# Patient Record
Sex: Male | Born: 1945
Health system: Southern US, Community
[De-identification: ages and names within clinical notes are randomized; demographics above are authoritative.]

## PROBLEM LIST (undated history)

## (undated) DIAGNOSIS — Z7901 Long term (current) use of anticoagulants: Secondary | ICD-10-CM

## (undated) DIAGNOSIS — I10 Essential (primary) hypertension: Secondary | ICD-10-CM

## (undated) DIAGNOSIS — D61818 Other pancytopenia: Secondary | ICD-10-CM

## (undated) DIAGNOSIS — I712 Thoracic aortic aneurysm, without rupture: Secondary | ICD-10-CM

## (undated) DIAGNOSIS — N200 Calculus of kidney: Secondary | ICD-10-CM

## (undated) DIAGNOSIS — D649 Anemia, unspecified: Secondary | ICD-10-CM

## (undated) DIAGNOSIS — T7840XA Allergy, unspecified, initial encounter: Secondary | ICD-10-CM

## (undated) DIAGNOSIS — M62830 Muscle spasm of back: Secondary | ICD-10-CM

## (undated) DIAGNOSIS — Q231 Congenital insufficiency of aortic valve: Secondary | ICD-10-CM

## (undated) DIAGNOSIS — Q2381 Bicuspid aortic valve: Secondary | ICD-10-CM

## (undated) HISTORY — DX: Allergy, unspecified, initial encounter: T78.40XA

## (undated) HISTORY — PX: SHOULDER SURGERY: SHX246

## (undated) HISTORY — PX: OTHER SURGICAL HISTORY: SHX169

## (undated) HISTORY — DX: Muscle spasm of back: M62.830

## (undated) HISTORY — PX: COLONOSCOPY: SHX174

## (undated) HISTORY — DX: Congenital insufficiency of aortic valve: Q23.1

## (undated) HISTORY — DX: Anemia, unspecified: D64.9

## (undated) HISTORY — DX: Calculus of kidney: N20.0

## (undated) HISTORY — DX: Other pancytopenia: D61.818

## (undated) HISTORY — DX: Bicuspid aortic valve: Q23.81

## (undated) HISTORY — PX: EYE SURGERY: SHX253

## (undated) HISTORY — DX: Essential (primary) hypertension: I10

---

## 2004-03-23 ENCOUNTER — Ambulatory Visit: Payer: Self-pay | Admitting: Internal Medicine

## 2004-04-02 ENCOUNTER — Ambulatory Visit: Payer: Self-pay | Admitting: Internal Medicine

## 2004-04-02 ENCOUNTER — Ambulatory Visit (HOSPITAL_COMMUNITY): Admission: RE | Admit: 2004-04-02 | Discharge: 2004-04-02 | Payer: Self-pay | Admitting: Internal Medicine

## 2005-07-08 ENCOUNTER — Ambulatory Visit (HOSPITAL_COMMUNITY): Admission: RE | Admit: 2005-07-08 | Discharge: 2005-07-08 | Payer: Self-pay | Admitting: Specialist

## 2005-11-02 ENCOUNTER — Emergency Department (HOSPITAL_COMMUNITY): Admission: EM | Admit: 2005-11-02 | Discharge: 2005-11-02 | Payer: Self-pay | Admitting: Emergency Medicine

## 2006-05-27 ENCOUNTER — Emergency Department (HOSPITAL_COMMUNITY): Admission: EM | Admit: 2006-05-27 | Discharge: 2006-05-27 | Payer: Self-pay | Admitting: Emergency Medicine

## 2006-09-25 ENCOUNTER — Ambulatory Visit (HOSPITAL_COMMUNITY): Admission: RE | Admit: 2006-09-25 | Discharge: 2006-09-25 | Payer: Self-pay | Admitting: Orthopaedic Surgery

## 2007-02-22 HISTORY — PX: BACK SURGERY: SHX140

## 2008-02-11 ENCOUNTER — Encounter: Payer: Self-pay | Admitting: Pulmonary Disease

## 2008-02-11 ENCOUNTER — Inpatient Hospital Stay (HOSPITAL_COMMUNITY): Admission: AD | Admit: 2008-02-11 | Discharge: 2008-02-14 | Payer: Self-pay | Admitting: Specialist

## 2008-02-22 DIAGNOSIS — I712 Thoracic aortic aneurysm, without rupture: Secondary | ICD-10-CM

## 2008-02-22 DIAGNOSIS — I7121 Aneurysm of the ascending aorta, without rupture: Secondary | ICD-10-CM

## 2008-02-22 HISTORY — DX: Aneurysm of the ascending aorta, without rupture: I71.21

## 2008-02-22 HISTORY — DX: Thoracic aortic aneurysm, without rupture: I71.2

## 2008-02-22 HISTORY — PX: CARDIAC VALVE REPLACEMENT: SHX585

## 2008-03-10 ENCOUNTER — Emergency Department (HOSPITAL_COMMUNITY): Admission: EM | Admit: 2008-03-10 | Discharge: 2008-03-10 | Payer: Self-pay | Admitting: Emergency Medicine

## 2008-03-13 ENCOUNTER — Ambulatory Visit (HOSPITAL_COMMUNITY): Admission: RE | Admit: 2008-03-13 | Discharge: 2008-03-13 | Payer: Self-pay | Admitting: Internal Medicine

## 2008-04-12 ENCOUNTER — Emergency Department (HOSPITAL_COMMUNITY): Admission: EM | Admit: 2008-04-12 | Discharge: 2008-04-12 | Payer: Self-pay | Admitting: Emergency Medicine

## 2008-04-16 ENCOUNTER — Ambulatory Visit (HOSPITAL_COMMUNITY): Admission: RE | Admit: 2008-04-16 | Discharge: 2008-04-17 | Payer: Self-pay | Admitting: Urology

## 2008-04-16 ENCOUNTER — Encounter: Payer: Self-pay | Admitting: Emergency Medicine

## 2008-04-19 ENCOUNTER — Emergency Department (HOSPITAL_COMMUNITY): Admission: EM | Admit: 2008-04-19 | Discharge: 2008-04-20 | Payer: Self-pay | Admitting: Emergency Medicine

## 2008-07-07 ENCOUNTER — Ambulatory Visit: Payer: Self-pay | Admitting: Cardiology

## 2008-07-07 ENCOUNTER — Ambulatory Visit (HOSPITAL_COMMUNITY): Admission: RE | Admit: 2008-07-07 | Discharge: 2008-07-07 | Payer: Self-pay | Admitting: Cardiology

## 2008-07-08 ENCOUNTER — Encounter: Payer: Self-pay | Admitting: Cardiology

## 2008-07-08 ENCOUNTER — Ambulatory Visit: Payer: Self-pay | Admitting: Cardiovascular Disease

## 2008-07-08 ENCOUNTER — Ambulatory Visit (HOSPITAL_COMMUNITY): Admission: RE | Admit: 2008-07-08 | Discharge: 2008-07-08 | Payer: Self-pay | Admitting: Cardiology

## 2008-07-11 ENCOUNTER — Ambulatory Visit: Payer: Self-pay | Admitting: Cardiology

## 2008-07-11 ENCOUNTER — Inpatient Hospital Stay (HOSPITAL_BASED_OUTPATIENT_CLINIC_OR_DEPARTMENT_OTHER): Admission: RE | Admit: 2008-07-11 | Discharge: 2008-07-11 | Payer: Self-pay | Admitting: Cardiology

## 2008-07-15 ENCOUNTER — Ambulatory Visit: Payer: Self-pay | Admitting: Surgery

## 2008-07-15 ENCOUNTER — Encounter: Payer: Self-pay | Admitting: Cardiology

## 2008-07-18 ENCOUNTER — Encounter: Payer: Self-pay | Admitting: Surgery

## 2008-07-18 ENCOUNTER — Ambulatory Visit: Payer: Self-pay | Admitting: *Deleted

## 2008-07-18 ENCOUNTER — Ambulatory Visit (HOSPITAL_COMMUNITY): Admission: RE | Admit: 2008-07-18 | Discharge: 2008-07-18 | Payer: Self-pay | Admitting: Surgery

## 2008-07-25 ENCOUNTER — Ambulatory Visit: Payer: Self-pay | Admitting: Surgery

## 2008-07-25 ENCOUNTER — Encounter: Payer: Self-pay | Admitting: Surgery

## 2008-07-25 ENCOUNTER — Inpatient Hospital Stay (HOSPITAL_COMMUNITY): Admission: RE | Admit: 2008-07-25 | Discharge: 2008-07-30 | Payer: Self-pay | Admitting: Surgery

## 2008-08-04 ENCOUNTER — Ambulatory Visit: Payer: Self-pay | Admitting: Cardiology

## 2008-08-08 DIAGNOSIS — I10 Essential (primary) hypertension: Secondary | ICD-10-CM | POA: Insufficient documentation

## 2008-08-08 DIAGNOSIS — N200 Calculus of kidney: Secondary | ICD-10-CM | POA: Insufficient documentation

## 2008-08-11 ENCOUNTER — Encounter: Payer: Self-pay | Admitting: Physician Assistant

## 2008-08-11 ENCOUNTER — Encounter (INDEPENDENT_AMBULATORY_CARE_PROVIDER_SITE_OTHER): Payer: Self-pay | Admitting: *Deleted

## 2008-08-11 ENCOUNTER — Ambulatory Visit: Payer: Self-pay | Admitting: Physician Assistant

## 2008-08-18 ENCOUNTER — Encounter: Payer: Self-pay | Admitting: Cardiology

## 2008-08-18 ENCOUNTER — Encounter: Admission: RE | Admit: 2008-08-18 | Discharge: 2008-08-18 | Payer: Self-pay | Admitting: Surgery

## 2008-08-18 ENCOUNTER — Ambulatory Visit: Payer: Self-pay | Admitting: Surgery

## 2008-08-26 ENCOUNTER — Ambulatory Visit: Payer: Self-pay | Admitting: Cardiovascular Disease

## 2008-09-15 ENCOUNTER — Ambulatory Visit: Payer: Self-pay | Admitting: Cardiology

## 2008-10-06 ENCOUNTER — Encounter: Payer: Self-pay | Admitting: *Deleted

## 2008-10-20 ENCOUNTER — Ambulatory Visit: Payer: Self-pay | Admitting: Cardiology

## 2008-11-06 ENCOUNTER — Ambulatory Visit (HOSPITAL_COMMUNITY): Admission: RE | Admit: 2008-11-06 | Discharge: 2008-11-06 | Payer: Self-pay | Admitting: Ophthalmology

## 2008-11-10 ENCOUNTER — Ambulatory Visit: Payer: Self-pay | Admitting: Cardiology

## 2008-11-20 ENCOUNTER — Ambulatory Visit (HOSPITAL_COMMUNITY): Admission: RE | Admit: 2008-11-20 | Discharge: 2008-11-20 | Payer: Self-pay | Admitting: Ophthalmology

## 2008-12-11 ENCOUNTER — Ambulatory Visit: Payer: Self-pay | Admitting: Cardiology

## 2008-12-11 LAB — CONVERTED CEMR LAB: POC INR: 1.8

## 2008-12-18 ENCOUNTER — Ambulatory Visit: Payer: Self-pay | Admitting: Cardiology

## 2009-01-07 ENCOUNTER — Ambulatory Visit: Payer: Self-pay | Admitting: Cardiology

## 2009-02-12 ENCOUNTER — Ambulatory Visit: Payer: Self-pay | Admitting: Cardiology

## 2009-02-12 LAB — CONVERTED CEMR LAB: POC INR: 3.3

## 2009-02-18 ENCOUNTER — Ambulatory Visit (HOSPITAL_COMMUNITY): Admission: RE | Admit: 2009-02-18 | Discharge: 2009-02-18 | Payer: Self-pay | Admitting: Internal Medicine

## 2009-03-13 ENCOUNTER — Ambulatory Visit: Payer: Self-pay | Admitting: Cardiology

## 2009-03-13 LAB — CONVERTED CEMR LAB: POC INR: 3.5

## 2009-04-07 ENCOUNTER — Ambulatory Visit: Payer: Self-pay | Admitting: Cardiology

## 2009-04-07 LAB — CONVERTED CEMR LAB: POC INR: 2.9

## 2009-05-08 ENCOUNTER — Ambulatory Visit: Payer: Self-pay | Admitting: Cardiology

## 2009-05-14 ENCOUNTER — Telehealth: Payer: Self-pay | Admitting: Cardiology

## 2009-05-14 ENCOUNTER — Encounter (INDEPENDENT_AMBULATORY_CARE_PROVIDER_SITE_OTHER): Payer: Self-pay | Admitting: *Deleted

## 2009-05-20 ENCOUNTER — Telehealth (INDEPENDENT_AMBULATORY_CARE_PROVIDER_SITE_OTHER): Payer: Self-pay | Admitting: *Deleted

## 2009-06-04 ENCOUNTER — Telehealth (INDEPENDENT_AMBULATORY_CARE_PROVIDER_SITE_OTHER): Payer: Self-pay | Admitting: *Deleted

## 2009-06-11 ENCOUNTER — Ambulatory Visit: Payer: Self-pay | Admitting: Cardiology

## 2009-06-18 ENCOUNTER — Ambulatory Visit: Payer: Self-pay | Admitting: Cardiology

## 2009-06-18 LAB — CONVERTED CEMR LAB: POC INR: 4.4

## 2009-07-06 ENCOUNTER — Ambulatory Visit: Payer: Self-pay | Admitting: Cardiology

## 2009-07-06 DIAGNOSIS — Z952 Presence of prosthetic heart valve: Secondary | ICD-10-CM | POA: Insufficient documentation

## 2009-07-06 DIAGNOSIS — R002 Palpitations: Secondary | ICD-10-CM | POA: Insufficient documentation

## 2009-07-06 DIAGNOSIS — R42 Dizziness and giddiness: Secondary | ICD-10-CM | POA: Insufficient documentation

## 2009-07-06 DIAGNOSIS — R55 Syncope and collapse: Secondary | ICD-10-CM | POA: Insufficient documentation

## 2009-07-07 ENCOUNTER — Encounter: Payer: Self-pay | Admitting: Adult Health

## 2009-07-08 ENCOUNTER — Ambulatory Visit: Payer: Self-pay | Admitting: Cardiology

## 2009-07-08 ENCOUNTER — Ambulatory Visit (HOSPITAL_COMMUNITY): Admission: RE | Admit: 2009-07-08 | Discharge: 2009-07-08 | Payer: Self-pay | Admitting: Cardiology

## 2009-07-09 ENCOUNTER — Encounter: Payer: Self-pay | Admitting: Cardiology

## 2009-07-10 ENCOUNTER — Ambulatory Visit: Payer: Self-pay | Admitting: Cardiology

## 2009-07-15 ENCOUNTER — Telehealth: Payer: Self-pay | Admitting: Cardiology

## 2009-07-16 ENCOUNTER — Ambulatory Visit: Payer: Self-pay | Admitting: Cardiology

## 2009-07-23 ENCOUNTER — Ambulatory Visit: Payer: Self-pay | Admitting: Cardiology

## 2009-08-10 ENCOUNTER — Ambulatory Visit: Payer: Self-pay | Admitting: Cardiovascular Disease

## 2009-08-28 ENCOUNTER — Ambulatory Visit: Payer: Self-pay | Admitting: Cardiology

## 2009-09-25 ENCOUNTER — Ambulatory Visit: Payer: Self-pay | Admitting: Cardiology

## 2009-10-22 ENCOUNTER — Ambulatory Visit: Payer: Self-pay | Admitting: Cardiology

## 2009-10-22 LAB — CONVERTED CEMR LAB: POC INR: 2.3

## 2009-11-19 ENCOUNTER — Ambulatory Visit: Payer: Self-pay | Admitting: Cardiology

## 2009-12-18 ENCOUNTER — Ambulatory Visit: Payer: Self-pay | Admitting: Cardiology

## 2009-12-18 LAB — CONVERTED CEMR LAB: POC INR: 2.2

## 2010-01-01 ENCOUNTER — Ambulatory Visit: Payer: Self-pay | Admitting: Cardiology

## 2010-01-01 LAB — CONVERTED CEMR LAB: POC INR: 3.6

## 2010-01-22 ENCOUNTER — Ambulatory Visit: Payer: Self-pay | Admitting: Cardiology

## 2010-01-22 LAB — CONVERTED CEMR LAB: POC INR: 2.5

## 2010-02-19 ENCOUNTER — Ambulatory Visit: Payer: Self-pay | Admitting: Cardiology

## 2010-03-19 ENCOUNTER — Ambulatory Visit
Admission: RE | Admit: 2010-03-19 | Discharge: 2010-03-19 | Payer: Self-pay | Source: Home / Self Care | Attending: Cardiology | Admitting: Cardiology

## 2010-03-23 NOTE — Medication Information (Signed)
Summary: ccr-lr  Anticoagulant Therapy  Managed by: Vashti Hey, RN PCP: Forest Becker Supervising MD: Diona Browner MD, Remi Deter Indication 1: Aortic Valve Replacement (ICD-V43.3) Lab Used: [facility] [place] Site: [place] INR POC 3.5  Dietary changes: no    Health status changes: no    Bleeding/hemorrhagic complications: no    Recent/future hospitalizations: no    Any changes in medication regimen? no    Recent/future dental: no  Any missed doses?: no       Is patient compliant with meds? yes       Allergies: 1)  ! Codeine  Anticoagulation Management History:      The patient is taking warfarin and comes in today for a routine follow up visit.  Negative risk factors for bleeding include an age less than 40 years old.  The bleeding index is 'low risk'.  Positive CHADS2 values include History of HTN.  Negative CHADS2 values include Age > 74 years old.  The start date was 08/04/2008.  Anticoagulation responsible provider: Diona Browner MD, Remi Deter.  INR POC: 3.5.  Cuvette Lot#: [id number].  Exp: 10/11.    Anticoagulation Management Assessment/Plan:      The patient's current anticoagulation dose is Warfarin sodium 10 mg tabs: Use as directed by Anticoagulation Clinic.  The target INR is 2.5 - 3.5.  The next INR is due 04/10/2009.  Anticoagulation instructions were given to patient.  Results were reviewed/authorized by Vashti Hey, RN.  He was notified by Vashti Hey RN.         Prior Anticoagulation Instructions: INR 3.3 Continue coumadin 5mg  once daily except 10mg  on Wednesdays  Current Anticoagulation Instructions: INR 3.5 Continue coumadin 5mg  once daily except 10mg  on Wednesdays

## 2010-03-23 NOTE — Medication Information (Signed)
Summary: ccr-lr  Anticoagulant Therapy  Managed by: Vashti Hey, RN PCP: Forest Becker Supervising MD: Dietrich Pates MD, Molly Maduro Indication 1: Aortic Valve Replacement (ICD-V43.3) Lab Used: [facility] [place] Site: [place] INR POC 4.4  Dietary changes: no    Health status changes: no    Bleeding/hemorrhagic complications: no    Recent/future hospitalizations: no    Any changes in medication regimen? no    Recent/future dental: no  Any missed doses?: no       Is patient compliant with meds? yes       Allergies: 1)  ! Codeine  Anticoagulation Management History:      The patient is taking warfarin and comes in today for a routine follow up visit.  Negative risk factors for bleeding include an age less than 63 years old.  The bleeding index is 'low risk'.  Positive CHADS2 values include History of HTN.  Negative CHADS2 values include Age > 69 years old.  The start date was 08/04/2008.  Anticoagulation responsible provider: Dietrich Pates MD, Molly Maduro.  INR POC: 4.4.  Cuvette Lot#: [id number].  Exp: 10/11.    Anticoagulation Management Assessment/Plan:      The patient's current anticoagulation dose is Warfarin sodium 10 mg tabs: Use as directed by Anticoagulation Clinic.  The target INR is 2.5 - 3.5.  The next INR is due 07/06/2009.  Anticoagulation instructions were given to patient.  Results were reviewed/authorized by Vashti Hey, RN.  He was notified by Vashti Hey RN.         Prior Anticoagulation Instructions: INR 1.7 Take coumadin 1 tablet tonight and tomorrow night then resume 1/2 tablet once daily except 1 tablet on Wednesdays  Current Anticoagulation Instructions: INR 4.4 Hold coumadin tonight  then resume 5mg  once daily except 10mg  on Wednesdays

## 2010-03-23 NOTE — Medication Information (Signed)
Summary: ccr-lr  Anticoagulant Therapy  Managed by: Vashti Hey, RN PCP: Forest Becker Supervising MD: Dietrich Pates MD, Molly Maduro Indication 1: Aortic Valve Replacement (ICD-V43.3) Lab Used: [facility] [place] Site: [place] INR POC 2.6  Dietary changes: no    Health status changes: no    Bleeding/hemorrhagic complications: no    Recent/future hospitalizations: no    Any changes in medication regimen? no    Recent/future dental: no  Any missed doses?: no       Is patient compliant with meds? yes       Allergies: 1)  ! Codeine  Anticoagulation Management History:      The patient is taking warfarin and comes in today for a routine follow up visit.  Negative risk factors for bleeding include an age less than 57 years old.  The bleeding index is 'low risk'.  Positive CHADS2 values include History of HTN.  Negative CHADS2 values include Age > 65 years old.  The start date was 08/04/2008.  Anticoagulation responsible provider: Dietrich Pates MD, Molly Maduro.  INR POC: 2.6.  Cuvette Lot#: [id number].  Exp: 10/2010.    Anticoagulation Management Assessment/Plan:      The patient's current anticoagulation dose is Warfarin sodium 10 mg tabs: Use as directed by Anticoagulation Clinic.  The target INR is 2.5 - 3.5.  The next INR is due 12/17/2009.  Anticoagulation instructions were given to patient.  Results were reviewed/authorized by Vashti Hey, RN.  He was notified by Vashti Hey RN.         Prior Anticoagulation Instructions: INR 2.3 Take coumadin 1 tablet tonight then resume 1/2 tablet once daily except 1 tablet on Wednesdays  Current Anticoagulation Instructions: INR 2.6 Continue coumadin 1/2 tablet once daily except 1 tablet on Wednesdays

## 2010-03-23 NOTE — Medication Information (Signed)
Summary: ccr-lr  Anticoagulant Therapy  Managed by: Weston Brass, PharmD PCP: Forest Becker Supervising MD: Eden Emms MD, Theron Arista Indication 1: Aortic Valve Replacement (ICD-V43.3) Lab Used: [facility] [place] Site: [place] INR POC 3.0  Dietary changes: no    Health status changes: yes       Details: pt having recurrent chest pains for several months.  Has been seeing MD for this.  Had an episode while in clinic today.  BP was 124/67.  HR 82.  Often associated with diaphoresis and sometimes nausea.  Dr. Eden Emms reviewed pt information.  Negative CAD.  Has appt with Dr. Daleen Squibb in 2 weeks.    Bleeding/hemorrhagic complications: no    Recent/future hospitalizations: no    Any changes in medication regimen? no    Recent/future dental: no  Any missed doses?: yes     Details: was off for colonoscopy on 6/8.    Is patient compliant with meds? yes       Allergies: 1)  ! Codeine  Anticoagulation Management History:      The patient is taking warfarin and comes in today for a routine follow up visit.  Negative risk factors for bleeding include an age less than 76 years old.  The bleeding index is 'low risk'.  Positive CHADS2 values include History of HTN.  Negative CHADS2 values include Age > 59 years old.  The start date was 08/04/2008.  Anticoagulation responsible provider: Eden Emms MD, Theron Arista.  INR POC: 3.0.  Cuvette Lot#: [id number].  Exp: 09/2010.    Anticoagulation Management Assessment/Plan:      The patient's current anticoagulation dose is Warfarin sodium 10 mg tabs: Use as directed by Anticoagulation Clinic.  The target INR is 2.5 - 3.5.  The next INR is due 08/28/2009.  Anticoagulation instructions were given to patient.  Results were reviewed/authorized by Weston Brass, PharmD.  He was notified by Weston Brass PharmD.         Prior Anticoagulation Instructions: INR 3.6 Continue coumadin 5mg  once daily except 10mg  on Wednesdays Increase greens  Current  Anticoagulation Instructions: INR 3.0  Continue same dose of 1/2 tablet every day except 1 tablet on Wednesday.

## 2010-03-23 NOTE — Progress Notes (Signed)
Summary: Okay to hold Coumadin  Phone Note From Other Clinic Call back at [phone]   Caller: Dewayne Hatch @ Dr.Rehman's office Summary of Call: need to know if it is okay with Dr.Zyere Jiminez for pt to come off of Coumadin for 5 days prior to Colonoscopy to be done on 07/29/09/tg Initial call taken by: Raechel Ache [facility],  Jul 15, 2009 11:10 AM  Follow-up for Phone Call        Low risk of stroke off coumadin. Lets make it 3 days instead of 5. Restart ASAP with followup with you Misty Stanley. Follow-up by: Gaylord Shih, MD, [facility],  Jul 16, 2009 9:47 AM     Appended Document: Molli Knock to hold Coumadin Ann @ Dr Patty Sermons office informed.

## 2010-03-23 NOTE — Assessment & Plan Note (Signed)
Summary: 1 mth f/u per checkout on 5/26./11/tg   Visit Type:  Follow-up Referring Provider:  Rothbart Primary Provider:  Forest Becker  CC:  some dizzy spells .  History of Present Illness: Stephen Moses returns today for further evaluation and management of intermittent dizziness, presyncope, and palpitations. Please see the extensive note and Holter monitor results from his last visit with Ms. Lawrence.  He does get dizzy on occasion when he stands up. He is having some atypical chest pain but has clean coronaries. His Coumadin is therapeutic today at 3.0. He is a little over a year out from aortic valve replacement and aortic root replacement. He is followed by Dr. [name].  He states his urine is mostly yellow or dark. He is drinking more recreational drank such as Powerade as opposed to free water.  Current Medications (verified): 1)  Warfarin Sodium 10 Mg Tabs (Warfarin Sodium) .... Use As Directed By Anticoagulation Clinic 2)  Surgical Clearance .... Cardiac Clearance For Surgery [name] Dr. Shelle Iron, But Pt Will Need Anticoagulation 3)  Nitrostat 0.4 Mg Subl (Nitroglycerin) .[name].. 1 Tablet Under Tongue At Onset of Chest Pain; You May Repeat Every 5 Minutes For Up To 3 Doses. 4)  Metoprolol Succinate 25 Mg Xr24h-Tab (Metoprolol Succinate) .... Take One Tablet By Mouth Daily 5)  Endocet 5-325 Mg Tabs (Oxycodone-Acetaminophen) .... Take Every 6 Hrs As Needed For Pain  Allergies (verified): 1)  ! Codeine  Past History:  Past Medical History: Last updated: 07/06/2009 Current Problems:  HYPERTENSION (ICD-401.9) RENAL CALCULUS (ICD-592.0) Paraspinal spams Dizziness  Past Surgical History: Last updated: August 09, 2008 right shoulder surgery back surgery (2009) colonoscopy  Family History: Last updated: Aug 09, 2008 Father:deceased (murdered) Mother:deceased due to natural causes  Social History: Last updated: Aug 09, 2008 Full Time Divorced  Tobacco Use - No.  Alcohol Use - no Regular  Exercise - no Drug Use - no 4 deceased brothers 1 leukemia infarction 1lung cancer1colon cance 5 sisters alive and well 1 has leukemia  Risk Factors: Exercise: no (09-Aug-2008)  Risk Factors: Smoking Status: never (08-09-08)  Review of Systems       negative other than history of present illness  Vital Signs:  Patient profile:   65 year old male Weight:      174 pounds Pulse rate:   72 / minute BP sitting:   122 / 81  (right arm)  Vitals Entered By: Dreama Saa, CNA (August 28, 2009 1:11 PM)  Physical Exam  General:  Well developed, well nourished, in no acute distress. Head:  normocephalic and atraumatic Eyes:  PERRLA/EOM intact; conjunctiva and lids normal. Mouth:  Teeth, gums and palate normal. Oral mucosa normal. Neck:  Neck supple, no JVD. No masses, thyromegaly or abnormal cervical nodes. Heart:  PMI nondisplaced, regular rate and rhythm, prosthetic S2, no diastolic murmur Msk:  Back normal, normal gait. Muscle strength and tone normal. Pulses:  pulses normal in all 4 extremities Extremities:  No clubbing or cyanosis. Neurologic:  Alert and oriented x 3. Skin:  Intact without lesions or rashes. Psych:  Normal affect.   Impression & Recommendations:  Problem # 1:  PALPITATIONS (ICD-785.1) Assessment Improved These have improved on low dose beta blocker. No change. His updated medication list for this problem includes:    Warfarin Sodium 10 Mg Tabs (Warfarin sodium) ..... Use as directed by anticoagulation clinic    Nitrostat 0.4 Mg Subl (Nitroglycerin) .[name]... 1 tablet under tongue at onset of chest pain; you may repeat every 5 minutes for up to  3 doses.    Metoprolol Succinate 25 Mg Xr24h-tab (Metoprolol succinate) .[name]... Take one tablet by mouth daily  Problem # 2:  DIZZINESS (ICD-780.4) Assessment: Improved I think some of this is orthostatic by history. I've asked to stay well hydrated drinking more with free water. I've asked her to keep his  urine clear as a gross estimate of hydration.  Problem # 3:  AORTIC VALVE REPLACEMENT, HX OF (ICD-V43.3) Assessment: Unchanged  Problem # 4:  HYPERTENSION (ICD-401.9) Assessment: Improved  His updated medication list for this problem includes:    Metoprolol Succinate 25 Mg Xr24h-tab (Metoprolol succinate) .[name]... Take one tablet by mouth daily  Patient Instructions: 1)  Your physician recommends that you schedule a follow-up appointment in: 1 years 2)  ***please increase your fluid intake*** 3)  Your physician recommends that you continue on your current medications as directed. Please refer to the Current Medication list given to you today.

## 2010-03-23 NOTE — Medication Information (Signed)
Summary: ccr-lr  Anticoagulant Therapy  Managed by: Vashti Hey, RN PCP: Forest Becker Supervising MD: Dietrich Pates MD, Molly Maduro Indication 1: Aortic Valve Replacement (ICD-V43.3) Lab Used: [facility] [place] Site:   Dietary changes: no    Health status changes: no    Bleeding/hemorrhagic complications: no    Recent/future hospitalizations: no    Any changes in medication regimen? no    Recent/future dental: no  Any missed doses?: no       Is patient compliant with meds? yes       Allergies: 1)  ! Codeine  Anticoagulation Management History:      The patient is taking warfarin and comes in today for a routine follow up visit.  Negative risk factors for bleeding include an age less than 24 years old.  The bleeding index is 'low risk'.  Positive CHADS2 values include History of HTN.  Negative CHADS2 values include Age > 36 years old.  The start date was 08/04/2008.  Anticoagulation responsible provider: Dietrich Pates MD, Molly Maduro.  Cuvette Lot#: [id number].  Exp: 10/11.    Anticoagulation Management Assessment/Plan:      The patient's current anticoagulation dose is Warfarin sodium 10 mg tabs: Use as directed by Anticoagulation Clinic.  The target INR is 2.5 - 3.5.  The next INR is due 07/27/2009.  Anticoagulation instructions were given to patient.  Results were reviewed/authorized by Vashti Hey, RN.  He was notified by Vashti Hey RN.         Prior Anticoagulation Instructions: INR 4.4 Hold coumadin tonight  then resume 5mg  once daily except 10mg  on Wednesdays  Current Anticoagulation Instructions: INR 3.6 Continue coumadin 5mg  once daily except 10mg  on Wednesdays Increase greens

## 2010-03-23 NOTE — Medication Information (Signed)
Summary: CCR 4:30 TAMMY WILL GET IF LISA LEAVES  Anticoagulant Therapy  Managed by: Vashti Hey, RN PCP: Gala Lewandowsky Rayburn Ma Supervising MD: Dietrich Pates MD, Molly Maduro Indication 1: Aortic Valve Replacement (ICD-V43.3) Lab Used: [facility] [place] Site: [place] INR POC 2.3  Dietary changes: no    Health status changes: no    Bleeding/hemorrhagic complications: no    Recent/future hospitalizations: no    Any changes in medication regimen? no    Recent/future dental: no  Any missed doses?: no       Is patient compliant with meds? yes       Allergies: 1)  ! Codeine  Anticoagulation Management History:      The patient is taking warfarin and comes in today for a routine follow up visit.  Negative risk factors for bleeding include an age less than 67 years old.  The bleeding index is 'low risk'.  Positive CHADS2 values include History of HTN.  Negative CHADS2 values include Age > 51 years old.  The start date was 08/04/2008.  Anticoagulation responsible provider: Dietrich Pates MD, Molly Maduro.  INR POC: 2.3.  Cuvette Lot#: [id number].  Exp: 10/2010.    Anticoagulation Management Assessment/Plan:      The patient's current anticoagulation dose is Warfarin sodium 10 mg tabs: Use as directed by Anticoagulation Clinic.  The target INR is 2.5 - 3.5.  The next INR is due 11/19/2009.  Anticoagulation instructions were given to patient.  Results were reviewed/authorized by Vashti Hey, RN.  He was notified by Vashti Hey RN.         Prior Anticoagulation Instructions: INR 2.8 TODAY CONTINUE CURRENT COUMADIN REGIMEN 1 TABLET ON WEDNESDAYS AND 1/2 TABLET ALL OTHER DAYS  Current Anticoagulation Instructions: INR 2.3 Take coumadin 1 tablet tonight then resume 1/2 tablet once daily except 1 tablet on Wednesdays

## 2010-03-23 NOTE — Letter (Signed)
Summary:  Results Engineer, agricultural at [facility]  [address], [place] [address]   Phone: [phone]  Fax: [phone]      Jul 09, 2009 MRN: [id number]   TOLBERT MATHESON [address] [place], [place]  [address]   Dear Mr. CARDIN,  Your test ordered by Selena Batten has been reviewed by your physician (or physician assistant) and was found to be normal or stable. Your physician (or physician assistant) felt no changes were needed at this time.  __X__ Echocardiogram  ____ Cardiac Stress Test  ____ Lab Work  ____ Peripheral vascular study of arms, legs or neck  ____ CT scan or X-ray  ____ Lung or Breathing test  ____ Other: Please continue on current medical treatment.  Thank you.   Colfax Bing, MD, F.A.C.C

## 2010-03-23 NOTE — Medication Information (Signed)
Summary: ccr-lr  Anticoagulant Therapy  Managed by: Teressa Lower, RN PCP: Gala Lewandowsky Rayburn Ma Supervising MD: Dietrich Pates MD, Molly Maduro Indication 1: Aortic Valve Replacement (ICD-V43.3) Lab Used: [facility] [place] Site: [place] INR POC 2.2  Dietary changes: no    Health status changes: no    Bleeding/hemorrhagic complications: no    Recent/future hospitalizations: no    Any changes in medication regimen? no    Recent/future dental: no  Any missed doses?: no       Is patient compliant with meds? yes       Current Medications (verified): 1)  Warfarin Sodium 10 Mg Tabs (Warfarin Sodium) .... Use As Directed By Anticoagulation Clinic 2)  Surgical Clearance .... Cardiac Clearance For Surgery [name] Dr. Shelle Iron, But Pt Will Need Anticoagulation 3)  Nitrostat 0.4 Mg Subl (Nitroglycerin) .[name].. 1 Tablet Under Tongue At Onset of Chest Pain; You May Repeat Every 5 Minutes For Up To 3 Doses. 4)  Metoprolol Succinate 25 Mg Xr24h-Tab (Metoprolol Succinate) .... Take One Tablet By Mouth Daily 5)  Endocet 5-325 Mg Tabs (Oxycodone-Acetaminophen) .... Take Every 6 Hrs As Needed For Pain  Allergies (verified): 1)  ! Codeine  Anticoagulation Management History:      The patient is taking warfarin and comes in today for a routine follow up visit.  Negative risk factors for bleeding include an age less than 29 years old.  The bleeding index is 'low risk'.  Positive CHADS2 values include History of HTN.  Negative CHADS2 values include Age > 25 years old.  The start date was 08/04/2008.  Anticoagulation responsible provider: Dietrich Pates MD, Molly Maduro.  INR POC: 2.2.  Cuvette Lot#: [id number].  Exp: 10/2010.    Anticoagulation Management Assessment/Plan:      The patient's current anticoagulation dose is Warfarin sodium 10 mg tabs: Use as directed by Anticoagulation Clinic.  The target INR is 2.5 - 3.5.  The next INR is due 01/01/2010.  Anticoagulation instructions were given to patient.   Results were reviewed/authorized by Teressa Lower, RN.  He was notified by Teressa Lower RN.         Prior Anticoagulation Instructions: INR 2.6 Continue coumadin 1/2 tablet once daily except 1 tablet on Wednesdays  Current Anticoagulation Instructions: INR 2.2 TODAY INCREASE WARFARIN DOSE TO 1 TABLET ON WEDNESDAYS AND FRIDAYS AND 1/2 TABLET ALL OTHER DAYS RECHECK IN 2 WEEKS

## 2010-03-23 NOTE — Medication Information (Signed)
Summary: PROTIME/TG  Anticoagulant Therapy  Managed by: Teressa Lower, RN PCP: Gala Lewandowsky Rayburn Ma Supervising MD: Dietrich Pates MD, Molly Maduro Indication 1: Aortic Valve Replacement (ICD-V43.3) Lab Used: [facility] [place] Site: [place] INR POC 2.5  Dietary changes: no    Health status changes: no    Bleeding/hemorrhagic complications: no    Recent/future hospitalizations: no    Any changes in medication regimen? no    Recent/future dental: no  Any missed doses?: no       Is patient compliant with meds? yes       Current Medications (verified): 1)  Warfarin Sodium 10 Mg Tabs (Warfarin Sodium) .... Use As Directed By Anticoagulation Clinic 2)  Surgical Clearance .... Cardiac Clearance For Surgery [name] Dr. Shelle Iron, But Pt Will Need Anticoagulation 3)  Nitrostat 0.4 Mg Subl (Nitroglycerin) .[name].. 1 Tablet Under Tongue At Onset of Chest Pain; You May Repeat Every 5 Minutes For Up To 3 Doses. 4)  Metoprolol Succinate 25 Mg Xr24h-Tab (Metoprolol Succinate) .... Take One Tablet By Mouth Daily 5)  Endocet 5-325 Mg Tabs (Oxycodone-Acetaminophen) .... Take Every 6 Hrs As Needed For Pain  Allergies (verified): 1)  ! Codeine  Anticoagulation Management History:      The patient is taking warfarin and comes in today for a routine follow up visit.  Negative risk factors for bleeding include an age less than 80 years old.  The bleeding index is 'low risk'.  Positive CHADS2 values include History of HTN.  Negative CHADS2 values include Age > 65 years old.  The start date was 08/04/2008.  Anticoagulation responsible provider: Dietrich Pates MD, Molly Maduro.  INR POC: 2.5.  Cuvette Lot#: [id number].  Exp: 01/2011.    Anticoagulation Management Assessment/Plan:      The patient's current anticoagulation dose is Warfarin sodium 10 mg tabs: Use as directed by Anticoagulation Clinic.  The target INR is 2.5 - 3.5.  The next INR is due 02/19/2010.  Anticoagulation instructions were given to  patient.  Results were reviewed/authorized by Teressa Lower, RN.  He was notified by Teressa Lower RN.         Prior Anticoagulation Instructions: INR 3.6 TODAY CONTINUE CURRENT DOSE WARFARIN 1 TABLET ON WEDNESDAY AND FRIDAYS AND 0.5 TABLET ALL OTHER DAYS   Current Anticoagulation Instructions: INR 2.5 TODAY CONTINUE CURRENT DOSE OF WARFARIN 1 TABLET ON WEDNESDAYS  AND FRIDAYS, 0.5 TABLET ALL OTHER DAYS

## 2010-03-23 NOTE — Progress Notes (Signed)
Summary: PT NEVER HAD SURGERY   Phone Note Call from Patient Call back at Home Phone [phone]   Caller: PT  Summary of Call: PT PUT OFF HAVING SHOULDER SURGERY BUT IS NOW READY TO HAVE IT. DOES HE NEED TO BE SEEN AGAIN OR IS DR. Elyana Grabski STILL OKAY WITH THE CLEARANCE GIVEN AT LAST OFFICE VISIT? Initial call taken by: Faythe Ghee,  May 14, 2009 10:13 AM  Follow-up for Phone Call        Cleared for surgery. Coumadin Clinic to handle anticoagulation. Do not feel he needs Lovenox. Follow-up by: Gaylord Shih, MD, [facility],  May 14, 2009 10:19 AM     Appended Document: PT NEVER HAD SURGERY  PT'S WIFE AWARE  OKAY FOR SX. WILL FAX CLEARANCE TO DR Charna Elizabeth AT PT REQUEST

## 2010-03-23 NOTE — Progress Notes (Signed)
Summary: change INR appt  Phone Note Call from Patient Call back at Home Phone [phone]   Caller: patient's wife Reason for Call: Talk to Nurse Summary of Call: wife called he is having surgery and they are wanting to reschedule his ccr appt Initial call taken by: Claudette Laws,  May 20, 2009 8:43 AM  Follow-up for Phone Call        Called back.  Spoke with Carney Bern.  Pt scheduled for shoulder surgery in [place] on 06/04/09.  Workman's Comp. case.  Pt will be stopping coumadin on 05/27/09 per surgeon.  Pt does not need Lovenox per Dr Daleen Squibb.  Pt's INR appt rescheduled for 06/09/09 post discharge from hospital.   Follow-up by: Vashti Hey RN,  May 20, 2009 9:13 AM

## 2010-03-23 NOTE — Medication Information (Signed)
Summary: 2 wk protime per checkout on 12/17/09/tg  Anticoagulant Therapy  Managed by: Teressa Lower, RN PCP: Gala Lewandowsky Rayburn Ma Supervising MD: Dietrich Pates MD, Molly Maduro Indication 1: Aortic Valve Replacement (ICD-V43.3) Lab Used: [facility] [place] Site: [place] INR POC 3.6  Dietary changes: no    Health status changes: no    Bleeding/hemorrhagic complications: no    Recent/future hospitalizations: no    Any changes in medication regimen? no    Recent/future dental: no  Any missed doses?: no       Is patient compliant with meds? yes       Current Medications (verified): 1)  Warfarin Sodium 10 Mg Tabs (Warfarin Sodium) .... Use As Directed By Anticoagulation Clinic 2)  Surgical Clearance .... Cardiac Clearance For Surgery [name] Dr. Shelle Iron, But Pt Will Need Anticoagulation 3)  Nitrostat 0.4 Mg Subl (Nitroglycerin) .[name].. 1 Tablet Under Tongue At Onset of Chest Pain; You May Repeat Every 5 Minutes For Up To 3 Doses. 4)  Metoprolol Succinate 25 Mg Xr24h-Tab (Metoprolol Succinate) .... Take One Tablet By Mouth Daily 5)  Endocet 5-325 Mg Tabs (Oxycodone-Acetaminophen) .... Take Every 6 Hrs As Needed For Pain  Allergies (verified): 1)  ! Codeine  Anticoagulation Management History:      The patient is taking warfarin and comes in today for a routine follow up visit.  Negative risk factors for bleeding include an age less than 35 years old.  The bleeding index is 'low risk'.  Positive CHADS2 values include History of HTN.  Negative CHADS2 values include Age > 98 years old.  The start date was 08/04/2008.  Anticoagulation responsible provider: Dietrich Pates MD, Molly Maduro.  INR POC: 3.6.  Cuvette Lot#: [id number].  Exp: 01/2011.    Anticoagulation Management Assessment/Plan:      The patient's current anticoagulation dose is Warfarin sodium 10 mg tabs: Use as directed by Anticoagulation Clinic.  The target INR is 2.5 - 3.5.  The next INR is due 01/22/2010.  Anticoagulation  instructions were given to patient.  Results were reviewed/authorized by Teressa Lower, RN.  He was notified by Teressa Lower RN.         Prior Anticoagulation Instructions: INR 2.2 TODAY INCREASE WARFARIN DOSE TO 1 TABLET ON WEDNESDAYS AND FRIDAYS AND 1/2 TABLET ALL OTHER DAYS RECHECK IN 2 WEEKS  Current Anticoagulation Instructions: INR 3.6 TODAY CONTINUE CURRENT DOSE WARFARIN 1 TABLET ON WEDNESDAY AND FRIDAYS AND 0.5 TABLET ALL OTHER DAYS

## 2010-03-23 NOTE — Medication Information (Signed)
Summary: protime per checkout on 08/28/09/tg  Anticoagulant Therapy  Managed by: Teressa Lower, RN PCP: Gala Lewandowsky Rayburn Ma Supervising MD: Eden Emms MD, Theron Arista Indication 1: Aortic Valve Replacement (ICD-V43.3) Lab Used: [facility] [place] Site: [place] INR POC 2.8  Dietary changes: no    Health status changes: no    Bleeding/hemorrhagic complications: no    Recent/future hospitalizations: no    Any changes in medication regimen? yes       Details: oxycodone prn  Recent/future dental: no  Any missed doses?: no       Is patient compliant with meds? yes       Current Medications (verified): 1)  Warfarin Sodium 10 Mg Tabs (Warfarin Sodium) .... Use As Directed By Anticoagulation Clinic 2)  Surgical Clearance .... Cardiac Clearance For Surgery [name] Dr. Shelle Iron, But Pt Will Need Anticoagulation 3)  Nitrostat 0.4 Mg Subl (Nitroglycerin) .[name].. 1 Tablet Under Tongue At Onset of Chest Pain; You May Repeat Every 5 Minutes For Up To 3 Doses. 4)  Metoprolol Succinate 25 Mg Xr24h-Tab (Metoprolol Succinate) .... Take One Tablet By Mouth Daily 5)  Endocet 5-325 Mg Tabs (Oxycodone-Acetaminophen) .... Take Every 6 Hrs As Needed For Pain  Allergies (verified): 1)  ! Codeine  Anticoagulation Management History:      The patient is taking warfarin and comes in today for a routine follow up visit.  Negative risk factors for bleeding include an age less than 34 years old.  The bleeding index is 'low risk'.  Positive CHADS2 values include History of HTN.  Negative CHADS2 values include Age > 73 years old.  The start date was 08/04/2008.  Anticoagulation responsible provider: Eden Emms MD, Theron Arista.  INR POC: 2.8.  Cuvette Lot#: [id number].  Exp: 10/2010.    Anticoagulation Management Assessment/Plan:      The patient's current anticoagulation dose is Warfarin sodium 10 mg tabs: Use as directed by Anticoagulation Clinic.  The target INR is 2.5 - 3.5.  The next INR is due 10/22/2009.   Anticoagulation instructions were given to patient.  Results were reviewed/authorized by Teressa Lower, RN.  He was notified by Teressa Lower RN.         Prior Anticoagulation Instructions: INR 3.0 NO CHANGE IN CURRENT WARFARIN DOSING: 1 TABLET ON WEDNESDAYS AND 1/2 TABLET ALL OTHER DAYS  Current Anticoagulation Instructions: INR 2.8 TODAY CONTINUE CURRENT COUMADIN REGIMEN 1 TABLET ON WEDNESDAYS AND 1/2 TABLET ALL OTHER DAYS

## 2010-03-23 NOTE — Medication Information (Signed)
Summary: ccr-lr  Anticoagulant Therapy  Managed by: Vashti Hey, RN PCP: Forest Becker Supervising MD: Shirlee Latch MD, Dalton Indication 1: Aortic Valve Replacement (ICD-V43.3) Lab Used: [facility] [place] Site: [place] INR POC 1.7  Dietary changes: no    Health status changes: no    Bleeding/hemorrhagic complications: no    Recent/future hospitalizations: yes       Details: Had surgery Rt shoulder for rotator cuff repair  Any changes in medication regimen? yes       Details: on oxycodone for pain  Was taking 4-6 qd   Recent/future dental: no  Any missed doses?: yes     Details: Was off coumadin 7 days before surgery  Resumed 4/14 after surgery  Is patient compliant with meds? yes       Allergies: 1)  ! Codeine  Anticoagulation Management History:      The patient is taking warfarin and comes in today for a routine follow up visit.  Negative risk factors for bleeding include an age less than 54 years old.  The bleeding index is 'low risk'.  Positive CHADS2 values include History of HTN.  Negative CHADS2 values include Age > 21 years old.  The start date was 08/04/2008.  Anticoagulation responsible provider: Shirlee Latch MD, Dalton.  INR POC: 1.7.  Cuvette Lot#: [id number].  Exp: 10/11.    Anticoagulation Management Assessment/Plan:      The patient's current anticoagulation dose is Warfarin sodium 10 mg tabs: Use as directed by Anticoagulation Clinic.  The target INR is 2.5 - 3.5.  The next INR is due 06/18/2009.  Anticoagulation instructions were given to patient.  Results were reviewed/authorized by Vashti Hey, RN.  He was notified by Vashti Hey RN.         Prior Anticoagulation Instructions: INR 3.0 Continue coumadin 5mg  once daily except 10mg  on Wednesdays Pt pending rt shoulder surgery at [facility].  Has not been scheduled yet.  Surgeon to call Cardiologist for directions on stopping coumadin.  Current Anticoagulation Instructions: INR 1.7 Take coumadin 1  tablet tonight and tomorrow night then resume 1/2 tablet once daily except 1 tablet on Wednesdays

## 2010-03-23 NOTE — Assessment & Plan Note (Signed)
Summary: 1 wk bp/hr check per checkout on 07/16/09/tg  Nurse Visit   Vital Signs:  Patient profile:   65 year old male Weight:      176 pounds O2 Sat:      98 % on Room air Pulse rate:   58 / minute Pulse (ortho):   58 / minute BP sitting:   129 / 73  (left arm) BP standing:   133 / 77  Vitals Entered ByLarita Fife Via LPN (July 23, 1608 10:01 AM)  O2 Flow:  Room air  Serial Vital Signs/Assessments:  Time      Position  BP       Pulse  Resp  Temp     By 10:02 AM  Lying LA  127/80   52                    Lynn Via LPN 96:04 AM  Sitting   120/71   50                    Lynn Via LPN 54:09 AM  Standing  133/77   58                    Lynn Via LPN  Comments: no s/s By: Larita Fife Via LPN    Visit Type:  BP/HR check/Nurse visit Referring Provider:  Rothbart Primary Provider:  Forest Becker   History of Present Illness: S: Pt. arrives in office for a BP/HR check and to evaluate symptomatic improvements.  B: On last OV of 07-16-09,  pt. was started on Toprol XL 25mg  once daily for tachycardia (Holter monitor showed peak HR of 165 beats per min.  A: Pt. c/o pain that he states "runs straight through my heart" and left arm pain. Also, he c/o dizziness (orthostatic BP's in chart) and SOB (O2=98% on RA). He states his symptoms have improved slightly since starting Toprol XL 25mg  once daily.  R: We will call pt. back with K.Lawrence, NP recommendations.   Allergies: 1)  ! Codeine

## 2010-03-23 NOTE — Medication Information (Signed)
Summary: ccr-lr  Anticoagulant Therapy  Managed by: Vashti Hey, RN PCP: Forest Becker Supervising MD: Myrtis Ser MD, Tinnie Gens Indication 1: Aortic Valve Replacement (ICD-V43.3) Lab Used: [facility] [place] Site: [place] INR POC 3.0  Dietary changes: no    Health status changes: no    Bleeding/hemorrhagic complications: no    Recent/future hospitalizations: no    Any changes in medication regimen? no    Recent/future dental: no  Any missed doses?: no       Is patient compliant with meds? yes       Allergies: 1)  ! Codeine  Anticoagulation Management History:      The patient is taking warfarin and comes in today for a routine follow up visit.  Negative risk factors for bleeding include an age less than 53 years old.  The bleeding index is 'low risk'.  Positive CHADS2 values include History of HTN.  Negative CHADS2 values include Age > 79 years old.  The start date was 08/04/2008.  Anticoagulation responsible provider: Myrtis Ser MD, Tinnie Gens.  INR POC: 3.0.  Cuvette Lot#: [id number].  Exp: 10/11.    Anticoagulation Management Assessment/Plan:      The patient's current anticoagulation dose is Warfarin sodium 10 mg tabs: Use as directed by Anticoagulation Clinic.  The target INR is 2.5 - 3.5.  The next INR is due 06/05/2009.  Anticoagulation instructions were given to patient.  Results were reviewed/authorized by Vashti Hey, RN.  He was notified by Vashti Hey RN.         Prior Anticoagulation Instructions: INR 2.9 Continue coumadin 5mg  once daily except 10mg  on Wednesdays  Current Anticoagulation Instructions: INR 3.0 Continue coumadin 5mg  once daily except 10mg  on Wednesdays Pt pending rt shoulder surgery at [facility].  Has not been scheduled yet.  Surgeon to call Cardiologist for directions on stopping coumadin.

## 2010-03-23 NOTE — Assessment & Plan Note (Signed)
Summary: 2 wk f/u per checkout on 07/06/09/tg   Visit Type:  Follow-up Referring Provider:  Rothbart Primary Provider:  Forest Becker  CC:  no cardiology complaints .  History of Present Illness: Stephen Moses is a 43 CM with known H/O bicuspid AoV and ascending Aortic Aneurysm, with placement of [employer] Jude mechanical valve conduit and repair of aneurysm by Dr. Laneta Simmers 07/30/2008, PAF post-operatively, and chronic back pain.  He underwent a cardiac catherization prior to this surgery revealing normal coronaries and normal LV fx.  He is on coumadin and is being followed by coumadin clinic in [place].    During coumadin clinic visit, he compained of dizziness, L scapular pain radiating into the left chest described as a severe cramping feelings.  This has been occuring on and off for 6 months. Lately almost everyday.  He also describes dizziness and nausea with diaphoresis associated with this. He also has episodes of dizziness without chest pain. Recently the pain extended down to L arm.  Again a severe cramp.  He denies syncope or SOB associated with ths.  He states that it lasts approximately 2-4 mintues and is relieved on its own.  He notices this most after or during exertion. Once he noticed this after getting out of the shower after working for several hours in his garden. It was associated with diaphoresis.  Sometimes he feels his heart fluttering or racing with it, but not always,  He has never awakened him from sleep. Energy level is unchanged.    As a result of these symptoms he was scheduled for echocardiogram and holter monitor for evaluation of prosthetic aortic valve and frequency of arrythimias.  He is here to discuss the results. He has had symptoms of tachycardia and diaphoresis while wearing the Holter.  Current Medications (verified): 1)  Tylenol Extra Strength 500 Mg Tabs (Acetaminophen) .... As Needed 2)  Warfarin Sodium 10 Mg Tabs (Warfarin Sodium) .... Use As Directed By  Anticoagulation Clinic 3)  Surgical Clearance .... Cardiac Clearance For Surgery [name] Dr. Shelle Iron, But Pt Will Need Anticoagulation 4)  Nitrostat 0.4 Mg Subl (Nitroglycerin) .[name].. 1 Tablet Under Tongue At Onset of Chest Pain; You May Repeat Every 5 Minutes For Up To 3 Doses. 5)  Metoprolol Succinate 25 Mg Xr24h-Tab (Metoprolol Succinate) .... Take One Tablet By Mouth Daily  Allergies (verified): 1)  ! Codeine  Review of Systems       Tachycardia, palpatations with diaphoresis.  All other systems have been reviewed and are negative unless stated above.   Vital Signs:  Patient profile:   65 year old male Weight:      175 pounds Pulse rate:   77 / minute BP sitting:   121 / 72  (right arm)  Vitals Entered By: Dreama Saa, CNA (Jul 16, 2009 1:13 PM)  Physical Exam  General:  Well developed, well nourished, in no acute distress. Lungs:  Clear bilaterally to auscultation and percussion. Heart:  Non-displaced PMI, chest non-tender; regular rate and rhythm, S1, S2 without murmurs, rubs or gallops. Carotid upstroke normal, no bruit. Normal abdominal aortic size, no bruits. Femorals normal pulses, no bruits. Pedals normal pulses. No edema, no varicosities. Psych:  Normal affect.   Impression & Recommendations:  Problem # 1:  AORTIC VALVE REPLACEMENT, HX OF (ICD-V43.3) Review of echo to assess aortic valve revealed :Left ventricle: The cavity size was normal. Wall thickness was increased in a pattern of mild LVH. Systolic function was normal. The estimated ejection fraction was in  the range of 55% to 60%. Wall motion was normal; there were no regional wall motion abnormalities. Features are consistent with a pseudonormal left ventricular filling pattern, with concomitant abnormal relaxation and increased filling pressure (grade 2 diastolic dysfunction)  Ventricular septum: Septal motion showed paradox. Aortic valve: A mechanical prosthesis was present - structure not well seen. No definite  perivalvular leak. Trivial regurgitation.  He has planned colonoscopy on June 6th.  His coumadin is being held for 3 days prior to the procedure.  Coumadin clinic has notified GI.  They will follow-up with him w/i two days post procedure.   Problem # 2:  PALPITATIONS (ICD-785.1) Review of holter showed: Unremarkable continuous electrocardiographic recording demonstrating no significant arrhythmias and no EKG abnormalities duringa single symptomatic spell.  Prominent sinus tachycardia occurred intermittently, apparently, mostly during physical exercise, with a peak heart rate of 165 beats per minute. Will start him on Toprol XL 25mg  daily.  He will return to office in one week for BP and HR check and to evaluate symptomatic improvement.  We may need to titrate up if he tolerates this if necessary. His updated medication list for this problem includes:    Warfarin Sodium 10 Mg Tabs (Warfarin sodium) ..... Use as directed by anticoagulation clinic    Nitrostat 0.4 Mg Subl (Nitroglycerin) .[name]... 1 tablet under tongue at onset of chest pain; you may repeat every 5 minutes for up to 3 doses.    Metoprolol Succinate 25 Mg Xr24h-tab (Metoprolol succinate) .[name]... Take one tablet by mouth daily  Patient Instructions: 1)  Your physician recommends that you schedule a follow-up appointment in: 1 month 2)  Your physician has recommended you make the following change in your medication: start metoprolol succinate 25mg  daily 3)  You have been referred to nurse visit in 1 week  4)  Colonoscopy scheduled for June 6th 5)  hold warfarin x3 days prior to colonoscopy and restart coumadin that night after procedure Prescriptions: METOPROLOL SUCCINATE 25 MG XR24H-TAB (METOPROLOL SUCCINATE) Take one tablet by mouth daily  #30 x 3   Entered by:   Stephen Lower RN   Authorized by:   Joni Reining, NP   Signed by:   Stephen Lower RN on 07/16/2009   Method used:   Electronically to        [facility] (retail)        [address]       [place], [place]  [address]       Ph: [phone]       Fax: [phone]   RxID:   [id number]

## 2010-03-23 NOTE — Letter (Signed)
Summary: Clearance Letter  [facility], Main Office  [address]   [place], [place] [address]   Phone: [phone]  Fax: [phone]    May 14, 2009  Re:     Stephen Moses Address:   [address]     [place], [place]  [address] DOB:     Apr 20, 1945 MRN:     [id number]     DR Bonita Quin NAMED PT IS CLEARED FOR SHOULDER SURGERY PER DR [facility] TO HANDLE ANTICOAGULATION AT DR [facility] REQUEST.           Sincerely,  DR TOM WALL/Marquelle Balow, LPN  Appended Document: Clearance Letter FAXED TO DR GILMER ATTN WANDA  AT PT'S REQUEST TO [phone].

## 2010-03-23 NOTE — Medication Information (Signed)
Summary: ccr-lr  Anticoagulant Therapy  Managed by: Vashti Hey, RN PCP: Forest Becker Supervising MD: Antoine Poche MD, Fayrene Fearing Indication 1: Aortic Valve Replacement (ICD-V43.3) Lab Used: [facility] [place] Site: [place] INR POC 2.9  Dietary changes: no    Health status changes: no    Bleeding/hemorrhagic complications: no    Recent/future hospitalizations: no    Any changes in medication regimen? no    Recent/future dental: no  Any missed doses?: no       Is patient compliant with meds? yes       Allergies: 1)  ! Codeine  Anticoagulation Management History:      The patient is taking warfarin and comes in today for a routine follow up visit.  Negative risk factors for bleeding include an age less than 107 years old.  The bleeding index is 'low risk'.  Positive CHADS2 values include History of HTN.  Negative CHADS2 values include Age > 37 years old.  The start date was 08/04/2008.  Anticoagulation responsible provider: Antoine Poche MD, Fayrene Fearing.  INR POC: 2.9.  Cuvette Lot#: [id number].  Exp: 10/11.    Anticoagulation Management Assessment/Plan:      The patient's current anticoagulation dose is Warfarin sodium 10 mg tabs: Use as directed by Anticoagulation Clinic.  The target INR is 2.5 - 3.5.  The next INR is due 04/17/2009.  Anticoagulation instructions were given to patient.  Results were reviewed/authorized by Vashti Hey, RN.  He was notified by Vashti Hey RN.         Prior Anticoagulation Instructions: INR 3.5 Continue coumadin 5mg  once daily except 10mg  on Wednesdays  Current Anticoagulation Instructions: INR 2.9 Continue coumadin 5mg  once daily except 10mg  on Wednesdays

## 2010-03-23 NOTE — Assessment & Plan Note (Signed)
Summary: PER LISA FOR PT HAVING PROBLMES/TG   Visit Type:  Follow-up Primary Provider:  Forest Becker  CC:  patient having chest pain and left arm with dizziness.  History of Present Illness: Stephen Moses is a 20 CM with known H/O bicuspid AoV and ascending Aortic Aneurysm, with placement of St. Jude mechanical valve conduit and repair of aneurysm by Dr. Laneta Simmers 07/30/2008, PAF post-operatively, and chronic back pain.  He underwent a cardiac catherization prior to this surgery revealing normal coronaries and normal LV fx.  He is on coumadin and is being followed by coumadin clinic in [place].    During this morning's coumadin clinic visit, he compained of dizziness, L scapular pain radiating into the left chest described as a severe cramping feelings.  This has been occuring on and off for 6 months. Lately almost everyday.  He also describes dizziness and nausea with diaphoresis associated with this. He also has episodes of dizziness without chest pain. Recently the pain extended down to L arm.  Again a severe cramp.  He denies syncope or SOB associated with ths.  He states that it lasts approximately 2-4 mintues and is relieved on its own.  He notices this most after or during exertion. Once he noticed this after getting out of the shower after working for several hours in his garden. It was associated with diaphoresis.  Sometimes he feels his heart fluttering or racing with it, but not always,  He has never awakened him from sleep. Energy level is unchanged.    He has had recent lab work prior to [employer] repair last month.  I will obtain copies of these labs as they are not available in E-Chart.  He denies melana, tarry stools, or hemoptysis. He has had a history of back pain and paraspinal spams in the past.    Current Medications (verified): 1)  Tylenol Extra Strength 500 Mg Tabs (Acetaminophen) .... As Needed 2)  Warfarin Sodium 10 Mg Tabs (Warfarin Sodium) .... Use As Directed By  Anticoagulation Clinic 3)  Surgical Clearance .... Cardiac Clearance For Surgery Stephen Moses Dr. Shelle Iron, But Pt Will Need Anticoagulation 4)  Nitrostat 0.4 Mg Subl (Nitroglycerin) .[name].. 1 Tablet Under Tongue At Onset of Chest Pain; You May Repeat Every 5 Minutes For Up To 3 Doses.  Allergies (verified): 1)  ! Codeine  Past History:  Past Medical History: Current Problems:  HYPERTENSION (ICD-401.9) RENAL CALCULUS (ICD-592.0) Paraspinal spams Dizziness  Social History: Reviewed history from 08/08/2008 and no changes required. Full Time Divorced  Tobacco Use - No.  Alcohol Use - no Regular Exercise - no Drug Use - no 4 deceased brothers 1 leukemia infarction 1lung cancer1colon cance 5 sisters alive and well 1 has leukemia  Review of Systems       Dizziness with associated diaphoresis, some heart fluttering. Occurs with bending over and exertion. All other systems have been reviewed and are negative unless stated above.   Vital Signs:  Patient profile:   65 year old male Weight:      179 pounds BMI:     29.89 Pulse rate:   73 / minute BP sitting:   131 / 74  (right arm)  Vitals Entered By: Dreama Saa, CNA (Jul 06, 2009 11:20 AM)  Physical Exam  General:  Well developed, well nourished, in no acute distress. Head:  normocephalic and atraumatic Eyes:  PERRLA/EOM intact; conjunctiva and lids normal. Ears:  TM's intact and clear with normal canals and hearing Mouth:  Teeth, gums  and palate normal. Oral mucosa normal. Neck:  Neck supple, no JVD. No masses, thyromegaly or abnormal cervical nodes. Lungs:  Clear bilaterally to auscultation and percussion. Heart:  Mechanical  valve crisp sound systolic with radiation to the carotids. Abdomen:  Bowel sounds positive; abdomen soft and non-tender without masses, organomegaly, or hernias noted. No hepatosplenomegaly. No bruits are ausculatated. Msk:  Soreness with movement of R shoulder, but not left. Pulses:  pulses  normal in all 4 extremities Extremities:  No clubbing or cyanosis. Psych:  Normal affect.   EKG  Procedure date:  07/06/2009  Findings:      Normal sinus rhythm with rate of:  74bpm  Impression & Recommendations:  Problem # 1:  CHEST TIGHTNESS-PRESSURE-OTHER ([id number]) This appears musculoskeletal in etiology beginning in the left shoulder blade and radating into the L chest and L arm.  Cardiac cath showed normal coronaries in 2010.  He may be having coronary spasms as well.  I have ordered an echo to evaluate prosthetic aortic valve and also LA.    Problem # 2:  PALPITATIONS (ICD-785.1) During 2010 hospitalization he had post-operative AFib.   I cannot r/o arrythmias causing these symptoms of dizziness, and palpatations.  He maybe having tachybrady, or PAF.  I have placed a holter monitor (48 hr) to evaluate for this. He should have a cardionet monitor for longer evaluation but his insurance does not cover this.  His updated medication list for this problem includes:    Warfarin Sodium 10 Mg Tabs (Warfarin sodium) ..... Use as directed by anticoagulation clinic    Nitrostat 0.4 Mg Subl (Nitroglycerin) .[name]... 1 tablet under tongue at onset of chest pain; you may repeat every 5 minutes for up to 3 doses.  Problem # 3:  DIZZINESS (ICD-780.4) I have assessed his carotids and   hear a mechanical bruit.  May need to consider a carotid U/S or CT of the brain if symptoms persist.  He is not on any antihypertensives or BB.  Orthostatics were negative.  Other Orders: 2-D Echocardiogram (2D Echo) Holter Monitor (Holter Monitor)  Patient Instructions: 1)  Your physician recommends that you schedule a follow-up appointment in: 2 and 1/2 to 3 weeks 2)  Your physician recommended you take 1 tablet (or 1 spray) under tongue at onset of chest pain; you may repeat every 5 minutes for up to 3 doses. If 3 or more doses are required, call 911 and proceed to the ER immediately. 3)  Your physician has  requested that you have an echocardiogram.  Echocardiography is a painless test that uses sound waves to create images of your heart. It provides your doctor with information about the size and shape of your heart and how well your heart's chambers and valves are working.  This procedure takes approximately one hour. There are no restrictions for this procedure. 4)  If you have chest pain that lasts longer than 2 to 3 minutes go straight to ED. 5)  Your physician has recommended that you wear a holter monitor.  Holter monitors are medical devices that record the heart's electrical activity. Doctors most often use these monitors to diagnose arrhythmias. Arrhythmias are problems with the speed or rhythm of the heartbeat. The monitor is a small, portable device. You can wear one while you do your normal daily activities. This is usually used to diagnose what is causing palpitations/syncope (passing out). Prescriptions: NITROSTAT 0.4 MG SUBL (NITROGLYCERIN) 1 tablet under tongue at onset of chest pain; you may repeat every  5 minutes for up to 3 doses.  #25 x 0   Entered by:   Larita Fife Via LPN   Authorized by:   Joni Reining, NP   Signed by:   Larita Fife Via LPN on 16/11/9602   Method used:   Electronically to        [facility]* (retail)       [address]       [place], [place]  [address]       Ph: [phone]       Fax: [phone]   RxID:   [id number]

## 2010-03-23 NOTE — Progress Notes (Signed)
Summary: Coumadin Question  Phone Note Call from Patient   Caller: Spouse Summary of Call: pt's wife would like to know when pt should restart coumadin/pt had surgery yesterday/tg Initial call taken by: Raechel [facility],  June 04, 2009 2:27 PM  Follow-up for Phone Call        Pt to restart coumadin tonight at regular schedule unless advised differently by surgeon.  Recheck INR 06/11/09.  Appt made. Follow-up by: Vashti Hey RN,  June 04, 2009 2:49 PM

## 2010-03-23 NOTE — Medication Information (Signed)
Summary: rov/sp  Anticoagulant Therapy  Managed by: Teressa Lower, RN PCP: Gala Lewandowsky Rayburn Ma Supervising MD: Eden Emms MD, Theron Arista Indication 1: Aortic Valve Replacement (ICD-V43.3) Lab Used: [facility] [place] Site: [place] INR POC 3.0  Dietary changes: no    Health status changes: no    Bleeding/hemorrhagic complications: no    Recent/future hospitalizations: no    Any changes in medication regimen? no    Recent/future dental: no  Any missed doses?: no       Is patient compliant with meds? yes       Current Medications (verified): 1)  Tylenol Extra Strength 500 Mg Tabs (Acetaminophen) .... As Needed 2)  Warfarin Sodium 10 Mg Tabs (Warfarin Sodium) .... Use As Directed By Anticoagulation Clinic 3)  Surgical Clearance .... Cardiac Clearance For Surgery [name] Dr. Shelle Iron, But Pt Will Need Anticoagulation 4)  Nitrostat 0.4 Mg Subl (Nitroglycerin) .[name].. 1 Tablet Under Tongue At Onset of Chest Pain; You May Repeat Every 5 Minutes For Up To 3 Doses. 5)  Metoprolol Succinate 25 Mg Xr24h-Tab (Metoprolol Succinate) .... Take One Tablet By Mouth Daily  Allergies (verified): 1)  ! Codeine  Anticoagulation Management History:      The patient is taking warfarin and comes in today for a routine follow up visit.  Negative risk factors for bleeding include an age less than 88 years old.  The bleeding index is 'low risk'.  Positive CHADS2 values include History of HTN.  Negative CHADS2 values include Age > 33 years old.  The start date was 08/04/2008.  Anticoagulation responsible provider: Eden Emms MD, Theron Arista.  INR POC: 3.0.  Cuvette Lot#: [id number].  Exp: 06/2010.    Anticoagulation Management Assessment/Plan:      The patient's current anticoagulation dose is Warfarin sodium 10 mg tabs: Use as directed by Anticoagulation Clinic.  The target INR is 2.5 - 3.5.  The next INR is due 09/24/2009.  Anticoagulation instructions were given to patient.  Results were  reviewed/authorized by Teressa Lower, RN.  He was notified by Teressa Lower RN.         Prior Anticoagulation Instructions: INR 3.0  Continue same dose of 1/2 tablet every day except 1 tablet on Wednesday.   Current Anticoagulation Instructions: INR 3.0 NO CHANGE IN CURRENT WARFARIN DOSING: 1 TABLET ON WEDNESDAYS AND 1/2 TABLET ALL OTHER DAYS

## 2010-03-25 NOTE — Medication Information (Signed)
Summary: 4 WK PROTIME PER CHECKOUT ON 01/22/10/TG  Anticoagulant Therapy  Managed by: Teressa Lower, RN PCP: Forest Becker Supervising MD: Dietrich Pates MD, Molly Maduro Indication 1: Aortic Valve Replacement (ICD-V43.3) Lab Used: [facility]  Site: [place] INR POC 3.7  Dietary changes: no    Health status changes: no    Bleeding/hemorrhagic complications: no    Recent/future hospitalizations: no    Any changes in medication regimen? no    Recent/future dental: no  Any missed doses?: no       Is patient compliant with meds? yes      Comments: pt is drinking 1 glass of red wine a night before bed  Current Medications (verified): 1)  Warfarin Sodium 10 Mg Tabs (Warfarin Sodium) .... Use As Directed By Anticoagulation Clinic 2)  Surgical Clearance .... Cardiac Clearance For Surgery [name] Dr. Shelle Iron, But Pt Will Need Anticoagulation 3)  Nitrostat 0.4 Mg Subl (Nitroglycerin) .[name].. 1 Tablet Under Tongue At Onset of Chest Pain; You May Repeat Every 5 Minutes For Up To 3 Doses. 4)  Metoprolol Succinate 25 Mg Xr24h-Tab (Metoprolol Succinate) .... Take One Tablet By Mouth Daily 5)  Endocet 5-325 Mg Tabs (Oxycodone-Acetaminophen) .... Take Every 6 Hrs As Needed For Pain  Allergies (verified): 1)  ! Codeine  Anticoagulation Management History:      The patient is taking warfarin and comes in today for a routine follow up visit.  Negative risk factors for bleeding include an age less than 19 years old.  The bleeding index is 'low risk'.  Positive CHADS2 values include History of HTN.  Negative CHADS2 values include Age > 11 years old.  The start date was 08/04/2008.  Anticoagulation responsible Nailani Full: Dietrich Pates MD, Molly Maduro.  INR POC: 3.7.  Exp: 01/2011.    Anticoagulation Management Assessment/Plan:      The patient's current anticoagulation dose is Warfarin sodium 10 mg tabs: Use as directed by Anticoagulation Clinic.  The target INR is 2.5 - 3.5.  The next INR  is due 03/12/2010.  Anticoagulation instructions were given to patient.  Results were reviewed/authorized by Teressa Lower, RN.  He was notified by Teressa Lower RN.         Prior Anticoagulation Instructions: INR 2.5 TODAY CONTINUE CURRENT DOSE OF WARFARIN 1 TABLET ON WEDNESDAYS  AND FRIDAYS, 0.5 TABLET ALL OTHER DAYS  Current Anticoagulation Instructions: INR 3.7 TODAY CHANGE DOSE TO 1 TABLET ON WEDNESDAYS AND 0.5 TABLET ALL OTHER DAYS

## 2010-03-25 NOTE — Medication Information (Signed)
Summary: 4 wk protime per checkout on 02/18/10/tg  Anticoagulant Therapy  Managed by: Teressa Lower, RN PCP: Gala Lewandowsky Rayburn Ma Supervising MD: Dietrich Pates MD, Molly Maduro Indication 1: Aortic Valve Replacement (ICD-V43.3) Lab Used: [facility] [place] Site: [place] INR POC 2.4  Dietary changes: no    Health status changes: no    Bleeding/hemorrhagic complications: no    Recent/future hospitalizations: no    Any changes in medication regimen? no    Recent/future dental: no  Any missed doses?: no       Is patient compliant with meds? yes       Current Medications (verified): 1)  Warfarin Sodium 10 Mg Tabs (Warfarin Sodium) .... Use As Directed By Anticoagulation Clinic 2)  Surgical Clearance .... Cardiac Clearance For Surgery [name] Dr. Shelle Iron, But Pt Will Need Anticoagulation 3)  Nitrostat 0.4 Mg Subl (Nitroglycerin) .[name].. 1 Tablet Under Tongue At Onset of Chest Pain; You May Repeat Every 5 Minutes For Up To 3 Doses. 4)  Metoprolol Succinate 25 Mg Xr24h-Tab (Metoprolol Succinate) .... Take One Tablet By Mouth Daily 5)  Endocet 5-325 Mg Tabs (Oxycodone-Acetaminophen) .... Take Every 6 Hrs As Needed For Pain  Allergies (verified): 1)  ! Codeine  Anticoagulation Management History:      The patient is taking warfarin and comes in today for a routine follow up visit.  Negative risk factors for bleeding include an age less than 12 years old.  The bleeding index is 'low risk'.  Positive CHADS2 values include History of HTN.  Negative CHADS2 values include Age > 18 years old.  The start date was 08/04/2008.  Anticoagulation responsible provider: Dietrich Pates MD, Molly Maduro.  INR POC: 2.4.  Cuvette Lot#: [id number].  Exp: 02/2010.    Anticoagulation Management Assessment/Plan:      The patient's current anticoagulation dose is Warfarin sodium 10 mg tabs: Use as directed by Anticoagulation Clinic.  The target INR is 2.5 - 3.5.  The next INR is due 04/16/2010.  Anticoagulation  instructions were given to patient.  Results were reviewed/authorized by Teressa Lower, RN.  He was notified by Teressa Lower RN.         Prior Anticoagulation Instructions: INR 3.7 TODAY CHANGE DOSE TO 1 TABLET ON WEDNESDAYS AND 0.5 TABLET ALL OTHER DAYS  Current Anticoagulation Instructions: INR 2.4 TODAY INCREASE DOSE TO 1 TABLET ON WEDNESDAYS AND FRIDAYS AND 0.5 TABLET ALL OTHER DAYS

## 2010-04-13 ENCOUNTER — Encounter: Payer: Self-pay | Admitting: Cardiology

## 2010-04-16 ENCOUNTER — Ambulatory Visit: Payer: Self-pay

## 2010-04-23 ENCOUNTER — Encounter (INDEPENDENT_AMBULATORY_CARE_PROVIDER_SITE_OTHER): Payer: Self-pay | Admitting: *Deleted

## 2010-04-23 ENCOUNTER — Ambulatory Visit (INDEPENDENT_AMBULATORY_CARE_PROVIDER_SITE_OTHER): Payer: BC Managed Care – PPO

## 2010-04-23 DIAGNOSIS — I4891 Unspecified atrial fibrillation: Secondary | ICD-10-CM

## 2010-04-23 DIAGNOSIS — Z7901 Long term (current) use of anticoagulants: Secondary | ICD-10-CM

## 2010-04-23 LAB — CONVERTED CEMR LAB: POC INR: 3.6

## 2010-05-04 NOTE — Medication Information (Addendum)
Summary: protime/tg   Anticoagulation Management History:      The patient is taking warfarin and comes in today for a routine follow up visit.  Negative risk factors for bleeding include an age less than 64 years old.  The bleeding index is 'low risk'.  Positive CHADS2 values include History of HTN.  Negative CHADS2 values include Age > 67 years old.  The start date was 08/04/2008.  Anticoagulation responsible provider: Dietrich Pates MD, Molly Maduro.  INR POC: 3.6.  Cuvette Lot#: [id number].  Exp: 02/2011.    Allergies: 1)  ! Codeine   Anticoagulation Management Assessment/Plan:      The patient's current anticoagulation dose is Warfarin sodium 10 mg tabs: Use as directed by Anticoagulation Clinic.  The target INR is 2.5 - 3.5.  The next INR is due 05/21/2010.  Anticoagulation instructions were given to patient.  Results were reviewed/authorized by Teressa Lower, RN.  He was notified by Teressa Lower RN.         Prior Anticoagulation Instructions: INR 2.4 TODAY INCREASE DOSE TO 1 TABLET ON WEDNESDAYS AND FRIDAYS AND 0.5 TABLET ALL OTHER DAYS  Current Anticoagulation Instructions: INR 3.6 TODAY NO CHANGE IN WARFARIN DOSING CONTINUE CURRENT DOSE OF 1 TABLET ON WEDNESDAYS AND FRIDAYS AND 0.5 TABLET ALL OTHER DAYS RECHECK IN 1 MONTH   Anticoagulant Therapy  Managed by: Teressa Lower, RN PCP: Gala Lewandowsky Rayburn Ma Supervising MD: Dietrich Pates MD, Molly Maduro Indication 1: Aortic Valve Replacement (ICD-V43.3) Lab Used: [facility] [place] Site: [place] INR POC 3.6  Dietary changes: no    Health status changes: no    Bleeding/hemorrhagic complications: no    Recent/future hospitalizations: no    Any changes in medication regimen? no    Recent/future dental: no  Any missed doses?: no       Is patient compliant with meds? yes

## 2010-05-28 ENCOUNTER — Ambulatory Visit: Payer: BC Managed Care – PPO

## 2010-05-28 LAB — BASIC METABOLIC PANEL
BUN: 14 mg/dL (ref 6–23)
CO2: 27 mEq/L (ref 19–32)
Chloride: 107 mEq/L (ref 96–112)
Creatinine, Ser: 1.12 mg/dL (ref 0.4–1.5)
Glucose, Bld: 83 mg/dL (ref 70–99)
Potassium: 4.3 mEq/L (ref 3.5–5.1)

## 2010-05-28 LAB — HEMOGLOBIN AND HEMATOCRIT, BLOOD: Hemoglobin: 14.1 g/dL (ref 13.0–17.0)

## 2010-05-30 ENCOUNTER — Encounter: Payer: Self-pay | Admitting: Cardiology

## 2010-05-30 DIAGNOSIS — Z7901 Long term (current) use of anticoagulants: Secondary | ICD-10-CM

## 2010-05-30 DIAGNOSIS — Z954 Presence of other heart-valve replacement: Secondary | ICD-10-CM

## 2010-05-30 DIAGNOSIS — I359 Nonrheumatic aortic valve disorder, unspecified: Secondary | ICD-10-CM

## 2010-05-30 HISTORY — DX: Long term (current) use of anticoagulants: Z79.01

## 2010-05-31 ENCOUNTER — Ambulatory Visit (INDEPENDENT_AMBULATORY_CARE_PROVIDER_SITE_OTHER): Payer: BC Managed Care – PPO | Admitting: *Deleted

## 2010-05-31 DIAGNOSIS — Z7901 Long term (current) use of anticoagulants: Secondary | ICD-10-CM

## 2010-05-31 DIAGNOSIS — I359 Nonrheumatic aortic valve disorder, unspecified: Secondary | ICD-10-CM

## 2010-05-31 DIAGNOSIS — Z954 Presence of other heart-valve replacement: Secondary | ICD-10-CM

## 2010-05-31 LAB — POCT I-STAT, CHEM 8
BUN: 13 mg/dL (ref 6–23)
BUN: 22 mg/dL (ref 6–23)
Calcium, Ion: 1.11 mmol/L — ABNORMAL LOW (ref 1.12–1.32)
Calcium, Ion: 1.2 mmol/L (ref 1.12–1.32)
Chloride: 103 mEq/L (ref 96–112)
Chloride: 107 mEq/L (ref 96–112)
Creatinine, Ser: 1 mg/dL (ref 0.4–1.5)
Creatinine, Ser: 1.1 mg/dL (ref 0.4–1.5)
Glucose, Bld: 140 mg/dL — ABNORMAL HIGH (ref 70–99)
Glucose, Bld: 144 mg/dL — ABNORMAL HIGH (ref 70–99)
HCT: 31 % — ABNORMAL LOW (ref 39.0–52.0)
HCT: 37 % — ABNORMAL LOW (ref 39.0–52.0)
Hemoglobin: 10.5 g/dL — ABNORMAL LOW (ref 13.0–17.0)
Hemoglobin: 12.6 g/dL — ABNORMAL LOW (ref 13.0–17.0)
Potassium: 3.7 mEq/L (ref 3.5–5.1)
Potassium: 3.8 mEq/L (ref 3.5–5.1)
Sodium: 137 mEq/L (ref 135–145)
Sodium: 138 mEq/L (ref 135–145)
TCO2: 20 mmol/L (ref 0–100)
TCO2: 22 mmol/L (ref 0–100)

## 2010-05-31 LAB — BASIC METABOLIC PANEL
BUN: 13 mg/dL (ref 6–23)
BUN: 20 mg/dL (ref 6–23)
BUN: 22 mg/dL (ref 6–23)
BUN: 24 mg/dL — ABNORMAL HIGH (ref 6–23)
CO2: 22 mEq/L (ref 19–32)
CO2: 25 mEq/L (ref 19–32)
CO2: 30 mEq/L (ref 19–32)
CO2: 30 mEq/L (ref 19–32)
Calcium: 8.3 mg/dL — ABNORMAL LOW (ref 8.4–10.5)
Calcium: 8.3 mg/dL — ABNORMAL LOW (ref 8.4–10.5)
Calcium: 8.5 mg/dL (ref 8.4–10.5)
Calcium: 8.9 mg/dL (ref 8.4–10.5)
Chloride: 102 mEq/L (ref 96–112)
Chloride: 106 mEq/L (ref 96–112)
Chloride: 107 mEq/L (ref 96–112)
Chloride: 107 mEq/L (ref 96–112)
Creatinine, Ser: 0.97 mg/dL (ref 0.4–1.5)
Creatinine, Ser: 1.1 mg/dL (ref 0.4–1.5)
Creatinine, Ser: 1.15 mg/dL (ref 0.4–1.5)
Creatinine, Ser: 1.22 mg/dL (ref 0.4–1.5)
GFR calc Af Amer: >60 mL/min (ref >60)
GFR calc Af Amer: >60 mL/min (ref >60)
GFR calc Af Amer: >60 mL/min (ref >60)
GFR calc Af Amer: >60 mL/min (ref >60)
GFR calc non Af Amer: >60 mL/min (ref >60)
GFR calc non Af Amer: >60 mL/min (ref >60)
GFR calc non Af Amer: >60 mL/min (ref >60)
GFR calc non Af Amer: >60 mL/min (ref >60)
Glucose, Bld: 102 mg/dL — ABNORMAL HIGH (ref 70–99)
Glucose, Bld: 113 mg/dL — ABNORMAL HIGH (ref 70–99)
Glucose, Bld: 138 mg/dL — ABNORMAL HIGH (ref 70–99)
Glucose, Bld: 164 mg/dL — ABNORMAL HIGH (ref 70–99)
Potassium: 3.7 mEq/L (ref 3.5–5.1)
Potassium: 4.1 mEq/L (ref 3.5–5.1)
Potassium: 4.2 mEq/L (ref 3.5–5.1)
Potassium: 4.6 mEq/L (ref 3.5–5.1)
Sodium: 132 mEq/L — ABNORMAL LOW (ref 135–145)
Sodium: 137 mEq/L (ref 135–145)
Sodium: 141 mEq/L (ref 135–145)
Sodium: 142 mEq/L (ref 135–145)

## 2010-05-31 LAB — POCT I-STAT 4, (NA,K, GLUC, HGB,HCT)
Glucose, Bld: 105 mg/dL — ABNORMAL HIGH (ref 70–99)
Glucose, Bld: 126 mg/dL — ABNORMAL HIGH (ref 70–99)
Glucose, Bld: 137 mg/dL — ABNORMAL HIGH (ref 70–99)
HCT: 25 % — ABNORMAL LOW (ref 39.0–52.0)
HCT: 38 % — ABNORMAL LOW (ref 39.0–52.0)
HCT: 39 % (ref 39.0–52.0)
Hemoglobin: 13.3 g/dL (ref 13.0–17.0)
Hemoglobin: 8.5 g/dL — ABNORMAL LOW (ref 13.0–17.0)
Hemoglobin: 8.8 g/dL — ABNORMAL LOW (ref 13.0–17.0)
Potassium: 3.6 mEq/L (ref 3.5–5.1)
Potassium: 3.8 mEq/L (ref 3.5–5.1)
Potassium: 3.9 mEq/L (ref 3.5–5.1)
Potassium: 4.1 mEq/L (ref 3.5–5.1)
Potassium: 4.5 mEq/L (ref 3.5–5.1)
Sodium: 132 mEq/L — ABNORMAL LOW (ref 135–145)
Sodium: 137 mEq/L (ref 135–145)
Sodium: 139 mEq/L (ref 135–145)
Sodium: 139 mEq/L (ref 135–145)

## 2010-05-31 LAB — CBC
HCT: 29 % — ABNORMAL LOW (ref 39.0–52.0)
HCT: 29.5 % — ABNORMAL LOW (ref 39.0–52.0)
HCT: 31.1 % — ABNORMAL LOW (ref 39.0–52.0)
HCT: 31.4 % — ABNORMAL LOW (ref 39.0–52.0)
HCT: 33.3 % — ABNORMAL LOW (ref 39.0–52.0)
HCT: 36.7 % — ABNORMAL LOW (ref 39.0–52.0)
Hemoglobin: 10 g/dL — ABNORMAL LOW (ref 13.0–17.0)
Hemoglobin: 10.4 g/dL — ABNORMAL LOW (ref 13.0–17.0)
Hemoglobin: 10.6 g/dL — ABNORMAL LOW (ref 13.0–17.0)
Hemoglobin: 11 g/dL — ABNORMAL LOW (ref 13.0–17.0)
Hemoglobin: 11.7 g/dL — ABNORMAL LOW (ref 13.0–17.0)
Hemoglobin: 13 g/dL (ref 13.0–17.0)
Hemoglobin: 13 g/dL (ref 13.0–17.0)
MCHC: 34.6 g/dL (ref 30.0–36.0)
MCHC: 34.6 g/dL (ref 30.0–36.0)
MCHC: 35 g/dL (ref 30.0–36.0)
MCHC: 35.1 g/dL (ref 30.0–36.0)
MCHC: 35.2 g/dL (ref 30.0–36.0)
MCV: 86.4 fL (ref 78.0–100.0)
MCV: 86.5 fL (ref 78.0–100.0)
MCV: 87.2 fL (ref 78.0–100.0)
MCV: 87.8 fL (ref 78.0–100.0)
MCV: 87.9 fL (ref 78.0–100.0)
Platelets: 119 10*3/uL — ABNORMAL LOW (ref 150–400)
Platelets: 128 10*3/uL — ABNORMAL LOW (ref 150–400)
Platelets: 137 10*3/uL — ABNORMAL LOW (ref 150–400)
Platelets: 138 10*3/uL — ABNORMAL LOW (ref 150–400)
RBC: 3.31 MIL/uL — ABNORMAL LOW (ref 4.22–5.81)
RBC: 3.38 MIL/uL — ABNORMAL LOW (ref 4.22–5.81)
RBC: 3.57 MIL/uL — ABNORMAL LOW (ref 4.22–5.81)
RBC: 3.86 MIL/uL — ABNORMAL LOW (ref 4.22–5.81)
RBC: 4.21 MIL/uL — ABNORMAL LOW (ref 4.22–5.81)
RBC: 4.33 MIL/uL (ref 4.22–5.81)
RDW: 14.6 % (ref 11.5–15.5)
RDW: 15 % (ref 11.5–15.5)
RDW: 15.2 % (ref 11.5–15.5)
RDW: 15.2 % (ref 11.5–15.5)
RDW: 15.3 % (ref 11.5–15.5)
WBC: 10.1 10*3/uL (ref 4.0–10.5)
WBC: 12.1 10*3/uL — ABNORMAL HIGH (ref 4.0–10.5)
WBC: 14.3 10*3/uL — ABNORMAL HIGH (ref 4.0–10.5)
WBC: 14.5 10*3/uL — ABNORMAL HIGH (ref 4.0–10.5)
WBC: 17.4 10*3/uL — ABNORMAL HIGH (ref 4.0–10.5)
WBC: 17.7 10*3/uL — ABNORMAL HIGH (ref 4.0–10.5)
WBC: 9.7 10*3/uL (ref 4.0–10.5)

## 2010-05-31 LAB — POCT I-STAT 3, ART BLOOD GAS (G3+)
Acid-base deficit: 2 mmol/L (ref 0.0–2.0)
Acid-base deficit: 6 mmol/L — ABNORMAL HIGH (ref 0.0–2.0)
Bicarbonate: 21.7 mEq/L (ref 20.0–24.0)
Bicarbonate: 23.3 mEq/L (ref 20.0–24.0)
O2 Saturation: 100 %
O2 Saturation: 94 %
Patient temperature: 37
TCO2: 20 mmol/L (ref 0–100)
TCO2: 22 mmol/L (ref 0–100)
pCO2 arterial: 31.7 mmHg — ABNORMAL LOW (ref 35.0–45.0)
pCO2 arterial: 41.6 mmHg (ref 35.0–45.0)
pH, Arterial: 7.348 — ABNORMAL LOW (ref 7.350–7.450)
pH, Arterial: 7.395 (ref 7.350–7.450)
pO2, Arterial: 329 mmHg — ABNORMAL HIGH (ref 80.0–100.0)

## 2010-05-31 LAB — GLUCOSE, CAPILLARY
Glucose-Capillary: 113 mg/dL — ABNORMAL HIGH (ref 70–99)
Glucose-Capillary: 123 mg/dL — ABNORMAL HIGH (ref 70–99)
Glucose-Capillary: 138 mg/dL — ABNORMAL HIGH (ref 70–99)
Glucose-Capillary: 138 mg/dL — ABNORMAL HIGH (ref 70–99)
Glucose-Capillary: 148 mg/dL — ABNORMAL HIGH (ref 70–99)
Glucose-Capillary: 152 mg/dL — ABNORMAL HIGH (ref 70–99)
Glucose-Capillary: 156 mg/dL — ABNORMAL HIGH (ref 70–99)
Glucose-Capillary: 160 mg/dL — ABNORMAL HIGH (ref 70–99)
Glucose-Capillary: 174 mg/dL — ABNORMAL HIGH (ref 70–99)
Glucose-Capillary: 189 mg/dL — ABNORMAL HIGH (ref 70–99)

## 2010-05-31 LAB — PROTIME-INR
INR: 1.3 (ref 0.00–1.49)
INR: 1.4 (ref 0.00–1.49)
INR: 1.4 (ref 0.00–1.49)
INR: 1.5 (ref 0.00–1.49)
INR: 1.8 — ABNORMAL HIGH (ref 0.00–1.49)
Prothrombin Time: 16.1 seconds — ABNORMAL HIGH (ref 11.6–15.2)
Prothrombin Time: 17.6 seconds — ABNORMAL HIGH (ref 11.6–15.2)
Prothrombin Time: 18.2 seconds — ABNORMAL HIGH (ref 11.6–15.2)
Prothrombin Time: 19.1 seconds — ABNORMAL HIGH (ref 11.6–15.2)
Prothrombin Time: 22.1 seconds — ABNORMAL HIGH (ref 11.6–15.2)

## 2010-05-31 LAB — MAGNESIUM
Magnesium: 2.4 mg/dL (ref 1.5–2.5)
Magnesium: 2.6 mg/dL — ABNORMAL HIGH (ref 1.5–2.5)
Magnesium: 3 mg/dL — ABNORMAL HIGH (ref 1.5–2.5)

## 2010-05-31 LAB — POCT I-STAT GLUCOSE: Operator id: 3406

## 2010-05-31 LAB — HEMOGLOBIN AND HEMATOCRIT, BLOOD
HCT: 24.8 % — ABNORMAL LOW (ref 39.0–52.0)
Hemoglobin: 8.8 g/dL — ABNORMAL LOW (ref 13.0–17.0)

## 2010-05-31 LAB — CREATININE, SERUM
Creatinine, Ser: 0.95 mg/dL (ref 0.4–1.5)
Creatinine, Ser: 1.1 mg/dL (ref 0.4–1.5)
GFR calc Af Amer: >60 mL/min (ref >60)
GFR calc non Af Amer: >60 mL/min (ref >60)

## 2010-05-31 LAB — APTT: aPTT: 36 seconds (ref 24–37)

## 2010-05-31 LAB — PLATELET COUNT: Platelets: 133 10*3/uL — ABNORMAL LOW (ref 150–400)

## 2010-06-01 LAB — BLOOD GAS, ARTERIAL
Bicarbonate: 21.4 mEq/L (ref 20.0–24.0)
FIO2: 0.21 %
O2 Saturation: 96.1 %
Patient temperature: 98.6
TCO2: 22.4 mmol/L (ref 0–100)

## 2010-06-01 LAB — TYPE AND SCREEN
ABO/RH(D): O NEG
Antibody Screen: NEGATIVE

## 2010-06-01 LAB — URINALYSIS, ROUTINE W REFLEX MICROSCOPIC
Glucose, UA: NEGATIVE mg/dL
Hgb urine dipstick: NEGATIVE
Ketones, ur: 15 mg/dL — AB
Protein, ur: NEGATIVE mg/dL
Urobilinogen, UA: 0.2 mg/dL (ref 0.0–1.0)

## 2010-06-01 LAB — COMPREHENSIVE METABOLIC PANEL
BUN: 22 mg/dL (ref 6–23)
Calcium: 9.3 mg/dL (ref 8.4–10.5)
Creatinine, Ser: 0.91 mg/dL (ref 0.4–1.5)
Glucose, Bld: 83 mg/dL (ref 70–99)
Total Protein: 6.8 g/dL (ref 6.0–8.3)

## 2010-06-01 LAB — PROTIME-INR
INR: 1.1 (ref 0.00–1.49)
Prothrombin Time: 14.4 seconds (ref 11.6–15.2)

## 2010-06-01 LAB — CBC
HCT: 42.6 % (ref 39.0–52.0)
Hemoglobin: 14.6 g/dL (ref 13.0–17.0)
MCHC: 34.3 g/dL (ref 30.0–36.0)
MCV: 86.9 fL (ref 78.0–100.0)
RDW: 14.6 % (ref 11.5–15.5)

## 2010-06-01 LAB — HEMOGLOBIN A1C
Hgb A1c MFr Bld: 5.4 % (ref 4.6–6.1)
Mean Plasma Glucose: 108 mg/dL

## 2010-06-08 LAB — URINALYSIS, ROUTINE W REFLEX MICROSCOPIC
Bilirubin Urine: NEGATIVE
Glucose, UA: NEGATIVE mg/dL
Leukocytes, UA: NEGATIVE
Nitrite: NEGATIVE
Protein, ur: NEGATIVE mg/dL
Protein, ur: 100 mg/dL — AB
Specific Gravity, Urine: 1.02 (ref 1.005–1.030)
pH: 6 (ref 5.0–8.0)
pH: 7 (ref 5.0–8.0)

## 2010-06-08 LAB — BASIC METABOLIC PANEL
BUN: 20 mg/dL (ref 6–23)
BUN: 27 mg/dL — ABNORMAL HIGH (ref 6–23)
BUN: 7 mg/dL (ref 6–23)
CO2: 26 mEq/L (ref 19–32)
CO2: 28 mEq/L (ref 19–32)
Calcium: 9.1 mg/dL (ref 8.4–10.5)
Calcium: 9.3 mg/dL (ref 8.4–10.5)
Chloride: 103 mEq/L (ref 96–112)
Chloride: 103 mEq/L (ref 96–112)
Chloride: 103 mEq/L (ref 96–112)
Creatinine, Ser: 1.2 mg/dL (ref 0.4–1.5)
GFR calc Af Amer: >60 mL/min (ref >60)
GFR calc Af Amer: >60 mL/min (ref >60)
GFR calc Af Amer: >60 mL/min (ref >60)
GFR calc non Af Amer: >60 mL/min (ref >60)
Glucose, Bld: 134 mg/dL — ABNORMAL HIGH (ref 70–99)
Potassium: 4.2 mEq/L (ref 3.5–5.1)
Potassium: 4.2 mEq/L (ref 3.5–5.1)
Potassium: 3.7 mEq/L (ref 3.5–5.1)
Sodium: 136 mEq/L (ref 135–145)
Sodium: 139 mEq/L (ref 135–145)

## 2010-06-08 LAB — URINE CULTURE
Colony Count: NO GROWTH
Colony Count: NO GROWTH
Culture: NO GROWTH

## 2010-06-08 LAB — CBC
HCT: 43.2 % (ref 39.0–52.0)
HCT: 43.4 % (ref 39.0–52.0)
Hemoglobin: 14.6 g/dL (ref 13.0–17.0)
Hemoglobin: 14.9 g/dL (ref 13.0–17.0)
MCHC: 33.7 g/dL (ref 30.0–36.0)
MCV: 87 fL (ref 78.0–100.0)
MCV: 88.3 fL (ref 78.0–100.0)
Platelets: 222 10*3/uL (ref 150–400)
RBC: 4.59 MIL/uL (ref 4.22–5.81)
RBC: 5.04 MIL/uL (ref 4.22–5.81)
RDW: 14.4 % (ref 11.5–15.5)
WBC: 9.7 10*3/uL (ref 4.0–10.5)

## 2010-06-08 LAB — BASIC METABOLIC PANEL WITH GFR
CO2: 22 meq/L (ref 19–32)
Calcium: 9.2 mg/dL (ref 8.4–10.5)
Creatinine, Ser: 0.95 mg/dL (ref 0.4–1.5)
GFR calc non Af Amer: >60 mL/min (ref >60)
Glucose, Bld: 129 mg/dL — ABNORMAL HIGH (ref 70–99)
Sodium: 135 meq/L (ref 135–145)

## 2010-06-08 LAB — URINE MICROSCOPIC-ADD ON

## 2010-06-08 LAB — DIFFERENTIAL
Basophils Absolute: 0 10*3/uL (ref 0.0–0.1)
Basophils Relative: 1 % (ref 0–1)
Basophils Relative: 0 % (ref 0–1)
Eosinophils Absolute: 0.1 10*3/uL (ref 0.0–0.7)
Eosinophils Absolute: 0 10*3/uL (ref 0.0–0.7)
Eosinophils Relative: 0 % (ref 0–5)
Lymphocytes Relative: 7 % — ABNORMAL LOW (ref 12–46)
Lymphs Abs: 2.5 10*3/uL (ref 0.7–4.0)
Monocytes Absolute: 1.2 10*3/uL — ABNORMAL HIGH (ref 0.1–1.0)
Neutro Abs: 6.2 10*3/uL (ref 1.7–7.7)
Neutrophils Relative %: 64 % (ref 43–77)

## 2010-07-02 ENCOUNTER — Ambulatory Visit (INDEPENDENT_AMBULATORY_CARE_PROVIDER_SITE_OTHER): Payer: BC Managed Care – PPO

## 2010-07-02 ENCOUNTER — Ambulatory Visit (INDEPENDENT_AMBULATORY_CARE_PROVIDER_SITE_OTHER): Payer: BC Managed Care – PPO | Admitting: *Deleted

## 2010-07-02 DIAGNOSIS — I359 Nonrheumatic aortic valve disorder, unspecified: Secondary | ICD-10-CM

## 2010-07-02 LAB — POCT INR: INR: 4.5

## 2010-07-02 NOTE — Patient Instructions (Signed)
INR 4.5 TODAY HOLD DOSE TONIGHT AND Sunday  NEW DOSE SCHEDULE  1 TABLET ON WEDNESDAYS AND 1/2 TABLET ALL OTHER DAYS RECHECK IN 1 WEEK

## 2010-07-06 NOTE — Assessment & Plan Note (Signed)
[facility]                       [place] CARDIOLOGY OFFICE NOTE   KELDRICK, POMPLUN                     MRN:          [id number]  DATE:11/10/2008                            DOB:          [birth year]    Mr. Pinney comes in today for followup.   PROBLEM LIST:  1. History of bicuspid aortic valve status post [employer] mechanical      valve replacement, July 25, 2008.  2. Ascending aortic root aneurysm status post repair, July 25, 2008.  3. Postoperative atrial fibrillation, resolved.  4. Transient bradycardia requiring temporary pacing, resolved  5. Anticoagulation.  6. Normal coronary arteries.   He has no cardiac complaints today, except he still gets short of breath  when he tries to lift.  This is when he is straining.  When he is  walking along with his cane, he really does not have any symptoms.   He has chronic back pain and chronic back problems and may have to have  repeat surgery with Dr. Shelle Iron.  We will clear him for this today, but  anticoagulation will have to be perioperatively managed.   His INR today is 1.9.   MEDICATIONS:  1. Aspirin 81 mg a day.  2. Tylenol Extra Strength.  3. Warfarin 5 mg per day.   PHYSICAL EXAMINATION:  VITAL SIGNS:  Blood pressure today is 136/83, his  pulse is 75 beats per minute and regular, his weight is 187.  HEENT:  Unremarkable.  NECK:  Carotid upstrokes were equal bilaterally with referred sounds in  the neck.  Thyroid is not enlarged.  Trachea is midline. LUNGS:  Clear  to auscultation and percussion.  HEART:  A nondisplaced PMI.  No S4, soft systolic murmur, S2 splits.  No  diastolic component heard.  Median sternotomy is well healed.  ABDOMEN:  Soft, good bowel sounds.  EXTREMITIES:  No cyanosis, clubbing or edema.  Pulses are intact.  NEURO:  Intact.   ASSESSMENT AND PLAN:  Mr. Rolfe is doing well.  I have written a note  to clear him for surgery.  We will discontinue his aspirin.  I  will see  him back again in June 2011.     Thomas C. Daleen Squibb, MD, [facility]  Electronically Signed    TCW/MedQ  DD: 11/10/2008  DT: 11/11/2008  Job #: [id number]   cc:   Kingsley Callander. [name], MD

## 2010-07-06 NOTE — Consult Note (Signed)
NAME:  EVERTON, BERTHA NO.:  [id number]   MEDICAL RECORD NO.:  [id number]          PATIENT TYPE:  EMS   LOCATION:  NA                           FACILITY:  [facility]   PHYSICIAN:  Heloise Purpura, MD      DATE OF BIRTH:  1945-11-12   DATE OF CONSULTATION:  04/20/2008  DATE OF DISCHARGE:                                 CONSULTATION   REQUESTING PHYSICIAN:  Dr. Cheri Guppy.   REASON FOR CONSULTATION:  Abdominal pain.   HISTORY:  Mr. Chui is a 65 year old who is seen in consultation at the  request of Dr. Cheri Guppy for a complaint of uncontrolled  abdominal pain.  He has a history of a 5-mm ureteral stone and is status  post ureteroscopic laser lithotripsy on Wednesday.  He was discharged  home the following morning and was given a prescription for hydrocodone.  He presented to the emergency department tonight with subjective chills,  as well as uncontrolled pain.  On his evaluation in the emergency  department, he did undergo a KUB x-ray which demonstrated his ureteral  stent to be in the appropriate position.  No remaining radio-opaque  calculi were seen on his KUB.  He remained objectively afebrile and his  urinalysis demonstrated pyuria and hematuria consistent with his  ureteral stent.  No bacteria was present and the patient did not appear  to have other signs of obvious infection.  He denied any nausea or  vomiting.  His pain was able to be controlled with IV Dilaudid.   PAST MEDICAL HISTORY:  1. Hypertension.  2. Nephrolithiasis.   PAST SURGICAL HISTORY:  1. Back surgery.  2. Shoulder surgery.  3. Ureteroscopic laser lithotripsy.   MEDICATIONS:  Hydrocodone.   ALLERGIES:  CODEINE.   FAMILY HISTORY:  There is a family history of nephrolithiasis.   SOCIAL HISTORY:  He did smoke 2 packs of cigarettes for 20 years but  quit approximately 12 years ago.   REVIEW OF SYSTEMS:  CARDIOVASCULAR:  He denies any chest pain.  PULMONARY:  He denies  any shortness of breath.  GI:  He denies any  nausea or vomiting.   PHYSICAL EXAM:  He is currently afebrile and with stable vital signs.  CONSTITUTIONAL:  Alert and oriented, in no acute distress.  ABDOMEN:  He does have significant right lower quadrant tenderness  without rebound tenderness or guarding.  No significant CVA tenderness.  GU:  Normal male phallus with a normal urethral meatus.   IMAGING:  I have reviewed his KUB x-ray which demonstrates a ureteral  stent in appropriate position.  No obvious ureteral or renal calculi are  appreciated.   PROCEDURE:  After discussion regarding the patient's pain and the likely  etiology of his ureteral stent, we discussed the pros and cons of  removing his stent at this time.  After a thorough discussion and his  understanding that he may continue to have pain and could even require  replacement of his ureteral stent, he did agree to proceed with stent  removal to see if this would greatly improve his symptoms.  His  genitalia was prepped and draped in the usual sterile fashion and  flexible cystourethroscopy was performed.  The patient's right  indwelling ureteral stent was identified and was able to be pulled out  to the urethral meatus with the aid of the flexible graspers.  The stent  was then removed in its entirety without difficulty.  The patient  tolerated this procedure well without complications.   IMPRESSION:  Right lower quadrant pain likely related to his ureteral  stent.   PLAN:  He will be discharged home with oral Dilaudid and ciprofloxacin.  He has bee instructed to call should he develop fever to 101 degrees  Fahrenheit or higher or continue to have uncontrolled pain.  He  otherwise will follow up with me as planned as an outpatient in  approximately 7 to 10 days to discuss his stone analysis and further  postoperative evaluation.      Heloise Purpura, MD  Electronically Signed     LB/MEDQ  D:  04/20/2008  T:   04/20/2008  Job:  [id number]   cc:   Hassan Buckler. Weldon Inches, MD  Fax: [phone]

## 2010-07-06 NOTE — Assessment & Plan Note (Signed)
[facility]                       [place] CARDIOLOGY OFFICE NOTE   TORRE, SCHAUMBURG                     MRN:          [id number]  DATE:08/11/2008                            DOB:          1946/01/31    CARDIOLOGIST:  Stephen Sans. Daleen Squibb, MD, [facility]   PRIMARY CARE PHYSICIAN:  Stephen Callander. [name], MD   REASON FOR VISIT:  Post-hospitalization followup.   HISTORY OF PRESENT ILLNESS:  Stephen Moses is a 65 year old male patient,  who recently was evaluated by Dr. Daleen Moses for bicuspid aortic valve and  ascending aortic aneurysm.  He underwent cardiac catheterization that  demonstrated normal coronary arteries.  He was eventually evaluated by  Dr. Laneta Moses for valve replacement.  He underwent implantation of a St.  Jude aortic mechanical valve on July 25, 2008.  His postoperative course  was fairly uneventful.  He apparently had some brief episodes of atrial  fibrillation.  He developed significant bradycardia with amiodarone and  Lopressor, and these medications were both discontinued.  He is now on  chronic Coumadin therapy.  In the office, he denies any difficulty with  shortness of breath or syncope.  His chest is somewhat sore.  He has had  a couple of occasions of pain in his back and seem to be more consistent  with a muscle spasm.  It did create some significant nausea for him, but  no vomiting.  He denies any syncope.  Denies any palpitations.  He  denies orthopnea, PND, or pedal edema.  He has been walking everyday  without significant difficulty.  He is up to about a mile a day now.   CURRENT MEDICATIONS:  1. Aspirin 81 mg daily.  2. Coumadin as directed.   PHYSICAL EXAMINATION:  GENERAL:  A well-nourished male.  VITAL SIGNS:  Blood pressure is 106/78, pulse 66, and weight 195 pounds.  HEENT:  Normal.  NECK:  Without JVD.  CARDIAC:  Normal S1, mechanical S2.  Regular rate and rhythm.  No  murmur.  LUNGS:  Clear to auscultation bilaterally.  ABDOMEN:   Soft and nontender.  EXTREMITIES:  No edema.  NEUROLOGIC:  He is alert and oriented x3.  Cranial nerves II through XII  grossly intact.  SKIN:  Warm and dry.   Electrocardiogram reveals sinus rhythm with heart rate of 83,  nonspecific ST-T wave changes.   ASSESSMENT/PLAN:  1. [name] is a 65 year old male with history of bicuspid aortic      valve with ascending aortic aneurysm measuring 5.6 cm.  He is now      status post St. Jude mechanical aortic valve replacement and is      doing quite well.  He is on chronic Coumadin.  The patient will      have his INR checked by the [facility] Clinic today.  He is      maintaining sinus rhythm, post aortic valve replacement surgery.      No further medication changes are warranted.  Of note, he sees Dr.      Laneta Moses in followup in the next week.  I will bring him back in  followup with Dr. Daleen Moses in the next several months.  Hopefully we      can eventually get him off of aspirin as he does not have      documented coronary artery disease and will need lifelong Coumadin      therapy.  2. Back pain.  He had some recent back pain that sounds more      consistent with paraspinal muscle spasm.  I have asked him to      contact us or Dr. Ouida Moses if he has recurrence of these symptoms.      Otherwise, I do not think it is related to his recent aortic valve      replacement surgery.   DISPOSITION:  He will be brought back in followup with Dr. Daleen Moses in the  next 3 months or sooner p.r.n.      Tereso Newcomer, PA-C  Electronically Signed      Gerrit Friends. Dietrich Pates, MD, [facility]  Electronically Signed   SW/MedQ  DD: 08/11/2008  DT: 08/12/2008  Job #: [id number]   cc:   Stephen Callander. [name], MD  Evelene Croon, M.D.

## 2010-07-06 NOTE — Discharge Summary (Signed)
NAME:  Stephen Moses, Stephen Moses NO.:  [id number]   MEDICAL RECORD NO.:  [id number]          PATIENT TYPE:  INP   LOCATION:  1534                         FACILITY:  [facility]   PHYSICIAN:  Jene Every, M.D.    DATE OF BIRTH:  1945-11-30   DATE OF ADMISSION:  02/11/2008  DATE OF DISCHARGE:  02/14/2008                               DISCHARGE SUMMARY   ADMISSION DIAGNOSES:  1. Include herniated nucleus pulposus at L5-S1 on the left.  2. History of osteoarthritis.   DISCHARGE DIAGNOSES:  1. Include herniated nucleus pulposus at L5-S1 on the left.  2. History of osteoarthritis.  3. Status post lumbar decompression microdiskectomy at L5-S1.   HISTORY:  Mr. Hellmann is a pleasant 65 year old male who Dr. Shelle Iron had  been seeing for a work-related back injury.  Unfortunately, pain got  significantly worse.  He presented to the emergency room in [place].  He was admitted by primary care physician, Dr. Juanetta Gosling with inability to  walk on the left lower extremity.  Dr. Juanetta Gosling then obtained a new MRI  study which did show a fairly significant disk rupture at L5-S1 off to  the left.  This was discussed with Dr. Shelle Iron.  We did agree to have him  transported over to [facility] where we would assume care.  It  was felt at this point, the patient would need a lumbar decompression.  The risks and benefits of this were discussed with the patient.  He does  elect to proceed.   LABORATORY VALUES:  CBC shows a white cell count of 18.9, elevation  secondary to use of Decadron as well as prednisone, hemoglobin was 14.8,  hematocrit 44.1.  Coagulation studies within normal range.  Routine  chemistries showed sodium of 136, potassium 5.1, glucose 150, slightly  elevated BUN 24, normal creatinine at 1.07.  Preoperative urinalysis was  negative.   DIAGNOSTICS:  1. Preoperative EKG showed normal sinus rhythm with septal infarct,      age indeterminate.  2.  Preoperative chest x-ray  showed no active      disease.   PROCEDURE:  The patient was taken to the OR on February 13, 2008 and  underwent lumbar decompression microdiskectomy at L5-S1 on the left.   SURGEON:  Jene Every, M.D.   ASSISTANT:  Roma Schanz, P.A.   ANESTHESIA:  General.   COMPLICATIONS:  None.   CONSULTANT:  PT/OT and Care Management.   HOSPITAL COURSE:  The patient was transferred from [facility]  and admitted here at [facility] secondary to inability to walk.  We  initially treated him conservatively with pain medication as well as  Decadron.  He did note some improvement in his pain, but was still  unable to ambulate without significant difficulty and complaining of  pain in the left lower extremity.  It was felt at this time the patient  would need lumbar decompression microdiskectomy.  This was planned for  February 13, 2008.  The patient did undergo the above stated procedure  without difficulty.  The day following surgery, he notes significant  improvement in  his symptoms.  No pain into the leg, minimal low back  soreness.  Motor and neurovascular function were intact.  Wound was  clean and dry.  The patient was stable to be discharged home.   DISPOSITION:  The patient discharged home with home health needs met.   DISCHARGE INSTRUCTIONS:  1. He is to follow up with Dr. Shelle Iron in approximately 10-14 days for      suture removal.  2. Wound care:  He is to keep this area clean and dry, change his      dressing daily.  Call with any redness or drainage develops.  3. Activity:  He is to ambulate as tolerated.  No bending, stooping,      twisting or lifting.  No driving.  4. Diet as tolerated.   DISCHARGE MEDICATIONS:  1. Vitamin C 500 mg daily.  2. Norco 7.5/325 p.r.n. pain.  3. Advil or Aleve p.r.n.  4. Colace 100 mg b.i.d.   CONDITION ON DISCHARGE:  Stable.   FINAL DIAGNOSIS:  Doing well status post lumbar decompression at L5-S1  on the left.       Roma Schanz, P.A.      Jene Every, M.D.  Electronically Signed    CS/MEDQ  D:  04/02/2008  T:  04/02/2008  Job:  [id number]

## 2010-07-06 NOTE — Op Note (Signed)
NAME:  Stephen Moses, Stephen Moses NO.:  [id number]   MEDICAL RECORD NO.:  [id number]          PATIENT TYPE:  INP   LOCATION:  1534                         FACILITY:  [facility]   PHYSICIAN:  Jene Every, M.D.    DATE OF BIRTH:  [birth year]   DATE OF PROCEDURE:  DATE OF DISCHARGE:                               OPERATIVE REPORT   PREOPERATIVE DIAGNOSIS:  Spinal stenosis, herniated nucleus pulposus L5-  S1, left.   POSTOPERATIVE DIAGNOSIS:  Spinal stenosis, herniated nucleus pulposus L5-  S1, left.   PROCEDURE PERFORMED:  Lateral recess decompression, microdiskectomy L5-  S1, foraminotomy S1.   ANESTHESIA:  General.   ASSISTANT:  __________.   BRIEF HISTORY AND INDICATIONS:  A 65 year old with S1 radiculopathy,  positive __________ sign, EHL weakness, S1 weakness, numbness in the S1  dermatome, MRI indicating disk herniation __________compressing the S1  nerve root.  Indicated for decompression of the S1 nerve root  bilaterally, recess decompression, microdiskectomy.   The risks and benefits were discussed including bleeding, infection,  damage to neurovascular structures, CSF leakage, epidural fibrosis,  adjacent segment disease, need for fusion in the future, anesthetic  complication, etc..   PROCEDURE IN DETAIL:  With the patient in supine position, after the  induction of adequate general anesthesia, 1 gram of __________, placed  prone on the Andrews frame and all bony prominences were well padded.  The lumbar region was prepped and draped in the usual sterile fashion.  Two 18-gauge  spinal needles were utilized to localize the 5-1  interspace and confirmed with x-ray.  An incision was made from the  spinous process of L5 to S1.  Subcutaneous tissue was dissected.  Electrocautery was utilized to achieve hemostasis.  Dorsolumbar fascia  was identified and divided and divided in line with the skin incision.  The paraspinous muscle was elevated from the lamina of L5 and  1.  Operating microscope was draped __________ and brought in the surgical  field after a confirmatory radiograph obtained with a Penfield 4 in the  interlaminar space at 5-1.  Hemilaminotomy __________ was performed with  a 2-mm Kerrison and the caudad edge of the 5 ligamentum flavum removed  from the interspace.  The stenosis was noted laterally and  multifactorial.  The nerve root was compressed.  We decompressed the  lateral recess to the medial border of the pedicle, gently mobilizing  the nerve root medially and protecting it at all times.  Noted was a  focal HNP compressing the root.  The annulotomy was performed.  Copious  portions of disk material was removed from the disk space with straight  and up-biting pituitary, further mobilizing with a hockey stick.  Full  diskectomy of herniated material was performed.  Foraminotomy of the S1  was performed as well.  __________ S1 nerve root medial to the pedicle  at least a centimeter without tension.  A hockey stick probe was placed  at the foramen of 5 and S1 and found to be widely patent.  We checked  beneath the thecal sac __________ without any residual compression or  disk herniation noted.  The disk space was copiously irrigated with  antibiotic irrigation.  Bipolar electrocautery had been utilized to  achieve hemostasis.   The wound was copiously irrigated.  Thrombin-soaked Gelfoam was placed  in the laminotomy defect __________ CSF leak __________.  The McCullough  retractor was removed.  The paraspinous muscles were inspected with no  evidence of active bleeding.  The wound was irrigated.  The dorsolumbar  fascia was reapproximated with  1-0 Vicryl interrupted figure-of-eight  sutures, the subcu with 2-0 Vicryl sutures, and the skin was  reapproximated with 4-0 subcuticular Prolene.  The wound was reinforced  with Steri-Strip.  A sterile dressing was applied.  The patient was  returned to the hospital bed, extubated without  difficulty, and  transferred to the recovery room in satisfactory condition.  The patient  tolerated the procedure well.  There were no complications.  Jene Every, M.D.  Electronically Signed     JB/MEDQ  D:  02/13/2008  T:  02/13/2008  Job:  [id number]

## 2010-07-06 NOTE — Discharge Summary (Signed)
NAME:  Stephen Moses, KRIST NO.:  [id number]   MEDICAL RECORD NO.:  [id number]          PATIENT TYPE:  OIB   LOCATION:  1316                         [facility]ACILITY:  [facility]   PHYSICIAN:  Heloise Purpura, MD      DATE OF BIRTH:  [birth year]   DATE OF ADMISSION:  04/16/2008  DATE OF DISCHARGE:  04/17/2008                               DISCHARGE SUMMARY   ADMISSION DIAGNOSIS:  Right ureteral calculus.   DISCHARGE DIAGNOSIS:  Right ureteral calculus.   PROCEDURE:  Cystoscopy with right ureteroscopy and laser lithotripsy,  and right ureteral stent placement.   HISTORY AND PHYSICAL:  For full details, please see admission history  and physical.  Briefly, [name] is a 65 year old gentleman who  presented to Dr. Vonita Moss earlier today with severe right-sided  abdominal and flank pain and was found to have 5 mm right ureteral  calculus.  This was associated with severe nausea and vomiting.  After  discussion with the patient, he elected to proceed with surgical therapy  and ureteroscopic intervention as stated above.   HOSPITAL COURSE:  On April 16, 2008, the patient was admitted to the  hospital.  He was taken to the operating room later that evening due to  his continued pain and nausea and vomiting.  He underwent ureteroscopy  with laser lithotripsy and stone removal and subsequent stent placement.  He was monitored overnight and did have a slight increase in his  temperature to 100.0.  He subsequently defervesced to 98.4 with  incentive spirometry.  He was able to tolerate a regular diet the  following morning and was ambulating well.  He was, therefore, able to  be discharged home in excellent condition.   DISPOSITION:  Home.   DISCHARGE MEDICATIONS:  He was instructed to resume regular home  medications and was given a prescription to take Vicodin as needed for  pain.   DISPOSITION:  Will follow up in the next 7 to 10 days for removal of his  right ureteral  stent.   ADDENDUM:  Incidentally, on a CT scan from the emergency department, he  was noted to have a 5.6 cm thoracic aortic aneurysm.  This apparently  had not been discussed with the patient and was discussed with the  patient prior to his discharge.  It was recommended that he follow up  with Dr. Carylon Perches, his primary care physician, and then proceed with  appropriate referral for further evaluation and treatment.      Heloise Purpura, MD  Electronically Signed     LB/MEDQ  D:  04/17/2008  T:  04/17/2008  Job:  [id number]   cc:   Kingsley Callander. [name], MD  Fax: [phone]

## 2010-07-06 NOTE — Consult Note (Signed)
NAME:  Stephen Moses, PFARR NO.:  [id number]   MEDICAL RECORD NO.:  [id number]          PATIENT TYPE:  INP   LOCATION:  2315                         FACILITY:  [facility]   PHYSICIAN:  Zenon Mayo, MDDATE OF BIRTH:  January 26, 1946   DATE OF CONSULTATION:  DATE OF DISCHARGE:                                 CONSULTATION   ADDENDUM:   POST-BYPASS EVALUATION:  At the conclusion of bypass, the patient having  been successfully weaned from the cardiopulmonary bypass machine, heart  was again evaluated.  The left ventricle continued to exhibit vigorous  contractions with continued ejection fraction of 60%.  An St. Jude 23 mm  aortic valve was placed into the aortic position and the leaflets  appeared to open and close well with no aortic insufficiency seen.  The  reconstructed aortic arch was in good position.  There did not appear to  be any aortic dissection distally.  The mitral valve that was evaluated  prebypass to reveal trace amounts of mitral regurgitation now had  moderate amounts of mitral regurgitation.  This was discussed with Dr.  Laneta Simmers and was felt that it would improve over time and was going to be  watched further with the patient's recovery.  At the conclusion of the  procedure, the echo probe was removed from the patient's esophagus  without complication.  The patient was taken to the intensive care unit  in stable fashion.           ______________________________  Zenon Mayo, MD     WEF/MEDQ  D:  07/25/2008  T:  07/26/2008  Job:  [id number]

## 2010-07-06 NOTE — Assessment & Plan Note (Signed)
OFFICE VISIT   Stephen Moses, Stephen Moses  DOB:  08-08-1945                                        August 18, 2008  CHART #:  [id number]   The patient comes in today for 3-week postoperative followup.  He is  status post repair of an ascending aortic aneurysm with 23-mm St. Jude  mechanical valve conduit and re-implantation of the coronary arteries on  July 25, 2008.  His postoperative course was notable for atrial  fibrillation which was present both pre and post bypass.  He was started  on amiodarone but developed bradycardia requiring transient pacing and  ultimately discontinuation of both amiodarone and Lopressor.  He was  discharged home in good condition.  Since his discharge home, he has  been progressing nicely.  He has been following up with the [facility] and currently he has told that his INR is therapeutic,  although he does not know the specific value today.  He is taking 5 mg  alternating with 2.5 mg every other day of Coumadin.  Otherwise, he is  only taking aspirin daily.  He has seen Dr. Vern Claude PA and was not  restarted on Lopressor at this time.  He does have an additional  followup scheduled in the next few months and decision will be made  regarding starting the beta-blocker back at that point.  Otherwise, he  is doing very well.  He is ambulating about 1-2 miles daily on his  treadmill.  His appetite has been good.  He has had some mild chest  discomfort as well as some pain in his back which he feels is his  chronic back problems which have been exacerbated by the surgery.  However, he is not taking any pain medication at this point, only plain  Tylenol.  He plans to start cardiac rehab as soon as he is given okay  from our office.  He otherwise has no complaints today.   PHYSICAL EXAMINATION:  VITAL SIGNS:  Blood pressure is 127/82, pulse is  87, respirations 20, and O2 sat 99% on room air.  SKIN:  His sternal and leg incisions  have healed well.  CHEST:  His sternum is stable to palpation.  HEART:  Regular rate and rhythm without murmurs, rubs, or gallops.  His  aortic valve has a good click and no murmurs are appreciated.  LUNGS:  Clear to auscultation.  EXTREMITIES:  No edema.   Chest x-ray is stable with only small bilateral pleural effusions and  sternal wires in good position.   ASSESSMENT/PLAN:  The patient is doing very well, status post aortic  valve replacement and ascending aortic aneurysm repair.  He will follow  up as directed with Dr. Daleen Squibb and the [facility].  He is  discharged from our care at this point and may return on a p.r.n. basis  for followup.  I have released him to start driving short distances  during daylight hours at this point and he will advance as tolerated.  He will also advance his activity as tolerated.  He is encouraged to  proceed with cardiac rehab as he is able.   Evelene Croon, M.D.  Electronically Signed   GC/MEDQ  D:  08/18/2008  T:  08/19/2008  Job:  [id number]   cc:   Thomas C. Wall, MD,  FACC  Kingsley Callander. [name], MD

## 2010-07-06 NOTE — Op Note (Signed)
NAME:  AEDAN, GEIMER NO.:  [id number]   MEDICAL RECORD NO.:  [id number]          PATIENT TYPE:  OIB   LOCATION:  1316                         FACILITY:  [facility]   PHYSICIAN:  Heloise Purpura, MD      DATE OF BIRTH:  April 14, 1945   DATE OF PROCEDURE:  04/16/2008  DATE OF DISCHARGE:  04/17/2008                               OPERATIVE REPORT   PREOPERATIVE DIAGNOSIS:  Right ureteral calculus.   POSTOPERATIVE DIAGNOSIS:  Right ureteral calculus.   PROCEDURE:  1. Cystoscopy.  2. Right retrograde pyelography.  3. Balloon dilation of right ureteral orifice.  4. Right ureteroscopy with laser lithotripsy and stone removal.  5. Right ureteral stent placement (6 x 24).   SURGEON:  Heloise Purpura, MD.   ANESTHESIA:  General.   COMPLICATIONS:  None.   INDICATION:  Mr. Mccormac is a 65 year old gentleman who presented to the  emergency department earlier today with severe right-sided flank pain.  He was evaluated with a CT scan and found to have a 5 mm proximal  ureteral calculus.  He developed worsening pain and was seen by Dr.  Vonita Moss earlier today.  Based on the patient's intractable pain and  continuing symptoms, it was decided after discussion to proceed with  cystoscopy and possible ureteroscopic treatment, but at the very least  to proceed at least with stent placement for pain control.  The  potential risks, complications, and alternative treatment options  associated with the above procedures were discussed in detail and  informed consent was obtained.   DESCRIPTION OF PROCEDURE:  The patient was taken to the operating room  and a general anesthetic was administered.  He was given preoperative  antibiotics, placed in the dorsal lithotomy position, and prepped and  draped in the usual sterile fashion.  Next, a preoperative time-out was  performed.  Cystourethroscopy was then performed which demonstrated a  normal anterior urethra with slight prostatic enlargement  and a slightly  high bladder neck.  The ureteral orifices were in the normal anatomic  position.  There was no evidence for any bladder tumors, stones, or  other mucosal pathology.  The right ureteral orifice was then intubated  with a 0.038 Sensor guidewire and the 6-French ureteral catheter was  passed over the wire.  The ureteral orifice was noted to be somewhat  stenotic.  The 6-French ureteral catheter was able to be placed into the  ureteral orifice without difficulty over the wire.  Omnipaque contrast  was then injected and demonstrated a filling defect in the proximal  ureter consistent with the patient's calculus seen on CT scan.  A 0.038  Sensor guidewire was then advanced passed the stone and into the renal  pelvis under fluoroscopic guidance without difficulty.  Based on the  small caliber of the ureteral orifice, it was decided that to proceed  with ureteroscopy, he would need to undergo balloon dilation.  Therefore, the 4 cm ureteral balloon dilator was passed over the wire  and inflated to 12 atmospheres of pressure.  This was inflated for a  total of 2 minutes and then let down.  The semi-rigid ureteroscope was  then advanced next to the wire and up into the proximal ureter without  difficulty.  The patient's stone was identified and the 365 micron fiber  for the holmium laser was then passed through the ureteroscope and into  the proximal ureter at a setting of 0.8 joules and 6 Hz.  The stone was  fragmented and these fragments were then removed with via the nitinol  basket.  Reinspection of the proximal ureter revealed no further sizable  stones, although the proximal ureter was somewhat difficult to visualize  all the way up to the renal pelvis.  At this point, it was decided to  place a ureteral stent.  The ureteroscope was withdrawn and a 22-French  cystoscope was placed with the wire back loaded over the cystoscope  sheath.  A 6 x 24 double-J ureteral stent was then  advanced over the  wire using Seldinger technique and appropriately positioned under  fluoroscopic and cystoscopic guidance.  The wire was then removed with a  good curl noted in the renal pelvis, as well as the bladder.  There was  noted to be mild bleeding from the prostatic urethra.  It was decided to  place a Foley catheter.  Therefore, a 16-French Foley catheter was  placed into the bladder and placed to straight drainage.  The patient  tolerated the procedure well without complications.  The patient's stone  was sent for analysis at [facility].      Heloise Purpura, MD  Electronically Signed     LB/MEDQ  D:  04/16/2008  T:  04/17/2008  Job:  [id number]

## 2010-07-06 NOTE — Assessment & Plan Note (Signed)
[facility]                        CARDIOLOGY OFFICE NOTE   Stephen Moses, Stephen Moses                     MRN:          [id number]  DATE:07/07/2008                            DOB:          August 27, 1945    CHIEF COMPLAINT:  I have got an aneurysm in my aortic root.   HISTORY OF PRESENT ILLNESS:  [name] is a very pleasant 65-  year-old white male who comes today referred by Dr. Carylon Moses for  evaluation of an incidentally picked up aortic root aneurysm of 5.6 cm  on a CT scan for kidney stone evaluation.   He has a history of recent hypertension being managed by Dr. Ouida Moses.  Other than age, there are really no other risk factors.   CT scan on April 12, 2008, showed an aneurysmal dilatation in the  aorta root measuring 5.6 cm.  There was no acute features.  There was no  pericardial effusion.  Incidental coronary artery atherosclerosis was  also noted.   He denies any chest discomfort, angina, or dyspnea on exertion.  He has  had no symptoms of TIAs or mini strokes.   PAST MEDICAL HISTORY:  He has a history of right shoulder surgery.  He  has had low back surgery as well.  He has had nephrolithiasis and has  received surgical therapy and shock therapy for this.   He is intolerant of CODEINE.   His current meds are amlodipine 2.5 mg a day which Dr. Ouida Moses started not  too long ago.   He does not smoke or drink or use any drugs.   SOCIAL HISTORY:  He works for [employer]  He is divorced.  He has no children.   His family history is positive for myocardial infarction in one brother.   REVIEW OF SYSTEMS:  He wears reading glasses.  He wears hearing aids.  He wears bottom and top dentures.  He has chronic arthritic problems,  particularly in his back and left hip.  Rest of review of systems are  negative.   PHYSICAL EXAMINATION:  VITAL SIGNS:  He is 5 feet 8 inches.  Weighs 206.  Blood pressure is 110/80 in the  right arm, pulse is 81 and regular.  HEENT:  Ruddy complexion.  PERRL.  Extraocular movements intact.  Sclerae are clear.  Facial symmetry is normal.  He has dentures.  Oral  mucosa is normal.  NECK:  Supple.  Carotid upstrokes are equal bilaterally with a soft  systolic sound on the right base of a carotid.  Thyroid is not enlarged.  Trachea is midline.  No deviation.  No pulsation.  CHEST:  Lungs are clear to auscultation and percussion.  Heart reveals a  nondisplaced PMI.  He has a normal S1 and a normal splitting S2.  A soft  diastolic murmur is heard along the left upper sternal border.  There is  no right ventricular lift.  ABDOMEN:  Soft.  There is no pulsatile mass or bruit.  There is no  hepatomegaly.  EXTREMITIES:  There are no cyanosis, clubbing, or edema.  Pulses are  brisk.  NEUROLOGIC:  Intact.  MUSCULOSKELETAL:  A slight limp on the left leg and he uses a cane.   His electrocardiogram shows sinus rhythm with left axis deviation.  Chest x-ray on April 16, 2008, showed normal-sized heart and clear  lungs.   ASSESSMENT:  1. Ascending aortic root aneurysm, asymptomatic, now measuring 5.6 cm.  2. Hypertension and new onset, well controlled.  3. Aortic regurgitation by physical examination.  4. Rule out carotid artery stenosis versus emanated sound from the      aortic valve and root.  5. Coronary atherosclerosis on CT scan.   PLAN:  1. Carotid Dopplers.  2. A 2-D echocardiogram to assess for degree of aortic insufficiency.  3. Cardiac catheterization with left ventriculogram, coronaries, and      aortic root shot.  4. Begin metoprolol 25 mg p.o. b.i.d.  5. Surgical consultation after the above diagnostic procedures.   INDICATIONS:  Risks and potential benefits have been discussed with the  patient.  He agrees to proceed.     Thomas C. Daleen Squibb, MD, [facility]  Electronically Signed    TCW/MedQ  DD: 07/07/2008  DT: 07/08/2008  Job #: [id number]   cc:   Stephen Moses. Stephen Sills,  MD

## 2010-07-06 NOTE — Discharge Summary (Signed)
NAME:  ESSEX, PERRY NO.:  [id number]   MEDICAL RECORD NO.:  [id number]          PATIENT TYPE:  INP   LOCATION:  2012                         FACILITY:  [facility]   PHYSICIAN:  Evelene Croon, M.D.     DATE OF BIRTH:  07/10/1945   DATE OF ADMISSION:  07/25/2008  DATE OF DISCHARGE:  07/30/2008                               DISCHARGE SUMMARY   ADMITTING DIAGNOSES:  1. Bicuspid aortic valve with mild aortic stenosis and ascending      aortic aneurysm.  2. History of back pain (status post back surgery in December 2009).  3. History of nephrolithiasis.   DISCHARGE DIAGNOSES:  1. Bicuspid aortic valve with mild aortic stenosis and ascending      aortic aneurysm.  2. History of back pain (status post back surgery in December 2009).  3. History of nephrolithiasis.   PROCEDURE:  Median sternotomy, placement of aortic valve in ascending  aortic root aneurysm using a 23-mm St. Jude mechanical valve conduit for  reimplantation of the coronary arteries by Dr. Laneta Simmers on July 25, 2008.   HISTORY OF PRESENT ILLNESS:  This is a 65 year old male who was  initially evaluated by Dr. Laneta Simmers in the office on Jul 15, 2008, for a  bicuspid aortic valve with ascending aortic root aneurysm.  According to  medical records, the patient was being evaluated for nephrolithiasis and  a CT scan showed a 5.6-cm ascending aortic root aneurysm.  This was  performed on April 12, 2008.  There were no signs of aortic  dissection.  The patient then underwent an echocardiogram on Jul 08, 2008, which showed a bicuspid aortic valve with a valve area of 2.1 sq  cm and an EF of 55-60%.  The mean gradient across the aortic valve was 8  mmHg and peak gradient was 13.  Also of note, there was mild tricuspid  regurgitation but no mitral stenosis or regurgitation.  The patient then  underwent a cardiac catheterization by Dr. Antoine Poche on Jul 11, 2008,  which showed no significant coronary artery disease, an  EF of 65%, as  well as an ascending aortic aneurysm.  The patient presented to [facility] on July 25, 2008, to undergo the aforementioned repair.   BRIEF HOSPITAL COURSE STAY:  The patient was extubated later in the  evening of surgery without difficulty.  Initially postop, he was sinus  bradycardia and A paced.  He was weaned off his drips as tolerated.  He  was also on an amiodarone drip for atrial fibrillation that was present  pre and post bypass.  Drip was discontinued, and he was placed on p.o.  amiodarone.  The patient was also weaned off his other drips.  He was  found to be volume overloaded and diuresed accordingly.  He was started  on Coumadin.  He was also found to have postoperative thrombocytopenia.  Gradually, his platelets began to increase.  His last platelet count on  July 29, 2008, was 137,000.  The patient was then transferred from ICU to  2000 for further convalescence.  He had initially been  started on  Lopressor.  He continued to be A paced, and he also had episodes of  bradycardia (heart rate into the 50s and 60s).  His Lopressor was  discontinued.  He had no further incidences of atrial fibrillation, and  because of the bradycardia, the amiodarone was also discontinued.  Currently on postop day #5, the patient is afebrile, pulse rate 67, BP  99/66, O2 sat 96% on room air.  Preoperative weight 94 kg, today's  weight 93 kg.   PHYSICAL EXAMINATION:  CARDIAC:  Sinus brady.  PULMONARY:  Clear to auscultation bilaterally.  ABDOMEN:  Benign.  EXTREMITIES:  No lower extremity edema.  Sternal incision is clean, dry,  and continuing to heal.   Epicardial pacing seen wires as well as chest tube sutures will be  removed today per Dr. Laneta Simmers.  The patient will then be discharged later  today.   LATEST LABORATORY STUDIES:  PT/INR done from today revealed a PT to be  22.1, INR to be 1.8 respectively.  Last CBC done on July 29, 2008, H and  H 10 and 29 respectively, blood  count 10 with platelet count of 137,000.  Last BMET also done on this date, potassium 3.7, BUN and creatinine 24  and 1.10 respectively.   Last chest x-ray done on July 29, 2008, showed increased left lower lobe  atelectasis and cardiomegaly but no pneumothorax.   DISCHARGE INSTRUCTIONS:  The patient is to remain on a low-fat, low-salt  diet.  He is not to drive or lift more than 10 pounds.  He is to  continue with his breathing exercise daily.  He is to walk every day and  increase in frequency and duration as tolerates.  He may shower and  cleanse his wounds with mild soap and water.  He is to call the office  if any wound problems arise.   FOLLOWUP APPOINTMENTS:  1. The patient needs to contact [facility] to have his      PT/INR drawn on August 01, 2008.  2. Needs to contact Dr. Vern Claude office for a followup appointment in 2      weeks.  3. He has an appointment to see Dr. Sharee Pimple physician assistant on      August 18, 2008, at 1:45, and prior to this office appointment, a      chest x-ray will be obtained.   DISCHARGE MEDICATIONS:  1. Enteric-coated aspirin 81 mg p.o. daily.  2. Coumadin 5 mg p.o. tonight (July 30, 2008), then 2.5 mg p.o. daily      or as directed.  3. Dilaudid 2 mg 1 tablet every 4-6 hours as needed for pain.      Doree Fudge, [place]      Evelene Croon, M.D.  Electronically Signed    DZ/MEDQ  D:  07/30/2008  T:  07/30/2008  Job:  [id number]   cc:   Thomas C. Daleen Squibb, MD, [facility]  Kingsley Callander. [name], MD

## 2010-07-06 NOTE — Cardiovascular Report (Signed)
NAME:  Stephen Moses, Stephen Moses NO.:  [id number]   MEDICAL RECORD NO.:  [id number]          PATIENT TYPE:  OIB   LOCATION:  1965                         FACILITY:  [facility]   PHYSICIAN:  Rollene Rotunda, MD, FACCDATE OF BIRTH:  [birth year]   DATE OF PROCEDURE:  07/11/2008  DATE OF DISCHARGE:  07/11/2008                            CARDIAC CATHETERIZATION   PRIMARY CARE PHYSICIAN:  Kingsley Callander. [name], MD   CARDIOLOGIST:  Jesse Sans. Wall, MD, [facility]   PROCEDURE:  Left heart catheterization.   INDICATIONS:  Evaluate the patient with aortic root aneurysm.  He will  require surgical repair of this.   PROCEDURE NOTE:  Left heart catheterization performed of the right  femoral artery.  The artery was cannulated using an anterior wall  puncture.  A 4-French arterial sheath was inserted via modified  Seldinger technique.  Preformed Judkins and pigtail catheter were  utilized.  Of note, we needed a Judkins 6 curve to cannulate the left  main and is very dilated root.   RESULTS:  Hemodynamics:  LV 131/22, AO 129/96.   Coronaries:  Left main was long and normal.  The LAD was large and  normal.  There was first diagonal, which was small and normal.  The  circumflex in the AV groove was codominant.  It was normal throughout  its course.  There was moderate size PDA, which was normal.  The right  coronary artery was moderate size codominant vessel.  It was normal  throughout its course.  The PDA of the right was moderate and normal.   Left ventriculogram:  The left ventriculogram was obtained in the RAO  projection.  The EF was 65% with normal wall motion.  An aortogram was  obtained and demonstrated diffuse aortic root, ascending aorta, and  aortic arch aneurysm.   CONCLUSION:  Normal coronary arteries.  Normal left ventricular  function.  Aortic aneurysm as described.   PLAN:  I have discussed the case with Dr. Evelene Croon.  We will obtain  a dedicated CT aortogram.  He will then be seen  by Dr. Laneta Simmers in  consultation.      Rollene Rotunda, MD, [facility]  Electronically Signed    JH/MEDQ  D:  07/11/2008  T:  07/12/2008  Job:  [id number]   cc:   Evelene Croon, M.D.  Kingsley Callander. [name], MD

## 2010-07-06 NOTE — Op Note (Signed)
NAME:  Stephen Moses, Stephen Moses NO.:  [id number]   MEDICAL RECORD NO.:  [id number]          PATIENT TYPE:  INP   LOCATION:  2315                         FACILITY:  [facility]   PHYSICIAN:  Evelene Croon, M.D.     DATE OF BIRTH:  02-24-1945   DATE OF PROCEDURE:  07/25/2008  DATE OF DISCHARGE:                               OPERATIVE REPORT   PREOPERATIVE DIAGNOSIS:  Bicuspid aortic valve with mild aortic stenosis  and ascending aortic aneurysm.   POSTOPERATIVE DIAGNOSIS:  Bicuspid aortic valve with mild aortic  stenosis and ascending aortic aneurysm.   OPERATIVE PROCEDURE:  Median sternotomy, extracorporeal circulation,  with placement of aortic valve and ascending aortic aneurysm using a 23-  mm St. Jude mechanical valve conduit with reimplantation of the coronary  arteries, deep hypothermic circulatory arrest.   ATTENDING SURGEON:  Evelene Croon, MD   ASSISTANT:  Sheliah Plane, MD   SECOND ASSISTANT:  Rowe Clack, PA-C   ANESTHESIA:  General endotracheal.   CLINICAL HISTORY:  This patient is a 65 year old gentleman who had a CT  scan performed for kidney stones, which showed the lower portion of his  chest.  This showed an ascending aortic aneurysm.  He had a CT angiogram  of the chest, which showed a 5.6-5.8 cm aneurysm of the ascending aorta  extending from the level of the aortic root up to the proximal aortic  arch.  Echocardiogram was performed, which showed bicuspid aortic valve  with mild aortic stenosis.  The valve area was calculated 2.1 cm2.  The  mean gradient was around 8 and peak gradient was around 16.  Left  ventricular function was normal.  He underwent cardiac catheterization,  which showed normal coronary arteries.  After review of these studies  and examination of the patient, it was felt that the best treatment was  placement of his aortic valve and ascending aortic aneurysm to prevent  the complications of aortic dissection and rupture.  I  discussed the  operative procedure with he and his family including use of a mechanical  valve conduit versus tissue valve conduit.  We discussed the pros and  cons of both and I recommended a mechanical valve conduit given his age  with normal left ventricular function and no coronary disease.  He had  no contraindication to anticoagulation.  I discussed the benefits and  risks of surgery with him including, but not limited to bleeding, blood  transfusion, infection, stroke, myocardial infarction, heart block  requiring permanent pacemaker, and death.  They understood and agreed to  proceed.   OPERATIVE PROCEDURE:  The patient was taken to the operating room and  placed on table in supine position.  After induction of general  endotracheal anesthesia, a Foley catheter was placed in bladder using  sterile technique.  Preoperative intravenous antibiotics were given.  Then, chest, abdomen, and both lower extremities were prepped and draped  in usual sterile manner.  Transesophageal echocardiogram was performed  by Anesthesiology.  This showed bicuspid aortic valve with mild aortic  stenosis and trivial aortic insufficiency.  There was a large ascending  aneurysm.  Left ventricular function appeared well preserved.  There was  trace-to-mild mitral regurgitation.   Then, the chest was opened through a median sternotomy incision, the  pericardium left in midline.  Examination of the heart showed good  ventricular contractility.  There was a large ascending aneurysm that  extended from the aortic root up to the proximal arch.  Then, the  patient was heparinized when an adequate activated clotting time was  achieved, the distal ascending aorta was cannulated using a 20-French  aortic cannula for arterial inflow.  Venous outflow was achieved using a  two-stage venous cannula through the right atrial appendage.  A left  ventricular vent was placed in the right superior pulmonary vein and a   retrograde cardioplegic cannula was inserted through the right atrium  into the coronary sinus.  A retrograde cerebral plegia cannula was  inserted through a pursestring suture in superior vena cava and the  superior vena cava was encircled with a tape.  The patient was then  placed on cardiopulmonary bypass and cooled to a nasopharyngeal  temperature of 18 degrees centigrade.  This took about 30 minutes.  The  patient was then placed in steep Trendelenburg position.  The head was  packed in ice and the patient was given intravenous Solu-Medrol and  Penetrol by Anesthesiology.  Then circulatory arrest was begun.  After  the venous line was removed, the superior vena cava was tightened and  occluded with a tape and retrograde cerebral plegia begun at about 200  mL per minute.  Then 500 mL of cold blood retrograde cardioplegia was  given.  Additional doses were given about every 20 minutes.  Myocardial  temperature was maintained at about 6 degrees centigrade.  The heart was  packed in ice and insulating pad placed in the pericardium.  A septal  probe was used.   Then, the aorta was transected at the level just before the take off the  innominate artery.  This line of transection was continued into the  undersurface of the arch.  The diameter of the aorta was about 3 cm in  this area.  Then, a Hemashield Platinum Woven Double Velour Vascular  Graft was chosen.  This was a 30-mm graft with a 10-mm sidearm.  It was  cut with the appropriate bevel and then was anastomosed end-to-end to  the undersurface of the aortic arch using continuous 3-0 Prolene suture  with felt strip to reinforce anastomosis.  This anastomosis was then  lightly coated with CoSeal for hemostasis.  Then the aortic line was  connected to the 10-mm sidearm.  Retrograde cerebral plegia was stopped.  The tape was loosened around superior vena cava and circulation was  reestablished.  The graft was clamped just proximal to the  sidearm.  Full circulation was restored.  Circulatory arrest time was 21 minutes.  The patient was then rewarmed to 32 degrees centigrade.   Then attention was turned to the more proximal aorta.  The aortic  aneurysm was opened longitudinally.  The location of the right and left  coronary ostia were identified.  The right coronary ostia was fairly  small and was located close to the annulus.  The left coronary ostium  was larger and was located about 2 cm above the level of the annulus.  The right and left coronary ostia were excised as buttons.  These were  retracted out of the way and care was taken not to twist them.  Then the  native valve was excised.  This was a bicuspid valve with mild  calcification of the leaflets.  There was marked enlargement of the  aortic sinuses.  The annulus was then measured and a 23-mm St. Jude  mechanical valve conduit was chosen.  I considered trying to upsize this  to a 25-mm conduit, but the aortic annulus was fairly thin and weak  particularly anteriorly where the right coronary ostium was very close  to the annulus.  I was concerned that a 25-mm valve may result in  stretching and tearing of the annulus.  The model number for this  conduit was [id number], serial number [id number].  Then, a series of  pledgeted 2-0 Ethibond horizontal mattress sutures were placed around  the annulus of the pledgets in the subannular position.  These sutures  were placed through a pericardial strip to reinforce the annulus and  then through the sewing ring on the valve.  The valve conduit was  lowered in place.  Sutures were tied sequentially.  This seated nicely.  The leaflets of the valve moved freely and there was no obstruction to  the subvalvular region.   Then, the left coronary anastomosis was performed.  The proper position  was measured on the aortic graft and a small opening was made using an  eye cautery.  The left coronary button was anastomosed with  continuous 5-  0 Prolene suture.  This anastomosis was lightly coated with CoSeal for  hemostasis.  Then the proper position of the right coronary anastomosis  was measured and a small opening made in the graft using an eye cautery.  The right coronary button was then anastomosed to the graft in an end-to-  side manner and continuous 5-0 Prolene suture.  This anastomosis was  also lightly coated with CoSeal.  Then, the Dacron graft was cut  proximal to the crossclamp leaving about a 1 cm length.  The proximal  conduit was then cut to the appropriate length and then an end-to-end  anastomosis was performed between the 2 grafts using continuous 3-0  Prolene suture.  This suture line was then coated with CoSeal for  hemostasis.  Then a McGoon needle was placed into the aortic graft  through a pursestring suture of 4-0 Prolene.  The left side of the heart  was de-aired.  Then, the head was placed in a Trendelenburg position and  the crossclamp removed with a total crossclamp time of 104 minutes.  There was spontaneous return of atrial fibrillation rhythm and the  patient was started on amiodarone and converted to sinus rhythm.  He had  had multiple episodes of atrial fibrillation pre-bypass and I felt it  would be best to start him on amiodarone.  Then, the retrograde  cardioplegia and left ventricular vent were removed.  Two temporary  right ventricular and right atrial pacing wires were placed above the  skin.  The suture lines were checked for gross hemostasis and looked  good.  Then after further de-airing maneuvers, the patient was weaned  from cardiopulmonary bypass on no inotropic agents.  Total bypass time  was 178 minutes.  Cardiac function looked good.  TEE showed preserved  left ventricular function.  There was some increase in the degree of  mitral regurgitation, which I attributed to some left ventricular  dysfunction post bypass as well as replacing his mildly stenotic aortic   valve with a 23-mm mechanical valve.  This gradually improved.  Then,  protamine was given and the  venous and aortic valves were removed  without difficulty.  Hemostasis was achieved without difficulty.  Two  chest tubes were placed into the posterior pericardium, one on the  anterior mediastinum.  The pericardium was loosely reapproximated over  the heart.  The sternum was closed #6 stainless steel wires.  Fascia was  closed with continuous #1 Vicryl suture.  Subcutaneous tissue was closed  with continuous 2-0 Vicryl and the skin with a 3-0 Vicryl subcuticular  closure.  The lower extremity vein harvest site was closed in layers in  similar manner.  The sponge, needle, and instrument were counts correct  according to the scrub nurse.  Dry sterile dressing was applied over the  incisions around the chest tubes, which were Pleur-Evac suction.  The  patient remained hemodynamically stable and was transferred to the SICU  in guarded, but stable condition.      Evelene Croon, M.D.  Electronically Signed     BB/MEDQ  D:  07/25/2008  T:  07/26/2008  Job:  [id number]   cc:   Thomas C. Wall, MD, [facility]

## 2010-07-06 NOTE — Consult Note (Signed)
NAME:  Stephen Moses, Stephen Moses NO.:  [id number]   MEDICAL RECORD NO.:  [id number]          PATIENT TYPE:  INP   LOCATION:  2315                         FACILITY:  [facility]   PHYSICIAN:  Zenon Mayo, MDDATE OF BIRTH:  [birth year]   DATE OF CONSULTATION:  07/25/2008  DATE OF DISCHARGE:                                 CONSULTATION   PROCEDURE:  Intraoperative transesophageal echocardiogram.   INDICATIONS:  Ascending aortic aneurysm.   DESCRIPTION OF PROCEDURE:  Mr. 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  He has no  coronary artery disease or other symptoms resulting from an aneurysm;  however, today he was brought to the operating room by Dr. Laneta Simmers for  repair of this aneurysm.  The patient was brought to the operating room  and placed under general anesthesia.  After confirmation of endotracheal  tube placement and orogastric suctioning, a 10 esophageal echoprobe was  placed into the patient's esophagus without complication.  The four-  chamber view of the heart revealed no pericardial effusions.  Pericardium looked normal.  No other obvious abnormalities in this view.  Upon full review of the left ventricle, it revealed that the ventricle  was of normal wall thickness.  There were no wall motion abnormalities  seen.  The ejection fraction was estimated to be 60%.  Looking at the  mitral valve next, the mitral valve moved well.  There were no  calcifications or vegetations seen.  The mitral annulus was free from  calcification as well and the leaflets appear to coapt well.  With the  exception of some mild domain of the posterior leaflet, the leaflets  appeared to be functioning appropriately.  However, when color Doppler  was placed across the valve, only a trace amount of mitral regurgitation  was seen.  This was seen in the central jet as well as other smaller  peripheral jets, but there were no  consequence.  The aortic valve was  imaged next and revealed a bicuspid valve with no vegetations or  calcifications noted.  The aortic valve was 3.37 cm2 and there was no  aortic insufficiency noted, but on measurement we got an annular  measurements of 3.07 cm and measured the sinuses are flat at 4.0 cm.  The aorta revealed a 5.58-cm aneurysm just distal to the valve and  continued throughout the extent of the arch.  There was minimal  atherosclerosis noted in the aortic arch as well as down into the  thoracic aorta.  The tricuspid valve was normal in structure and  function.  There did not appear to be any induration or septal defect.  The pulmonary valve was difficult to see.   At the conclusion bypass and after successful weaning from the  cardiopulmonary bypass machine, the heart was again imaged.  A 23-mm St.  Jude valve was placed into the aortic position and the leaflets appeared  to be moving well.  There did not appear to be any aortic insufficiency.  The trace mitral regurgitation that was seen prebypass was now of  moderate nature, probably secondary to increased afterload with a  smaller valve then was in place prior to the [facility]. Jude valve being  placed.  The left ventricle continued to remain vigorous with normal  ejection fraction and no wall motion abnormalities noted.  There do not  appear to be any other abnormalities from our prebypass exam.  At the  conclusion of the procedure, the echo probe was removed from the  patient's esophagus without any complication or evidence of trauma.  The  patient was taken directly from the operating room to the intensive care  unit in stable condition.          ______________________________  Zenon Mayo, MD    WEF/MEDQ  D:  07/25/2008  T:  07/26/2008  Job:  [id number]

## 2010-07-06 NOTE — H&P (Signed)
NAME:  NYXON, STRUPP NO.:  [id number]   MEDICAL RECORD NO.:  [id number]          PATIENT TYPE:  OBV   LOCATION:  A335                          FACILITY:  [facility]   PHYSICIAN:  Edward L. Juanetta Gosling, M.D.DATE OF BIRTH:  30-Apr-1945   DATE OF ADMISSION:  02/10/2008  DATE OF DISCHARGE:  LH                              HISTORY & PHYSICAL   [name] is a 65 year old who came to the emergency room with  complaint of back pain.  He said that he has been having it for some  time.  He had gone to Dr. Shelle Iron, his orthopedist.  He had received 2  steroid injections in the back, but he has continued having problems.  He was doing some maintenance work, reinjured his back.  It got much  worse and he had an MRI done last week that we cannot access, it was  done outside the system.  He does have an appointment today with Dr.  Shelle Iron.  However, he had so much pain that he was not able to get to  leave the emergency room.  He is not able to stand.  His leg is very  weak.  He says he cannot walk.  The pain is burning and he feels weak in  the leg and also has some numbness goes down his left buttocks.   PAST MEDICAL HISTORY:  Positive for osteoarthritis.  He has had bulging  disks in his back in the past.   SOCIAL HISTORY:  He does not smoke.  He does not use any alcohol.  He  does not use any illicit drugs.   ALLERGIES:  He is allergic to CODEINE, but he has been able to take  hydrocodone.   MEDICATIONS:  He has also been on meloxicam 7.5 mg daily.   REVIEW OF SYSTEMS:  Except as mentioned is negative.   FAMILY HISTORY:  Negative for any sort of problem with back, etc.   PHYSICAL EXAMINATION:  GENERAL:  A well-developed and well-nourished  male who is complaining of pain in his leg.  VITAL SIGNS:  His temperature is 98.3, pulse 67, respirations 20, blood  pressure 134/78, and O2 sats 95% on room air.  His height is 70 inches  and weight 89.7 kilos.  HEENT:  Pupils are reactive  to light and accommodation.  Mucous  membranes are moist.  NECK:  Supple without masses.  He does not have any bruits.  CHEST:  Clear without wheezes.  HEART:  Regular without murmur, gallop, or rub.  ABDOMEN:  Soft.  No masses are felt.  EXTREMITIES:  No edema.  He does have weakness of the left lower  extremity and some subjective numbness to light touch.   ASSESSMENT:  He has what I think is probably a ruptured disk in his  back.  We are going to see what we can do about getting Dr. Shelle Iron to see  him.  We will need to get the MRI report.      Edward L. Juanetta Gosling, M.D.  Electronically Signed     ELH/MEDQ  D:  02/11/2008  T:  02/11/2008  Job:  [id number]

## 2010-07-06 NOTE — Consult Note (Signed)
NEW PATIENT CONSULTATION   Stephen Moses, Stephen Moses  DOB:  1946/02/01                                        Jul 15, 2008  CHART #:  [id number]   REFERRING PHYSICIAN:  Jesse Sans. Wall, MD, FACC   REASON FOR CONSULTATION:  Bicuspid aortic valve with ascending aortic  aneurysm.   CLINICAL HISTORY:  I was asked by Dr. Antoine Poche and Dr. Daleen Squibb to evaluate  the patient for consideration of surgical treatment of the above  problem.  He is a 65 year old gentleman, who had a 5.6-cm ascending  aortic aneurysm diagnosed incidentally on CT scan that was performed for  evaluation of kidney stones.  He denies any symptoms.  The scan was done  on April 12, 2008, and I have reviewed that.  This does show an  ascending aortic aneurysm beginning at the aortic root level and  extending up to the proximal aortic arch.  There are no signs of aortic  dissection.  The patient underwent an echocardiogram on Jul 08, 2008,  which showed a bicuspid aortic valve with a valve area of 2.1 cm2.  Ejection fraction was 55-60% with normal wall thickness and cavity size.  Systolic function was normal.  The atrial septum was intact.  The mean  gradient across the aortic valve was 8 mmHg and the peak gradient of 13.  Right ventricular function was normal.  There was mild tricuspid  regurgitation.  There is no mitral regurgitation or stenosis.  The  patient subsequently underwent cardiac catheterization by Dr. Antoine Poche  on Jul 11, 2008, which showed no coronary artery disease with normal  ejection fraction as well as the ascending aortic aneurysm.   REVIEW OF SYSTEMS:  His review of systems is as follows.  General:  He  denies any fever or chills.  He has had no recent weight changes.  He  denies fatigue.  Eyes:  Negative.  ENT:  Negative.  Endocrine:  He  denies diabetes and hypothyroidism.  Cardiovascular:  He denies any  chest pain or pressure.  He has had exertional shortness of breath for  several  years and is unchanged.  He denies PND or orthopnea.  He has had  no peripheral edema or palpitations.  Respiratory:  He denies cough and  sputum production.  GI:  He denies nausea or vomiting.  He denies melena  and bright red blood per rectum.  GU:  He denies dysuria and hematuria.  Vascular:  He denies claudication and phlebitis.  Neurological:  He does  have some pain and numbness in his left leg related to lumbar back  surgery.  He denies any focal weakness.  He has had no history of TIA or  stroke.  Musculoskeletal:  He does have arthritis and has had problems  with his lower back.  He has persistent pain and numbness in his left  leg following spine surgery.  Psychiatric:  Negative.  Hematological:  Negative.  ENT:  He does have a history of hearing loss and uses 2  hearing aids.   ALLERGIES:  Codeine.   His medications are amlodipine 5 mg daily, Lopressor 25 mg b.i.d., Advil  p.r.n., and Tylenol p.r.n.   PAST MEDICAL HISTORY:  Significant for history of lumbar back surgery by  Dr. Shelle Iron in December 2009.  He said he has had persistent pain  and  numbness in his left leg since that time and has been told that he may  need further surgery.  He is status post right shoulder surgery about 2  years ago.  He has history of hearing loss.  He has a history of kidney  stones and has had surgery and lithotripsy for that.   SOCIAL HISTORY:  He works for [employer] in [place].  He  is divorced, but lives with his girlfriend of many years.  He has no  children.  He is a previous smoker, but quit in 1995.  He denies alcohol  abuse.   FAMILY HISTORY:  Positive for a brother who had myocardial infarction.  There is no history of aortic valve disease or aneurysmal disease.   PHYSICAL EXAMINATION:  VITAL SIGNS:  His blood pressure is 118/82.  His  pulse is 58 and regular.  His respiratory rate is 18 and unlabored.  GENERAL:  He is a well-developed white male in no distress.   HEENT:  Normocephalic and atraumatic.  Pupils are equal and reactive to  light and accommodation.  Extraocular muscles are intact.  Throat is  clear.  NECK:  Normal carotid pulses bilaterally.  There is a transmitted murmur  to both sides of his neck.  There is no JVD.  There is no adenopathy or  thyromegaly.  CARDIAC:  Regular rate and rhythm with a grade 2/6 systolic flow murmur  across his valve.  There is soft diastolic murmur.  LUNGS:  Clear.  ABDOMEN:  Active bowel sounds.  His abdomen is soft and nontender.  There are no palpable masses or organomegaly.  EXTREMITIES:  No peripheral edema.  Pedal pulses are palpable  bilaterally.  SKIN:  Warm and dry.  NEUROLOGIC:  Alert and oriented x3.  Motor and sensory exams grossly  normal.   IMPRESSION:  The patient has a bicuspid aortic valve with very mild  stenosis and a 5.6-cm ascending aortic aneurysm.  This extends out into  the proximal arch.  I think the best treatment is replacement of his  aortic valve and ascending aortic aneurysm using a mechanical valve  conduit.  This would require deep hypothermic circulatory arrest to  replace his proximal arch as well as re-implantation of his coronary  arteries into the graft.  I discussed the alternatives for replacement  of prosthesis including a mechanical valve conduit as well as a tissue  valve conduit.  We discussed the pros and cons of both, but I think a  mechanical valve conduit would be best at his age.  I discussed the need  for lifelong anticoagulation with Coumadin and he did not have any  problem with doing that.  I discussed the benefits and risks of surgery  including but not limited to bleeding, blood transfusion, infection,  stroke, myocardial infarction, heart block requiring permanent  pacemaker, organ failure, and death.  He understands all this and agrees  to proceed.  We will plan to do surgery next week.   Evelene Croon, M.D.  Electronically Signed   BB/MEDQ   D:  07/15/2008  T:  07/16/2008  Job:  [id number]   cc:   Thomas C. Daleen Squibb, MD, [facility]  Rollene Rotunda, MD, [facility]  Kingsley Callander. [name], MD

## 2010-07-09 NOTE — Consult Note (Signed)
NAME:  JAXDEN, BLYDEN NO.:  [id number]   MEDICAL RECORD NO.:  [id number]          PATIENT TYPE:   LOCATION:                                 FACILITY:   PHYSICIAN:  Lionel December, M.D.    DATE OF BIRTH:  1946-01-15   DATE OF CONSULTATION:  03/23/2004  DATE OF DISCHARGE:                                   CONSULTATION   REASON FOR CONSULTATION:  Heme-positive stool and to schedule colonoscopy.   REQUESTING PHYSICIAN:  Kingsley Callander. [name], M.D.   HISTORY OF PRESENT ILLNESS:  Mr. Faulkner is a 65 year old Caucasian gentleman  who presents today for further evaluation of heme-positive stool, to  schedule colonoscopy.  He states he has been feeling well except for a  chronic cough and sinusitis which he has had for over 12 weeks.  He has  taken multiple rounds of antibiotics.  Her recently was found to have heme-  positive stool at Dr. Alonza Smoker office.  He is due for colonoscopy given  family history of colon cancer.  His last colonoscopy was in November 1999  at which time he was found to have multiple small polyps, predominantly in  the rectum.  The largest one measured 45 mm in the splenic flexure and as  inflammatory.  In 1994, he had metaplastic polyp.   He denies any constipation, diarrhea, melena, rectal bleeding, abdominal  pain, nausea or vomiting.  He has had chronic gastroesophageal reflux  disease for years.  He had a remote EGD and tells me he was supposed to come  back for followup, but he never did.  He is unsure if he had any Barrett's  esophagus.  He has intermittent typical reflux symptoms based on dietary  indiscretions.  He takes over-the-counter antacids with good relief.   CURRENT MEDICATIONS:  1.  Aspirin 325 mg daily.  2.  Multivitamins daily.  3.  Rolaids p.r.n.   ALLERGIES:  no known drug allergies.   PAST MEDICAL HISTORY:  1.  Chronic gastroesophageal reflux disease.  2.  History of colonic polyps.   PAST SURGICAL HISTORY:  Surgery on a  finger.   FAMILY HISTORY:  Sister with leukemia.  He had a brother with colon cancer,  another brother with lung cancer, and one with leukemia.   SOCIAL HISTORY:  He is single.  He is employed with [employer].  He quit smoking 12 years ago.  He occasionally drinks a shot of  alcohol.   REVIEW OF SYSTEMS:  See HPI for GI.  CARDIOPULMONARY:  Denies any chest pain  or shortness of breath.  CONSTITUTIONAL:  Denies any weight loss, anorexia,  fatigue.   PHYSICAL EXAMINATION:  VITAL SIGNS:  Weight 219.  Temperature 97.9, blood  pressure 130/90, pulse 76.  GENERAL:  Pleasant, well-developed, well-nourished, Caucasian male in no  acute distress.  SKIN:  Warm and dry, no jaundice.  HEENT:  Conjunctivae are pink, sclerae nonicteric.  Pupils equal, round, and  reactive to light.  Oropharyngeal mucosa moist and pink, no lesions,  erythema, or exudate.  No lymphadenopathy or thyromegaly.  CHEST:  Lungs clear to auscultation.  CARDIAC:  Regular rate and rhythm.  Normal S1 and S2.  No murmurs, rubs, or  gallops.  ABDOMEN:  Positive bowel sounds.  Soft, nontender, nondistended.  No  organomegaly or masses.  EXTREMITIES:  No edema.   IMPRESSION:  Mr. Mccallum is a 65 year old gentleman who was recently found to  have hemoccult-positive stools.  He has a family history significant for  colon cancer in a brother.  His last colonoscopy was in November 1999 at  which time he had multiple inflammatory polyps.  In addition, he also has a  history of chronic gastroesophageal reflux disease with a remote  esophagogastroduodenoscopy.   I have offered EGD at this time to rule out complications such as Barrett's  esophagus, and he is very much interested in proceeding.   PLAN:  1.  EGD and colonoscopy in the near future.  2.  He will continue over-the-counter antacids for now.      LL/MEDQ  D:  03/23/2004  T:  03/23/2004  Job:  [id number]   cc:   Kingsley Callander. [name], MD  [address]  [place]  [place] [address]  Fax: [phone]

## 2010-07-09 NOTE — Op Note (Signed)
NAME:  Stephen Moses, Stephen Moses NO.:  [id number]   MEDICAL RECORD NO.:  [id number]          PATIENT TYPE:  AMB   LOCATION:  DAY                           FACILITY:  [facility]   PHYSICIAN:  Lionel December, M.D.    DATE OF BIRTH:  Oct 13, 1945   DATE OF PROCEDURE:  04/02/2004  DATE OF DISCHARGE:                                 OPERATIVE REPORT   PROCEDURE:  Esophagogastroduodenoscopy, followed by total colonoscopy.   INDICATION:  Stephen Moses is a 65 year old Caucasian male who was recently  found to have heme-positive stools.  Family history is positive for colon carcinoma in a brother.  His last  colonoscopy was in 1999 with removal of inflammatory polyps. He also has a  chronic GERD, using OTC medications. He takes OTC NSAIDs, but occasionally,  but he is on ASA 325 mg q.d..   He is undergoing both diagnostic EGD and high-risk screening colonoscopy.   He is therefore undergoing EGD as well as colonoscopy.  Procedure and risks  were reviewed with the patient and informed consent was obtained.   PREMEDICATION:  Cetacaine spray for oropharyngeal topical anesthesia,  Demerol 25 mg IV, Versed 5 mg IV in divided dose.   FINDINGS:  Procedures performed in endoscopy suite.  The patient's vital  signs and O2 saturation were monitored during the procedure and remained  stable.   PROCEDURE #1:  Esophagogastroduodenoscopy.  The patient was placed in left  lateral recumbent position and Olympus video scope was passed via oropharynx  without any difficulty into esophagus.   Esophagus:  Mucosa of the esophagus was normal.  The squamocolumnar junction  was at 43 cm from the incisors. There was no hernia.   Stomach:  It distended very well with insufflation.  Folds in proximal  stomach were normal.  Examination of the mucosa revealed patchy erythema at antrum with few  erosions. There was diffuse mucosal edema and diffuse involvement of mucosa  at body and fundus with erythema, edema and  a few red spots; however, there  were no erosions or AVMs.   Duodenum:  Bulbar mucosa revealed a patchy erythema and edema without  erosions or ulcers.  Mucosa in postbulbar duodenum was normal.  Endoscope  was withdrawn. The patient prepared for procedure #2.   Colonoscopy:  Rectal examination performed. No abnormality noted on external  or digital exam.  The Olympus video scope was placed rectum and advanced under vision into  sigmoid colon and beyond.  Preparation was satisfactory.  The scope was  passed into the cecum, which was identified by ileocecal valve and  appendiceal orifice.  Pictures taken for the record.  As the scope was  withdrawn, colonic mucosa was carefully examined and was normal throughout.  The rectal mucosa similarly was normal.  The scope was retroflexed to  examine the anorectal junction and small hemorrhoids were noted below the  dentate line.  Endoscope was straightened and withdrawn.  The patient  tolerated the procedure well.   FINAL DIAGNOSES:  1.  No endoscopic evidence of erosive esophagitis or Barrett's esophagus.  2.  Diffuse gastritis with antral  erosions.  3.  Bulbar duodenitis.  4.  Normal colonoscopy except small external hemorrhoids.   I suspect heme-positive stools may be secondary to GI blood loss from  stomach.   RECOMMENDATIONS:  1.  Antireflux measures. He can continues his OTC preparation for his      heartburn.  If he has it more than three times a week, would consider      PPI.  2.  H. pylori serology will be checked today.  3.  Given his family history, he should return for another colonoscopy in      five years from now.      NR/MEDQ  D:  04/02/2004  T:  04/02/2004  Job:  [id number]   cc:   Kingsley Callander. [name], MD  [address]  [place]  [place] [address]  Fax: [phone]

## 2010-07-16 ENCOUNTER — Ambulatory Visit (INDEPENDENT_AMBULATORY_CARE_PROVIDER_SITE_OTHER): Payer: BC Managed Care – PPO | Admitting: *Deleted

## 2010-07-16 DIAGNOSIS — Z954 Presence of other heart-valve replacement: Secondary | ICD-10-CM

## 2010-07-16 DIAGNOSIS — I359 Nonrheumatic aortic valve disorder, unspecified: Secondary | ICD-10-CM

## 2010-07-16 DIAGNOSIS — Z7901 Long term (current) use of anticoagulants: Secondary | ICD-10-CM

## 2010-07-16 LAB — POCT INR: INR: 2.3

## 2010-07-16 NOTE — Patient Instructions (Signed)
Continue 1 tablet on Wednesdays and Fridays and 0.5 tablet all other days Recheck in 3 weeks

## 2010-07-20 ENCOUNTER — Encounter: Payer: BC Managed Care – PPO | Admitting: *Deleted

## 2010-08-06 ENCOUNTER — Encounter: Payer: BC Managed Care – PPO | Admitting: *Deleted

## 2010-08-13 ENCOUNTER — Ambulatory Visit (INDEPENDENT_AMBULATORY_CARE_PROVIDER_SITE_OTHER): Payer: BC Managed Care – PPO | Admitting: *Deleted

## 2010-08-13 DIAGNOSIS — I359 Nonrheumatic aortic valve disorder, unspecified: Secondary | ICD-10-CM

## 2010-08-13 DIAGNOSIS — Z954 Presence of other heart-valve replacement: Secondary | ICD-10-CM

## 2010-08-13 DIAGNOSIS — Z7901 Long term (current) use of anticoagulants: Secondary | ICD-10-CM

## 2010-08-13 NOTE — Patient Instructions (Signed)
INR 3.7 TODAY HOLD TODAY'S DOSE THEN RESUME CURRENT DOSE

## 2010-08-27 ENCOUNTER — Ambulatory Visit (INDEPENDENT_AMBULATORY_CARE_PROVIDER_SITE_OTHER): Payer: BC Managed Care – PPO | Admitting: Cardiology

## 2010-08-27 ENCOUNTER — Encounter: Payer: Self-pay | Admitting: Cardiology

## 2010-08-27 DIAGNOSIS — I359 Nonrheumatic aortic valve disorder, unspecified: Secondary | ICD-10-CM

## 2010-08-27 DIAGNOSIS — Z954 Presence of other heart-valve replacement: Secondary | ICD-10-CM

## 2010-08-27 NOTE — Progress Notes (Signed)
HPI Stephen Moses returns today for followup of his aortic valve disorder and status post aortic valve replacement.  Denying any palpitations, presyncope or angina. He has had for about a year recurrent episodes of tingling in his left arm he describes as a  fuzzy sensation. Shortly after this, he develops a dull ache in his left upper chest and shoulder and sometimes feels low nauseated. He then proceeds to bring up clear liquid and has a bitter taste. He denies any shortness of breath or diaphoresis. He denies any chronic cough.  This can be associated with exertion but can happen at rest. He was given sublingual nitroglycerin which did not help. There is no history of coronary disease.  He is not a very good historian and it makes it difficult to follow his story.  He is very compliant with his medications his Coumadin. He is followed in the Coumadin clinic here.   No past medical history on file.  No past surgical history on file.  No family history on file.  History   Social History  . Marital Status: Married    Spouse Name: N/A    Number of Children: N/A  . Years of Education: N/A   Occupational History  . Not on file.   Social History Main Topics  . Smoking status: Former Games developer  . Smokeless tobacco: Not on file   Comment: quit 42yrs ago  . Alcohol Use: Not on file  . Drug Use: Not on file  . Sexually Active: Not on file   Other Topics Concern  . Not on file   Social History Narrative  . No narrative on file    Allergies  Allergen Reactions  . Codeine     Current Outpatient Prescriptions  Medication Sig Dispense Refill  . metoprolol succinate (TOPROL-XL) 25 MG 24 hr tablet Take 25 mg by mouth daily.       [name] warfarin (COUMADIN) 10 MG tablet Take by mouth as directed.          ROS Negative other than HPI.   PE General Appearance: well developed, well nourished in no acute distress HEENT: symmetrical face, PERRLA, good dentition  Neck: no JVD,  thyromegaly, or adenopathy, trachea midline Chest: symmetric without deformity Cardiac: PMI non-displaced, RRR, normal S1, S2, Systolic murmur along the left sternal border, metallic second heart sound splits, no diastolic murmur heard. Lung: clear to ausculation and percussion Vascular: all pulses full without bruits  Abdominal: nondistended, nontender, good bowel sounds, no HSM, no bruits Extremities: no cyanosis, clubbing or edema, no sign of DVT, no varicosities  Skin: normal color, no rashes Neuro: alert and oriented x 3, non-focal Pysch: normal affect Filed Vitals:   08/27/10 1157  BP: 135/72  Pulse: 55  Height: 5\' 8"  (1.727 m)  Weight: 182 lb (82.555 kg)  SpO2: 97%    EKG  Labs and Studies Reviewed.   Lab Results  Component Value Date   WBC 10.1 07/29/2008   HGB 14.1 10/29/2008   HCT 41.1 10/29/2008   MCV 87.8 07/29/2008   PLT 137* 07/29/2008      Chemistry      Component Value Date/Time   NA 140 10/29/2008 1123   K 4.3 10/29/2008 1123   CL 107 10/29/2008 1123   CO2 27 10/29/2008 1123   BUN 14 10/29/2008 1123   CREATININE 1.12 10/29/2008 1123      Component Value Date/Time   CALCIUM 9.6 10/29/2008 1123   ALKPHOS 51 07/18/2008 0951  AST 18 07/18/2008 0951   ALT 17 07/18/2008 0951   BILITOT 0.4 07/18/2008 0951       No results found for this basename: CHOL   No results found for this basename: HDL   No results found for this basename: LDLCALC   No results found for this basename: TRIG   No results found for this basename: CHOLHDL   Lab Results  Component Value Date   HGBA1C  Value: 5.4 (NOTE) The ADA recommends the following therapeutic goal for glycemic control related to Hgb A1c measurement: Goal of therapy: <6.5 Hgb A1c  Reference: American Diabetes Association: Clinical Practice Recommendations 2010, Diabetes Care, 2010, 33: (Suppl  1). 07/18/2008   Lab Results  Component Value Date   ALT 17 07/18/2008   AST 18 07/18/2008   ALKPHOS 51 07/18/2008   BILITOT 0.4  07/18/2008   No results found for this basename: TSH

## 2010-08-27 NOTE — Patient Instructions (Signed)
**Note De-identified Stephen Moses Obfuscation** Your physician recommends that you continue on your current medications as directed. Please refer to the Current Medication list given to you today.  Your physician recommends that you schedule a follow-up appointment in: 1 year  

## 2010-08-27 NOTE — Assessment & Plan Note (Signed)
He is stable from his aortic valve disease and history of aortic valve replacement. I am not sure what's causing the recurrent symptoms described in the history of present illness but I do not think is cardiac. He is a very difficult historian. He does not respond to nitroglycerin and doesn't really sound like ischemic heart disease. He may have some reflux issues. He is also told Dr. [name] about this who also wasn't sure what it was. I have made no further recommendations other than the fact that would not take nitroglycerin for it. His daughter has actually gotten rid of that medication. I will see him back in the year. No indication for an echocardiogram at this time.

## 2010-08-29 IMAGING — CR DG CHEST 2V
2 series · 2 of 2 positions shown · non-contrast
Comparison: 04/16/2008

CLINICAL DATA: Preadmission for ascending aneurysm

CHEST - 2 VIEW

[view not recorded (1 of 2)]
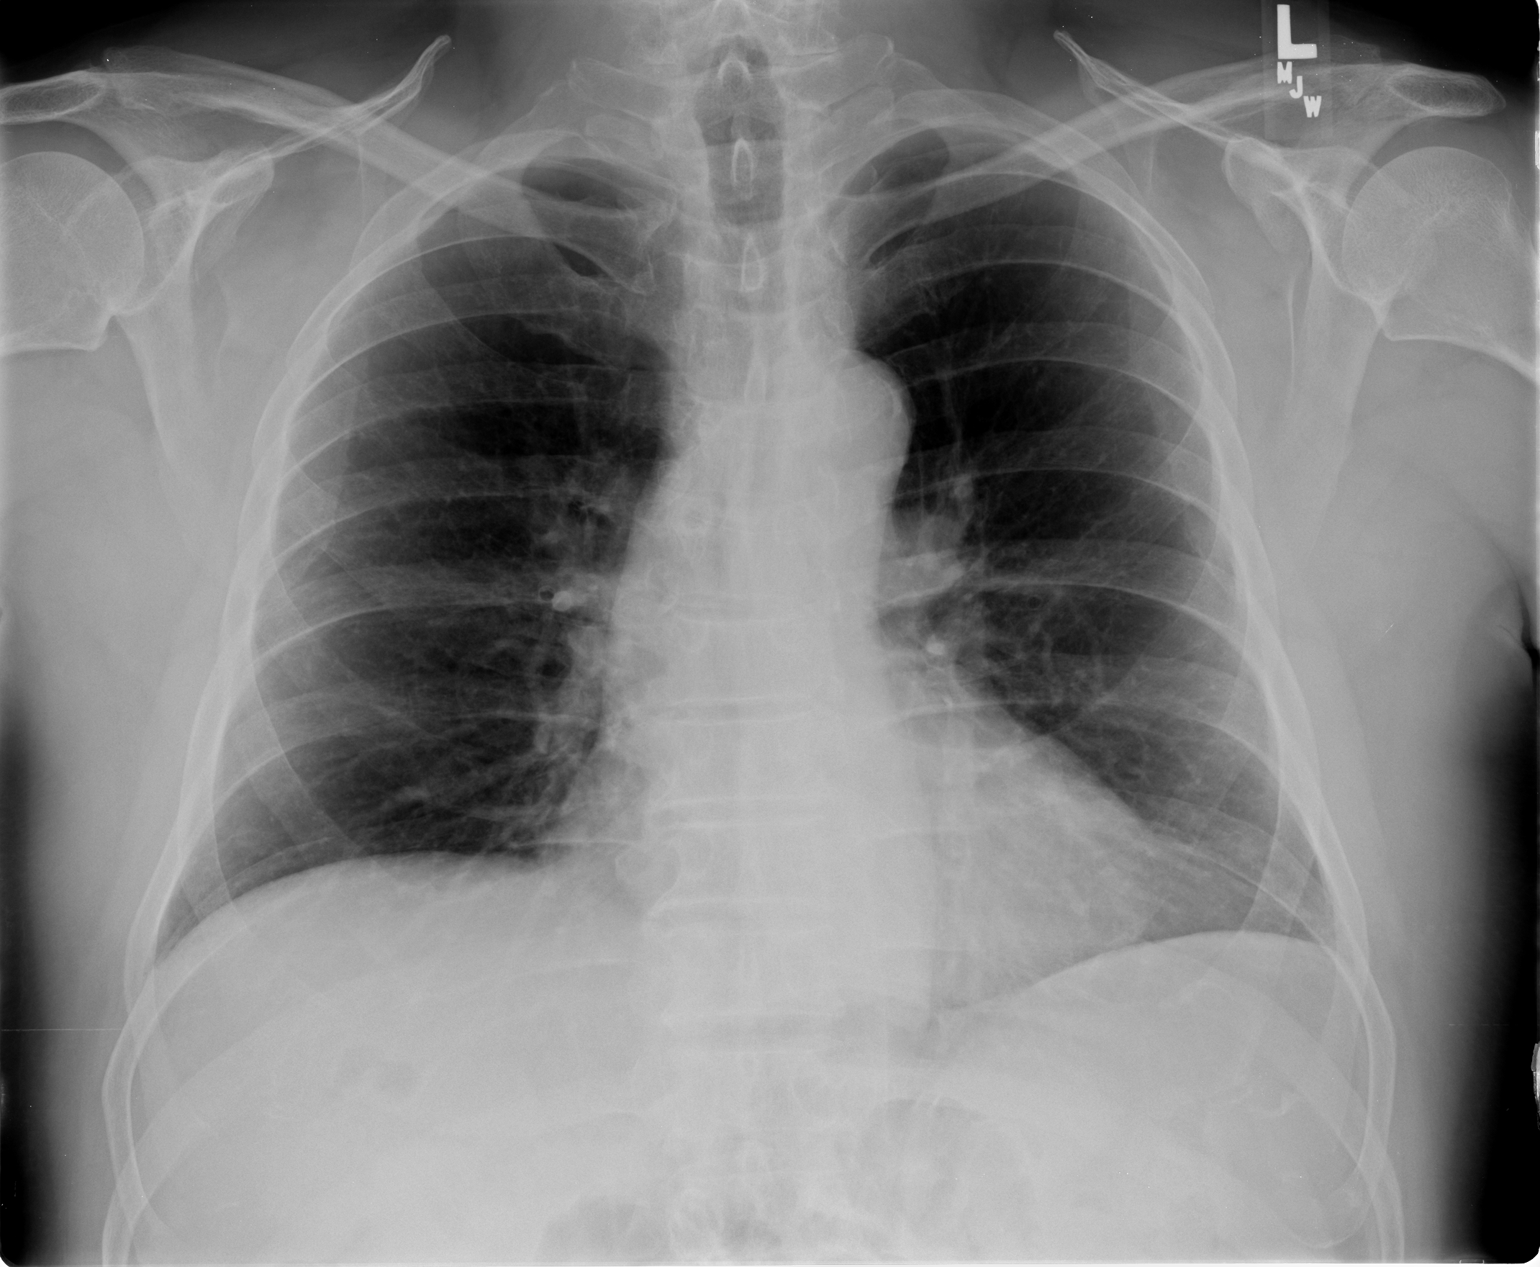

[view not recorded (2 of 2)]
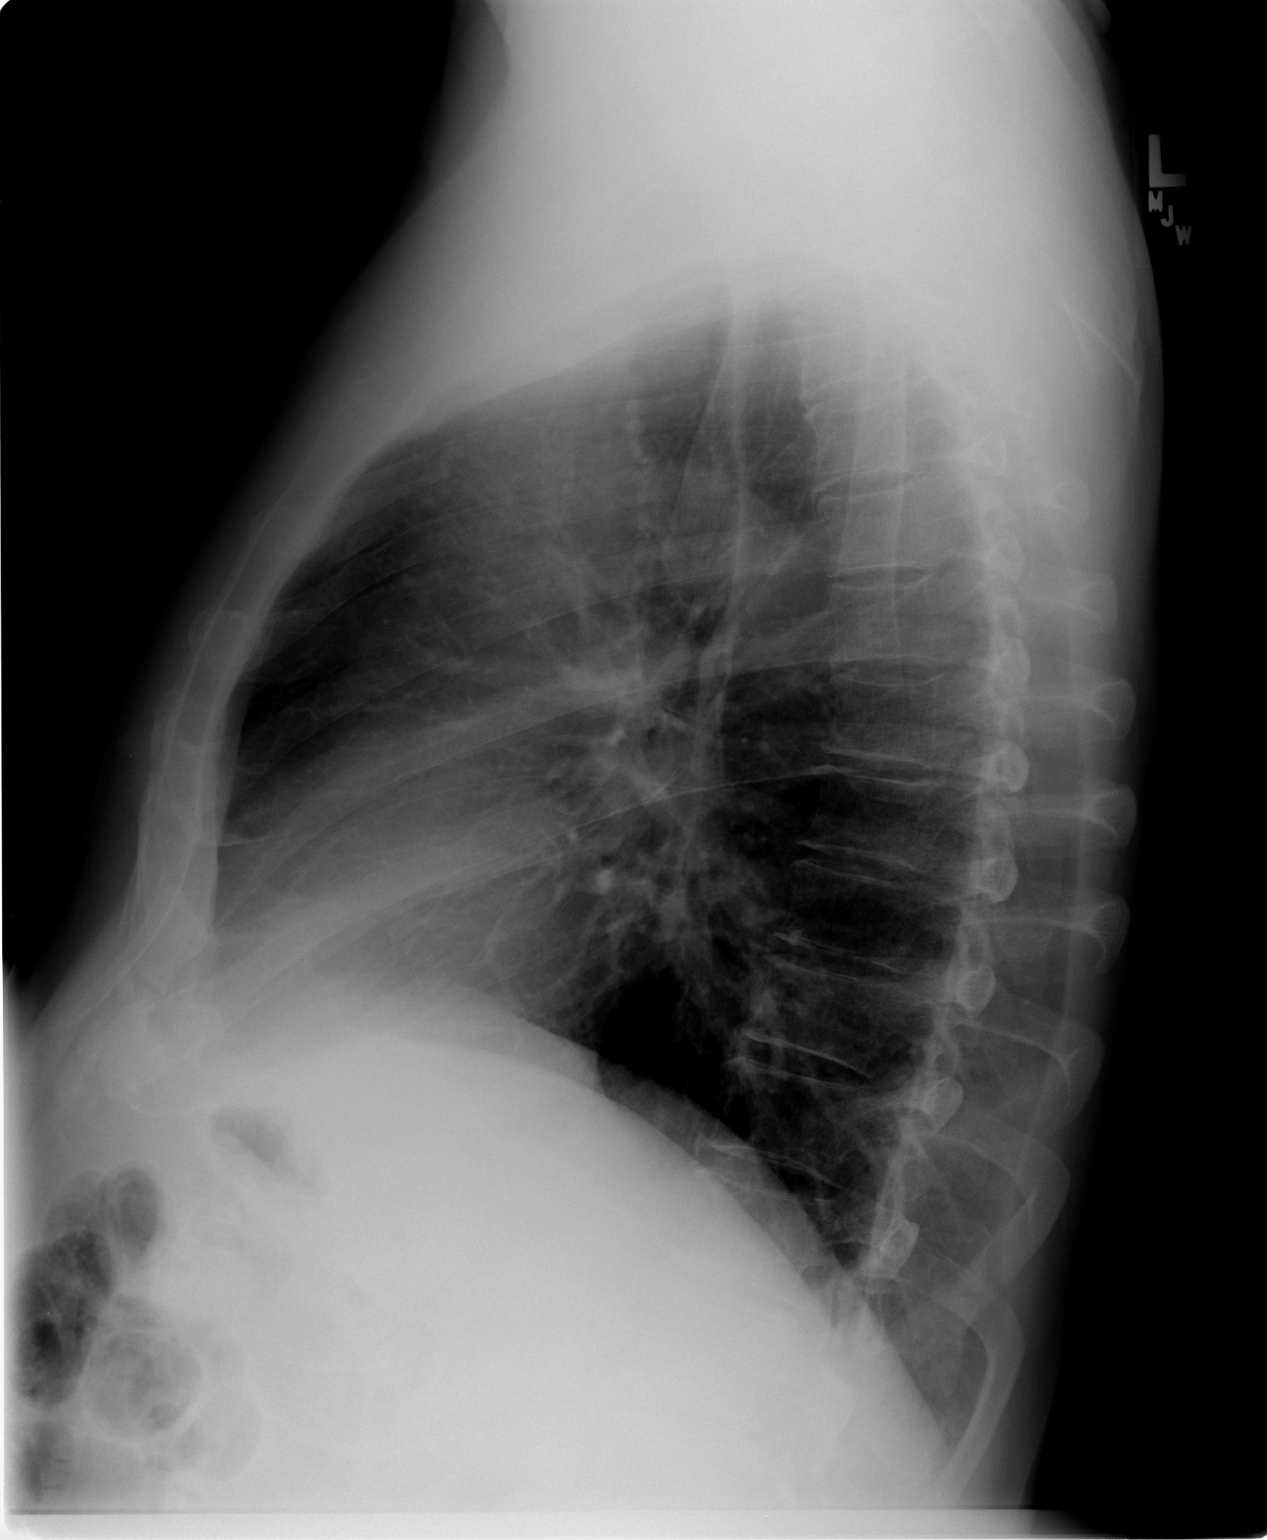

[2 of 2 positions shown; findings below may reference images not displayed]

FINDINGS: Cardiomediastinal silhouette is stable.  No acute
infiltrate or pleural effusion.  No pulmonary edema.  Mild
degenerative changes thoracic spine.
IMPRESSION: No active disease.  Mild degenerative changes thoracic spine.

## 2010-09-03 ENCOUNTER — Encounter: Payer: Self-pay | Admitting: Cardiology

## 2010-09-17 ENCOUNTER — Encounter: Payer: BC Managed Care – PPO | Admitting: *Deleted

## 2010-09-24 ENCOUNTER — Ambulatory Visit (INDEPENDENT_AMBULATORY_CARE_PROVIDER_SITE_OTHER): Payer: BC Managed Care – PPO | Admitting: *Deleted

## 2010-09-24 DIAGNOSIS — Z954 Presence of other heart-valve replacement: Secondary | ICD-10-CM

## 2010-09-24 DIAGNOSIS — I359 Nonrheumatic aortic valve disorder, unspecified: Secondary | ICD-10-CM

## 2010-09-24 DIAGNOSIS — Z7901 Long term (current) use of anticoagulants: Secondary | ICD-10-CM

## 2010-09-24 LAB — POCT INR: INR: 3.2

## 2010-09-24 NOTE — Patient Instructions (Signed)
See anticoagulation flow sheet.

## 2010-10-12 ENCOUNTER — Other Ambulatory Visit: Payer: Self-pay | Admitting: Adult Health

## 2010-10-29 ENCOUNTER — Encounter: Payer: BC Managed Care – PPO | Admitting: *Deleted

## 2010-11-03 ENCOUNTER — Ambulatory Visit (INDEPENDENT_AMBULATORY_CARE_PROVIDER_SITE_OTHER): Payer: BC Managed Care – PPO | Admitting: *Deleted

## 2010-11-03 DIAGNOSIS — Z954 Presence of other heart-valve replacement: Secondary | ICD-10-CM

## 2010-11-03 DIAGNOSIS — I359 Nonrheumatic aortic valve disorder, unspecified: Secondary | ICD-10-CM

## 2010-11-03 DIAGNOSIS — Z7901 Long term (current) use of anticoagulants: Secondary | ICD-10-CM

## 2010-11-03 LAB — POCT INR: INR: 3

## 2010-11-26 LAB — URINALYSIS, MICROSCOPIC ONLY
Glucose, UA: NEGATIVE mg/dL
Leukocytes, UA: NEGATIVE
Nitrite: NEGATIVE
Specific Gravity, Urine: 1.025 (ref 1.005–1.030)
pH: 6 (ref 5.0–8.0)

## 2010-11-26 LAB — COMPREHENSIVE METABOLIC PANEL
ALT: 23 U/L (ref 0–53)
AST: 22 U/L (ref 0–37)
Albumin: 3.3 g/dL — ABNORMAL LOW (ref 3.5–5.2)
CO2: 25 mEq/L (ref 19–32)
Calcium: 9.4 mg/dL (ref 8.4–10.5)
Chloride: 105 mEq/L (ref 96–112)
Creatinine, Ser: 1.07 mg/dL (ref 0.4–1.5)
GFR calc Af Amer: >60 mL/min (ref >60)
GFR calc non Af Amer: >60 mL/min (ref >60)
Sodium: 136 mEq/L (ref 135–145)
Total Bilirubin: 0.8 mg/dL (ref 0.3–1.2)

## 2010-11-26 LAB — APTT: aPTT: 28 seconds (ref 24–37)

## 2010-11-26 LAB — CBC
Platelets: 295 10*3/uL (ref 150–400)
RDW: 13.7 % (ref 11.5–15.5)

## 2010-12-10 ENCOUNTER — Ambulatory Visit (INDEPENDENT_AMBULATORY_CARE_PROVIDER_SITE_OTHER): Payer: BC Managed Care – PPO | Admitting: *Deleted

## 2010-12-10 DIAGNOSIS — Z7901 Long term (current) use of anticoagulants: Secondary | ICD-10-CM

## 2010-12-10 DIAGNOSIS — Z954 Presence of other heart-valve replacement: Secondary | ICD-10-CM

## 2010-12-10 DIAGNOSIS — I359 Nonrheumatic aortic valve disorder, unspecified: Secondary | ICD-10-CM

## 2011-01-14 ENCOUNTER — Ambulatory Visit (INDEPENDENT_AMBULATORY_CARE_PROVIDER_SITE_OTHER): Payer: BC Managed Care – PPO | Admitting: *Deleted

## 2011-01-14 DIAGNOSIS — I359 Nonrheumatic aortic valve disorder, unspecified: Secondary | ICD-10-CM

## 2011-01-14 DIAGNOSIS — Z7901 Long term (current) use of anticoagulants: Secondary | ICD-10-CM

## 2011-01-14 DIAGNOSIS — Z954 Presence of other heart-valve replacement: Secondary | ICD-10-CM

## 2011-01-14 MED ORDER — WARFARIN SODIUM 10 MG PO TABS: 10.0000 mg | ORAL_TABLET | ORAL | Status: DC

## 2011-02-11 ENCOUNTER — Ambulatory Visit (INDEPENDENT_AMBULATORY_CARE_PROVIDER_SITE_OTHER): Payer: BC Managed Care – PPO | Admitting: *Deleted

## 2011-02-11 DIAGNOSIS — Z7901 Long term (current) use of anticoagulants: Secondary | ICD-10-CM

## 2011-02-11 DIAGNOSIS — Z954 Presence of other heart-valve replacement: Secondary | ICD-10-CM

## 2011-02-11 DIAGNOSIS — I359 Nonrheumatic aortic valve disorder, unspecified: Secondary | ICD-10-CM

## 2011-02-11 LAB — POCT INR: INR: 3

## 2011-02-21 ENCOUNTER — Ambulatory Visit (INDEPENDENT_AMBULATORY_CARE_PROVIDER_SITE_OTHER): Payer: BC Managed Care – PPO | Admitting: *Deleted

## 2011-02-21 ENCOUNTER — Encounter: Payer: Self-pay | Admitting: *Deleted

## 2011-02-21 DIAGNOSIS — Z7901 Long term (current) use of anticoagulants: Secondary | ICD-10-CM

## 2011-02-21 DIAGNOSIS — I359 Nonrheumatic aortic valve disorder, unspecified: Secondary | ICD-10-CM

## 2011-02-21 DIAGNOSIS — Z954 Presence of other heart-valve replacement: Secondary | ICD-10-CM

## 2011-03-10 ENCOUNTER — Telehealth: Payer: Self-pay | Admitting: Cardiology

## 2011-03-10 ENCOUNTER — Ambulatory Visit (INDEPENDENT_AMBULATORY_CARE_PROVIDER_SITE_OTHER): Payer: BC Managed Care – PPO | Admitting: *Deleted

## 2011-03-10 DIAGNOSIS — Z954 Presence of other heart-valve replacement: Secondary | ICD-10-CM

## 2011-03-10 DIAGNOSIS — I359 Nonrheumatic aortic valve disorder, unspecified: Secondary | ICD-10-CM

## 2011-03-10 DIAGNOSIS — Z7901 Long term (current) use of anticoagulants: Secondary | ICD-10-CM

## 2011-03-10 NOTE — Telephone Encounter (Signed)
Patient has been having nose bleeds.  States that PCP advised patient to call Coumadin Clinic Nurse. / tg

## 2011-03-11 NOTE — Telephone Encounter (Signed)
Pt came in for INR check.  See coumadin note of 03/10/11

## 2011-03-14 ENCOUNTER — Encounter: Payer: BC Managed Care – PPO | Admitting: *Deleted

## 2011-03-18 ENCOUNTER — Encounter: Payer: BC Managed Care – PPO | Admitting: *Deleted

## 2011-04-08 ENCOUNTER — Encounter: Payer: BC Managed Care – PPO | Admitting: *Deleted

## 2011-04-14 ENCOUNTER — Other Ambulatory Visit: Payer: Self-pay | Admitting: Adult Health

## 2011-04-15 ENCOUNTER — Ambulatory Visit (INDEPENDENT_AMBULATORY_CARE_PROVIDER_SITE_OTHER): Payer: BC Managed Care – PPO | Admitting: *Deleted

## 2011-04-15 DIAGNOSIS — Z7901 Long term (current) use of anticoagulants: Secondary | ICD-10-CM

## 2011-04-15 DIAGNOSIS — Z954 Presence of other heart-valve replacement: Secondary | ICD-10-CM

## 2011-04-15 DIAGNOSIS — I359 Nonrheumatic aortic valve disorder, unspecified: Secondary | ICD-10-CM

## 2011-04-15 LAB — POCT INR: INR: 2.5

## 2011-04-25 ENCOUNTER — Encounter: Payer: Self-pay | Admitting: Cardiology

## 2011-05-23 ENCOUNTER — Ambulatory Visit (INDEPENDENT_AMBULATORY_CARE_PROVIDER_SITE_OTHER): Payer: BC Managed Care – PPO | Admitting: *Deleted

## 2011-05-23 DIAGNOSIS — Z954 Presence of other heart-valve replacement: Secondary | ICD-10-CM

## 2011-05-23 DIAGNOSIS — I359 Nonrheumatic aortic valve disorder, unspecified: Secondary | ICD-10-CM

## 2011-05-23 DIAGNOSIS — Z7901 Long term (current) use of anticoagulants: Secondary | ICD-10-CM

## 2011-05-23 LAB — POCT INR: INR: 2.9

## 2011-07-04 ENCOUNTER — Ambulatory Visit (INDEPENDENT_AMBULATORY_CARE_PROVIDER_SITE_OTHER): Payer: BC Managed Care – PPO | Admitting: *Deleted

## 2011-07-04 DIAGNOSIS — Z7901 Long term (current) use of anticoagulants: Secondary | ICD-10-CM

## 2011-07-04 DIAGNOSIS — Z954 Presence of other heart-valve replacement: Secondary | ICD-10-CM

## 2011-07-04 DIAGNOSIS — I359 Nonrheumatic aortic valve disorder, unspecified: Secondary | ICD-10-CM

## 2011-07-04 LAB — POCT INR: INR: 2.6

## 2011-07-12 ENCOUNTER — Telehealth: Payer: Self-pay | Admitting: Cardiology

## 2011-07-12 NOTE — Telephone Encounter (Signed)
Error

## 2011-07-12 NOTE — Telephone Encounter (Signed)
**Note De-Identified Stephen Moses Obfuscation** Pt. was last seen by Dr. Daleen Squibb on 08-27-10 for followup of his aortic valve disorder and status post aortic valve replacement and was advised to f/u in 1 year. Please advise./LV

## 2011-07-12 NOTE — Telephone Encounter (Signed)
Patient is to have eye lid surgery on June 28th.  Needs okay to stop Warfarin 5 days prior to procedure. Please fax okay to [facility] at [phone] Attn:Kathy Bolling. / tg

## 2011-07-15 NOTE — Telephone Encounter (Signed)
Schedule me to see in July.

## 2011-07-15 NOTE — Telephone Encounter (Signed)
Yes it is. Overlap with Lovenox after his procedure.

## 2011-07-15 NOTE — Telephone Encounter (Signed)
**Note De-Identified Laressa Bolinger Obfuscation** Clarification: is it ok for pt. to hold Coumadin for 5 days prior to planned surgery on June 28 or do you want to see him first?  Please advise./LV

## 2011-08-12 ENCOUNTER — Ambulatory Visit (INDEPENDENT_AMBULATORY_CARE_PROVIDER_SITE_OTHER): Payer: BC Managed Care – PPO | Admitting: *Deleted

## 2011-08-12 DIAGNOSIS — Z7901 Long term (current) use of anticoagulants: Secondary | ICD-10-CM

## 2011-08-12 DIAGNOSIS — I359 Nonrheumatic aortic valve disorder, unspecified: Secondary | ICD-10-CM

## 2011-08-12 DIAGNOSIS — Z954 Presence of other heart-valve replacement: Secondary | ICD-10-CM

## 2011-08-12 LAB — POCT INR: INR: 2.9

## 2011-08-12 NOTE — Patient Instructions (Signed)
6/28 Surgery am: Coumadin 10mg  pm 6/29  Lovenox 120mg  am, coumadin 10mg  pm 6/30  Lovenox 120mg  am, coumadin 10mg  pm 7/1  Lovenox 120mg  am, coumadin 10mg  pm 7/2  Lovenox 120mg  am, coumadin 10mg  pm 7/3  Lovenox and INR check Rx Lovenox 120mg  sq daily #10 called to [place] at [facility]

## 2011-08-16 ENCOUNTER — Encounter: Payer: Self-pay | Admitting: Cardiology

## 2011-08-24 ENCOUNTER — Ambulatory Visit (INDEPENDENT_AMBULATORY_CARE_PROVIDER_SITE_OTHER): Payer: BC Managed Care – PPO | Admitting: *Deleted

## 2011-08-24 DIAGNOSIS — Z954 Presence of other heart-valve replacement: Secondary | ICD-10-CM

## 2011-08-24 DIAGNOSIS — I359 Nonrheumatic aortic valve disorder, unspecified: Secondary | ICD-10-CM

## 2011-08-24 DIAGNOSIS — Z7901 Long term (current) use of anticoagulants: Secondary | ICD-10-CM

## 2011-08-24 LAB — POCT INR: INR: 2.8

## 2011-09-13 ENCOUNTER — Other Ambulatory Visit: Payer: Self-pay | Admitting: Cardiology

## 2011-09-13 MED ORDER — WARFARIN SODIUM 10 MG PO TABS: 10.0000 mg | ORAL_TABLET | ORAL | Status: DC

## 2011-09-16 ENCOUNTER — Encounter: Payer: Self-pay | Admitting: Cardiology

## 2011-09-16 ENCOUNTER — Ambulatory Visit (INDEPENDENT_AMBULATORY_CARE_PROVIDER_SITE_OTHER): Payer: BC Managed Care – PPO | Admitting: *Deleted

## 2011-09-16 ENCOUNTER — Ambulatory Visit (INDEPENDENT_AMBULATORY_CARE_PROVIDER_SITE_OTHER): Payer: BC Managed Care – PPO | Admitting: Cardiology

## 2011-09-16 VITALS — BP 134/80 | HR 52 | Ht 68.0 in | Wt 186.0 lb

## 2011-09-16 DIAGNOSIS — I359 Nonrheumatic aortic valve disorder, unspecified: Secondary | ICD-10-CM

## 2011-09-16 DIAGNOSIS — I10 Essential (primary) hypertension: Secondary | ICD-10-CM

## 2011-09-16 DIAGNOSIS — Z7901 Long term (current) use of anticoagulants: Secondary | ICD-10-CM

## 2011-09-16 DIAGNOSIS — Z954 Presence of other heart-valve replacement: Secondary | ICD-10-CM

## 2011-09-16 DIAGNOSIS — I712 Thoracic aortic aneurysm, without rupture: Secondary | ICD-10-CM | POA: Insufficient documentation

## 2011-09-16 LAB — POCT INR: INR: 2.6

## 2011-09-16 NOTE — Progress Notes (Signed)
HPI Mr. Plancarte returns today for followup of his aortic aneurysm repair and aortic valve replacement. He is doing remarkably well without any symptoms of angina. He has no orthopnea, PND or edema. He's very compliant with his Coumadin and has it checked here in the office. He has full dentures.  Past Medical History  Diagnosis Date  . Unspecified essential hypertension   . Calculus of kidney   . Dizziness   . Paraspinal muscle spasm     Current Outpatient Prescriptions  Medication Sig Dispense Refill  . metoprolol succinate (TOPROL-XL) 25 MG 24 hr tablet TAKE ONE TABLET BY MOUTH ONCE DAILY.  30 tablet  6  . warfarin (COUMADIN) 10 MG tablet Take 1 tablet (10 mg total) by mouth as directed.  30 tablet  3    Allergies  Allergen Reactions  . Codeine     Family History  Problem Relation Age of Onset  . Leukemia Brother   . Heart attack Brother   . Lung cancer Brother   . Colon cancer Brother   . Leukemia Sister     History   Social History  . Marital Status: Married    Spouse Name: N/A    Number of Children: N/A  . Years of Education: N/A   Occupational History  . Not on file.   Social History Main Topics  . Smoking status: Former Games developer  . Smokeless tobacco: Not on file   Comment: quit 61yrs ago  . Alcohol Use: No  . Drug Use: No  . Sexually Active: Not on file   Other Topics Concern  . Not on file   Social History Narrative  . No narrative on file    ROS ALL NEGATIVE EXCEPT THOSE NOTED IN HPI  PE  General Appearance: well developed, well nourished in no acute distress HEENT: symmetrical face, PERRLA, good dentition  Neck: no JVD, thyromegaly, or adenopathy, trachea midline Chest: symmetric without deformity Cardiac: PMI non-displaced, RRR, normal S1, crisp S2, no gallop or murmur Lung: clear to ausculation and percussion Vascular: all pulses full without bruits  Abdominal: nondistended, nontender, good bowel sounds, no HSM, no bruits Extremities: no  cyanosis, clubbing or edema, no sign of DVT, no varicosities  Skin: normal color, no rashes Neuro: alert and oriented x 3, non-focal Pysch: normal affect  EKG  BMET    Component Value Date/Time   NA 140 10/29/2008 1123   K 4.3 10/29/2008 1123   CL 107 10/29/2008 1123   CO2 27 10/29/2008 1123   GLUCOSE 83 10/29/2008 1123   BUN 14 10/29/2008 1123   CREATININE 1.12 10/29/2008 1123   CALCIUM 9.6 10/29/2008 1123   GFRNONAA >60 10/29/2008 1123   GFRAA  Value: >60        The eGFR has been calculated using the MDRD equation. This calculation has not been validated in all clinical situations. eGFR's persistently <60 mL/min signify possible Chronic Kidney Disease. 10/29/2008 1123    Lipid Panel  No results found for this basename: chol, trig, hdl, cholhdl, vldl, ldlcalc    CBC    Component Value Date/Time   WBC 10.1 07/29/2008 0334   RBC 3.31* 07/29/2008 0334   HGB 14.1 10/29/2008 1123   HCT 41.1 10/29/2008 1123   PLT 137* 07/29/2008 0334   MCV 87.8 07/29/2008 0334   MCHC 34.6 07/29/2008 0334   RDW 15.2 07/29/2008 0334   LYMPHSABS 1.0 04/20/2008 0110   MONOABS 1.2* 04/20/2008 0110   EOSABS 0.0 04/20/2008 0110   BASOSABS  0.0 04/20/2008 0110      

## 2011-09-16 NOTE — Assessment & Plan Note (Signed)
He is status post a Bental procedure. He is doing well. I do not hear any aortic insufficiency. Followup in one year.

## 2011-09-16 NOTE — Assessment & Plan Note (Signed)
He is doing well by history and exam. No change in recommendations. No clinical indication for echocardiography. Return the office in one year.

## 2011-09-16 NOTE — Patient Instructions (Addendum)
Your physician recommends that you schedule a follow-up appointment in: 1 year with Dr. Daleen Squibb 1 - 1 month with Misty Stanley for INR 8/26 at 1040 AM

## 2011-10-21 ENCOUNTER — Ambulatory Visit (INDEPENDENT_AMBULATORY_CARE_PROVIDER_SITE_OTHER): Payer: BC Managed Care – PPO | Admitting: *Deleted

## 2011-10-21 DIAGNOSIS — Z954 Presence of other heart-valve replacement: Secondary | ICD-10-CM

## 2011-10-21 DIAGNOSIS — Z7901 Long term (current) use of anticoagulants: Secondary | ICD-10-CM

## 2011-10-21 DIAGNOSIS — I359 Nonrheumatic aortic valve disorder, unspecified: Secondary | ICD-10-CM

## 2011-11-07 ENCOUNTER — Other Ambulatory Visit: Payer: Self-pay | Admitting: Adult Health

## 2011-12-09 ENCOUNTER — Ambulatory Visit (INDEPENDENT_AMBULATORY_CARE_PROVIDER_SITE_OTHER): Payer: BC Managed Care – PPO | Admitting: *Deleted

## 2011-12-09 DIAGNOSIS — Z7901 Long term (current) use of anticoagulants: Secondary | ICD-10-CM

## 2011-12-09 DIAGNOSIS — Z954 Presence of other heart-valve replacement: Secondary | ICD-10-CM

## 2011-12-09 DIAGNOSIS — I359 Nonrheumatic aortic valve disorder, unspecified: Secondary | ICD-10-CM

## 2012-01-18 ENCOUNTER — Ambulatory Visit (INDEPENDENT_AMBULATORY_CARE_PROVIDER_SITE_OTHER): Payer: BC Managed Care – PPO | Admitting: *Deleted

## 2012-01-18 DIAGNOSIS — Z7901 Long term (current) use of anticoagulants: Secondary | ICD-10-CM

## 2012-01-18 DIAGNOSIS — I359 Nonrheumatic aortic valve disorder, unspecified: Secondary | ICD-10-CM

## 2012-01-18 DIAGNOSIS — Z954 Presence of other heart-valve replacement: Secondary | ICD-10-CM

## 2012-02-13 ENCOUNTER — Ambulatory Visit (INDEPENDENT_AMBULATORY_CARE_PROVIDER_SITE_OTHER): Payer: BC Managed Care – PPO | Admitting: *Deleted

## 2012-02-13 DIAGNOSIS — I359 Nonrheumatic aortic valve disorder, unspecified: Secondary | ICD-10-CM

## 2012-02-13 DIAGNOSIS — Z954 Presence of other heart-valve replacement: Secondary | ICD-10-CM

## 2012-02-13 DIAGNOSIS — Z7901 Long term (current) use of anticoagulants: Secondary | ICD-10-CM

## 2012-02-13 MED ORDER — ENOXAPARIN SODIUM 120 MG/0.8ML ~~LOC~~ SOLN: 120.0000 mg | Freq: Every day | SUBCUTANEOUS | Status: DC

## 2012-02-17 ENCOUNTER — Ambulatory Visit (INDEPENDENT_AMBULATORY_CARE_PROVIDER_SITE_OTHER): Payer: BC Managed Care – PPO | Admitting: *Deleted

## 2012-02-17 DIAGNOSIS — I359 Nonrheumatic aortic valve disorder, unspecified: Secondary | ICD-10-CM

## 2012-02-17 DIAGNOSIS — Z7901 Long term (current) use of anticoagulants: Secondary | ICD-10-CM

## 2012-02-17 DIAGNOSIS — Z954 Presence of other heart-valve replacement: Secondary | ICD-10-CM

## 2012-03-14 ENCOUNTER — Ambulatory Visit (INDEPENDENT_AMBULATORY_CARE_PROVIDER_SITE_OTHER): Payer: BC Managed Care – PPO | Admitting: *Deleted

## 2012-03-14 DIAGNOSIS — Z954 Presence of other heart-valve replacement: Secondary | ICD-10-CM

## 2012-03-14 DIAGNOSIS — I359 Nonrheumatic aortic valve disorder, unspecified: Secondary | ICD-10-CM

## 2012-03-14 DIAGNOSIS — Z7901 Long term (current) use of anticoagulants: Secondary | ICD-10-CM

## 2012-03-14 LAB — POCT INR: INR: 2.5

## 2012-03-14 NOTE — Progress Notes (Signed)
This encounter was created in error - please disregard.

## 2012-04-10 ENCOUNTER — Other Ambulatory Visit: Payer: Self-pay | Admitting: Cardiology

## 2012-04-10 MED ORDER — WARFARIN SODIUM 10 MG PO TABS: 10.0000 mg | ORAL_TABLET | ORAL | Status: DC

## 2012-04-13 ENCOUNTER — Ambulatory Visit (INDEPENDENT_AMBULATORY_CARE_PROVIDER_SITE_OTHER): Payer: BC Managed Care – PPO | Admitting: *Deleted

## 2012-04-13 DIAGNOSIS — I359 Nonrheumatic aortic valve disorder, unspecified: Secondary | ICD-10-CM

## 2012-04-13 DIAGNOSIS — Z7901 Long term (current) use of anticoagulants: Secondary | ICD-10-CM

## 2012-04-13 DIAGNOSIS — Z954 Presence of other heart-valve replacement: Secondary | ICD-10-CM

## 2012-04-13 LAB — POCT INR: INR: 3.2

## 2012-05-18 ENCOUNTER — Ambulatory Visit (INDEPENDENT_AMBULATORY_CARE_PROVIDER_SITE_OTHER): Payer: BC Managed Care – PPO | Admitting: *Deleted

## 2012-05-18 DIAGNOSIS — Z7901 Long term (current) use of anticoagulants: Secondary | ICD-10-CM

## 2012-05-18 DIAGNOSIS — Z954 Presence of other heart-valve replacement: Secondary | ICD-10-CM

## 2012-05-18 DIAGNOSIS — I359 Nonrheumatic aortic valve disorder, unspecified: Secondary | ICD-10-CM

## 2012-05-18 LAB — POCT INR: INR: 2.9

## 2012-06-06 ENCOUNTER — Other Ambulatory Visit: Payer: Self-pay | Admitting: Emergency Medicine

## 2012-06-06 MED ORDER — METOPROLOL SUCCINATE ER 25 MG PO TB24: ORAL_TABLET | ORAL | Status: DC

## 2012-06-29 ENCOUNTER — Ambulatory Visit (INDEPENDENT_AMBULATORY_CARE_PROVIDER_SITE_OTHER): Payer: BC Managed Care – PPO | Admitting: *Deleted

## 2012-06-29 DIAGNOSIS — Z954 Presence of other heart-valve replacement: Secondary | ICD-10-CM

## 2012-06-29 DIAGNOSIS — I359 Nonrheumatic aortic valve disorder, unspecified: Secondary | ICD-10-CM

## 2012-06-29 DIAGNOSIS — Z7901 Long term (current) use of anticoagulants: Secondary | ICD-10-CM

## 2012-06-29 LAB — POCT INR: INR: 3.5

## 2012-07-09 ENCOUNTER — Ambulatory Visit (INDEPENDENT_AMBULATORY_CARE_PROVIDER_SITE_OTHER): Payer: BC Managed Care – PPO | Admitting: *Deleted

## 2012-07-09 ENCOUNTER — Encounter (HOSPITAL_COMMUNITY): Payer: Self-pay | Admitting: *Deleted

## 2012-07-09 ENCOUNTER — Emergency Department (HOSPITAL_COMMUNITY)
Admission: EM | Admit: 2012-07-09 | Discharge: 2012-07-09 | Disposition: A | Discharge status: Home / Self Care | Payer: Worker's Compensation | Attending: Emergency Medicine | Admitting: Emergency Medicine

## 2012-07-09 DIAGNOSIS — Z87442 Personal history of urinary calculi: Secondary | ICD-10-CM | POA: Insufficient documentation

## 2012-07-09 DIAGNOSIS — Z79899 Other long term (current) drug therapy: Secondary | ICD-10-CM | POA: Insufficient documentation

## 2012-07-09 DIAGNOSIS — I359 Nonrheumatic aortic valve disorder, unspecified: Secondary | ICD-10-CM

## 2012-07-09 DIAGNOSIS — Z7901 Long term (current) use of anticoagulants: Secondary | ICD-10-CM

## 2012-07-09 DIAGNOSIS — Z8669 Personal history of other diseases of the nervous system and sense organs: Secondary | ICD-10-CM | POA: Insufficient documentation

## 2012-07-09 DIAGNOSIS — I10 Essential (primary) hypertension: Secondary | ICD-10-CM | POA: Insufficient documentation

## 2012-07-09 DIAGNOSIS — Z8739 Personal history of other diseases of the musculoskeletal system and connective tissue: Secondary | ICD-10-CM | POA: Insufficient documentation

## 2012-07-09 DIAGNOSIS — Y939 Activity, unspecified: Secondary | ICD-10-CM | POA: Insufficient documentation

## 2012-07-09 DIAGNOSIS — Z954 Presence of other heart-valve replacement: Secondary | ICD-10-CM

## 2012-07-09 DIAGNOSIS — Y929 Unspecified place or not applicable: Secondary | ICD-10-CM | POA: Insufficient documentation

## 2012-07-09 DIAGNOSIS — R42 Dizziness and giddiness: Secondary | ICD-10-CM | POA: Insufficient documentation

## 2012-07-09 DIAGNOSIS — L089 Local infection of the skin and subcutaneous tissue, unspecified: Secondary | ICD-10-CM

## 2012-07-09 DIAGNOSIS — Z87891 Personal history of nicotine dependence: Secondary | ICD-10-CM | POA: Insufficient documentation

## 2012-07-09 DIAGNOSIS — R011 Cardiac murmur, unspecified: Secondary | ICD-10-CM | POA: Insufficient documentation

## 2012-07-09 DIAGNOSIS — M549 Dorsalgia, unspecified: Secondary | ICD-10-CM | POA: Insufficient documentation

## 2012-07-09 LAB — POCT INR: INR: 2.1

## 2012-07-09 NOTE — ED Notes (Signed)
Swelling  , redness RMF. prox phalanx

## 2012-07-09 NOTE — ED Notes (Signed)
Pt alert & oriented x4, stable gait. Patient given discharge instructions, paperwork & prescription(s). Patient  instructed to stop at the registration desk to finish any additional paperwork. Patient verbalized understanding. Pt left department w/ no further questions. 

## 2012-07-09 NOTE — ED Provider Notes (Signed)
History     CSN: [id number]  Arrival date & time 07/09/12  1742   First MD Initiated Contact with Patient 07/09/12 1952      Chief Complaint  Patient presents with  . Hand Pain    (Consider location/radiation/quality/duration/timing/severity/associated sxs/prior treatment) HPI Comments: Pt states he sustained an insect bite to the right middle finger 1 week ago. He was seen by his PCP and treated with clindamycin. He continues to have an area of increase redness present and is concerned the antibiotic is not working. No fever or chills reported. Some drainage from the red area today.   The history is provided by the patient.    Past Medical History  Diagnosis Date  . Unspecified essential hypertension   . Dizziness   . Paraspinal muscle spasm   . Calculus of kidney     Past Surgical History  Procedure Laterality Date  . Right shoulder surgery    . Back surgery  2009  . Colonoscopy    . Cardiac valve replacement    . Eye surgery      Family History  Problem Relation Age of Onset  . Leukemia Brother   . Heart attack Brother   . Lung cancer Brother   . Colon cancer Brother   . Leukemia Sister     History  Substance Use Topics  . Smoking status: Former Games developer  . Smokeless tobacco: Not on file     Comment: quit 41yrs ago  . Alcohol Use: No      Review of Systems  Constitutional: Negative for activity change.       All ROS Neg except as noted in HPI  HENT: Negative for nosebleeds and neck pain.   Eyes: Negative for photophobia and discharge.  Respiratory: Negative for cough, shortness of breath and wheezing.   Cardiovascular: Negative for chest pain and palpitations.  Gastrointestinal: Negative for abdominal pain and blood in stool.  Genitourinary: Negative for dysuria, frequency and hematuria.  Musculoskeletal: Positive for back pain and arthralgias.  Skin: Negative.   Neurological: Positive for dizziness. Negative for seizures and speech difficulty.   Psychiatric/Behavioral: Negative for hallucinations and confusion.    Allergies  Codeine  Home Medications   Current Outpatient Rx  Name  Route  Sig  Dispense  Refill  . amLODipine (NORVASC) 5 MG tablet   Oral   Take 5 mg by mouth daily.         . clindamycin (CLEOCIN) 300 MG capsule   Oral   Take 300 mg by mouth every 8 (eight) hours. For 10 days         . metoprolol succinate (TOPROL-XL) 25 MG 24 hr tablet   Oral   Take 25 mg by mouth.         . warfarin (COUMADIN) 10 MG tablet   Oral   Take 5-10 mg by mouth as directed. Take one tablet on Mondays, then take one-half tablet on all other days           BP 155/83  Pulse 82  Temp(Src) 97 F (36.1 C) (Oral)  Resp 18  Ht 5\' 8"  (1.727 m)  Wt 180 lb (81.647 kg)  BMI 27.38 kg/m2  SpO2 98%  Physical Exam  Nursing note and vitals reviewed. Constitutional: He is oriented to person, place, and time. He appears well-developed and well-nourished.  Non-toxic appearance.  HENT:  Head: Normocephalic.  Right Ear: Tympanic membrane and external ear normal.  Left Ear: Tympanic membrane and external  ear normal.  Eyes: EOM and lids are normal. Pupils are equal, round, and reactive to light.  Neck: Normal range of motion. Neck supple. Carotid bruit is not present.  Cardiovascular: Normal rate, regular rhythm, intact distal pulses and normal pulses.   Murmur heard. Pulmonary/Chest: Breath sounds normal. No respiratory distress.  Abdominal: Soft. Bowel sounds are normal. There is no tenderness. There is no guarding.  Musculoskeletal: Normal range of motion.  There is full range of motion of the right elbow and wrist. There is no swelling of the dorsum of the right hand. There is increased redness and swelling of the inner aspect of the third finger of the right hand adjacent to the MP joint. There is good capillary refill of all fingers of the right hand. There is no red streaking noted. The radial pulse is 2+. Is no palpable  nodes of the biceps triceps area or the axilla.  Lymphadenopathy:       Head (right side): No submandibular adenopathy present.       Head (left side): No submandibular adenopathy present.    He has no cervical adenopathy.  Neurological: He is alert and oriented to person, place, and time. He has normal strength. No cranial nerve deficit or sensory deficit.  Skin: Skin is warm and dry.  Psychiatric: He has a normal mood and affect. His speech is normal.    ED Course  Procedures (including critical care time)  Labs Reviewed - No data to display No results found.   1. Infected insect bite of third finger of right hand, subsequent encounter       MDM  I have reviewed nursing notes, vital signs, and all appropriate lab and imaging results for this patient. Pt seen with me by Dr Estell Harpin. Pt now gives hx that the middle finger and the hand were swollen and the hand has improved. He was seen by a dermatologist today and it was suggested that he go to the ED immediately for evalution of the middle finger. Case discussed with Dr Mina Marble. He will see pt in the office tomorrow. Pt ask to continue the clindamycin.       Kathie Dike, PA-C 07/10/12 0119

## 2012-07-09 NOTE — ED Notes (Signed)
Pt right middle finger noted red & swollen. Pt being tx w/ antibiotics. Was seen at skin Dr & was advised he needed to be rechecked.

## 2012-07-10 NOTE — ED Provider Notes (Signed)
Medical screening examination/treatment/procedure(s) were performed by non-physician practitioner and as supervising physician I was immediately available for consultation/collaboration.   Najeeb Uptain L Rashema Seawright, MD 07/10/12 1509 

## 2012-07-13 ENCOUNTER — Ambulatory Visit (INDEPENDENT_AMBULATORY_CARE_PROVIDER_SITE_OTHER): Payer: BC Managed Care – PPO | Admitting: *Deleted

## 2012-07-13 DIAGNOSIS — Z7901 Long term (current) use of anticoagulants: Secondary | ICD-10-CM

## 2012-07-13 DIAGNOSIS — Z954 Presence of other heart-valve replacement: Secondary | ICD-10-CM

## 2012-07-13 DIAGNOSIS — I359 Nonrheumatic aortic valve disorder, unspecified: Secondary | ICD-10-CM

## 2012-07-13 LAB — POCT INR: INR: 2.5

## 2012-08-03 ENCOUNTER — Other Ambulatory Visit (HOSPITAL_COMMUNITY): Payer: Self-pay | Admitting: Internal Medicine

## 2012-08-03 ENCOUNTER — Ambulatory Visit (HOSPITAL_COMMUNITY)
Admission: RE | Admit: 2012-08-03 | Discharge: 2012-08-03 | Disposition: A | Discharge status: Home / Self Care | Payer: BC Managed Care – PPO | Source: Ambulatory Visit | Attending: Internal Medicine | Admitting: Internal Medicine

## 2012-08-03 ENCOUNTER — Ambulatory Visit (INDEPENDENT_AMBULATORY_CARE_PROVIDER_SITE_OTHER): Payer: BC Managed Care – PPO | Admitting: *Deleted

## 2012-08-03 DIAGNOSIS — Z954 Presence of other heart-valve replacement: Secondary | ICD-10-CM

## 2012-08-03 DIAGNOSIS — R519 Headache, unspecified: Secondary | ICD-10-CM

## 2012-08-03 DIAGNOSIS — R51 Headache: Secondary | ICD-10-CM | POA: Insufficient documentation

## 2012-08-03 DIAGNOSIS — Z7901 Long term (current) use of anticoagulants: Secondary | ICD-10-CM

## 2012-08-03 DIAGNOSIS — I359 Nonrheumatic aortic valve disorder, unspecified: Secondary | ICD-10-CM

## 2012-08-31 ENCOUNTER — Ambulatory Visit (INDEPENDENT_AMBULATORY_CARE_PROVIDER_SITE_OTHER): Payer: BC Managed Care – PPO | Admitting: *Deleted

## 2012-08-31 DIAGNOSIS — I359 Nonrheumatic aortic valve disorder, unspecified: Secondary | ICD-10-CM

## 2012-08-31 DIAGNOSIS — Z7901 Long term (current) use of anticoagulants: Secondary | ICD-10-CM

## 2012-08-31 DIAGNOSIS — Z954 Presence of other heart-valve replacement: Secondary | ICD-10-CM

## 2012-09-26 ENCOUNTER — Other Ambulatory Visit: Payer: Self-pay

## 2012-10-05 ENCOUNTER — Ambulatory Visit (INDEPENDENT_AMBULATORY_CARE_PROVIDER_SITE_OTHER): Payer: BC Managed Care – PPO | Admitting: *Deleted

## 2012-10-05 ENCOUNTER — Ambulatory Visit (INDEPENDENT_AMBULATORY_CARE_PROVIDER_SITE_OTHER): Payer: BC Managed Care – PPO | Admitting: Cardiology

## 2012-10-05 ENCOUNTER — Encounter: Payer: Self-pay | Admitting: Cardiology

## 2012-10-05 VITALS — BP 132/82 | HR 50 | Ht 68.0 in | Wt 181.0 lb

## 2012-10-05 DIAGNOSIS — Z954 Presence of other heart-valve replacement: Secondary | ICD-10-CM

## 2012-10-05 DIAGNOSIS — Z7901 Long term (current) use of anticoagulants: Secondary | ICD-10-CM

## 2012-10-05 DIAGNOSIS — I10 Essential (primary) hypertension: Secondary | ICD-10-CM

## 2012-10-05 DIAGNOSIS — I359 Nonrheumatic aortic valve disorder, unspecified: Secondary | ICD-10-CM

## 2012-10-05 DIAGNOSIS — I712 Thoracic aortic aneurysm, without rupture: Secondary | ICD-10-CM

## 2012-10-05 NOTE — Assessment & Plan Note (Signed)
Stable status post repair. Exam is stable. No indication for echocardiography.

## 2012-10-05 NOTE — Patient Instructions (Signed)
Your physician recommends that you schedule a follow-up appointment in: 1 year You will receive a reminder letter two months in advance reminding you to call and schedule your appointment. If you don't receive this letter, please contact our office.  Your physician recommends that you continue on your current medications as directed. Please refer to the Current Medication list given to you today.   

## 2012-10-05 NOTE — Progress Notes (Signed)
HPI Stephen Moses returns today for evaluation and management of his history of aortic valve replacement and aneurysm repair. He's doing remarkably well and has no complaints. He continues to be very active. He denies any bleeding or melena. He denies any dizziness, fatigue, orthostatic symptoms. Heart rate runs in the 50s on low-dose Lopressor.  Past Medical History  Diagnosis Date  . Unspecified essential hypertension   . Dizziness   . Paraspinal muscle spasm   . Calculus of kidney     Current Outpatient Prescriptions  Medication Sig Dispense Refill  . amLODipine (NORVASC) 5 MG tablet Take 5 mg by mouth daily.      [name] MAGNESIUM PO Take by mouth daily. OTC      . metoprolol succinate (TOPROL-XL) 25 MG 24 hr tablet Take 25 mg by mouth.      . warfarin (COUMADIN) 10 MG tablet Take 5-10 mg by mouth as directed. Take one tablet on Mondays, then take one-half tablet on all other days       No current facility-administered medications for this visit.    Allergies  Allergen Reactions  . Codeine Nausea And Vomiting and Palpitations    Family History  Problem Relation Age of Onset  . Leukemia Brother   . Heart attack Brother   . Lung cancer Brother   . Colon cancer Brother   . Leukemia Sister     History   Social History  . Marital Status: Married    Spouse Name: N/A    Number of Children: N/A  . Years of Education: N/A   Occupational History  . Not on file.   Social History Main Topics  . Smoking status: Former Games developer  . Smokeless tobacco: Not on file     Comment: quit 72yrs ago  . Alcohol Use: No  . Drug Use: No  . Sexual Activity: Not on file   Other Topics Concern  . Not on file   Social History Narrative  . No narrative on file    ROS ALL NEGATIVE EXCEPT THOSE NOTED IN HPI  PE  General Appearance: well developed, well nourished in no acute distress HEENT: symmetrical face, PERRLA, good dentition  Neck: no JVD, thyromegaly, or adenopathy, trachea  midline Chest: symmetric without deformity Cardiac: PMI non-displaced, RRR, normal S1, prosthetic S2 radiating into the neck. Soft systolic murmur S2 splits no diastolic component, no gallop  Lung: clear to ausculation and percussion Vascular: all pulses full without bruits  Abdominal: nondistended, nontender, good bowel sounds, no HSM, no bruits Extremities: no cyanosis, clubbing or edema, no sign of DVT, no varicosities  Skin: normal color, no rashes Neuro: alert and oriented x 3, non-focal Pysch: normal affect  EKG Sinus bradycardia, otherwise normal. BMET    Component Value Date/Time   NA 140 10/29/2008 1123   K 4.3 10/29/2008 1123   CL 107 10/29/2008 1123   CO2 27 10/29/2008 1123   GLUCOSE 83 10/29/2008 1123   BUN 14 10/29/2008 1123   CREATININE 1.12 10/29/2008 1123   CALCIUM 9.6 10/29/2008 1123   GFRNONAA >60 10/29/2008 1123   GFRAA  Value: >60        The eGFR has been calculated using the MDRD equation. This calculation has not been validated in all clinical situations. eGFR's persistently <60 mL/min signify possible Chronic Kidney Disease. 10/29/2008 1123    Lipid Panel  No results found for this basename: chol, trig, hdl, cholhdl, vldl, ldlcalc    CBC    Component Value Date/Time  WBC 10.1 07/29/2008 0334   RBC 3.31* 07/29/2008 0334   HGB 14.1 10/29/2008 1123   HCT 41.1 10/29/2008 1123   PLT 137* 07/29/2008 0334   MCV 87.8 07/29/2008 0334   MCHC 34.6 07/29/2008 0334   RDW 15.2 07/29/2008 0334   LYMPHSABS 1.0 04/20/2008 0110   MONOABS 1.2* 04/20/2008 0110   EOSABS 0.0 04/20/2008 0110   BASOSABS 0.0 04/20/2008 0110

## 2012-10-05 NOTE — Assessment & Plan Note (Signed)
Totally asymptomatic with his bradycardia. Blood pressure in good control. I've advised that if he has any lightheadedness, fatigue, or dizziness to contact us and we will stop his metoprolol. I'll leave unchanged today. He has a history of palpitations and they are well controlled.

## 2012-10-05 NOTE — Assessment & Plan Note (Signed)
Doing well status post replacement. No change in current medical program. We'll see back in the office in one year.

## 2012-11-09 ENCOUNTER — Ambulatory Visit (INDEPENDENT_AMBULATORY_CARE_PROVIDER_SITE_OTHER): Payer: BC Managed Care – PPO | Admitting: *Deleted

## 2012-11-09 DIAGNOSIS — Z954 Presence of other heart-valve replacement: Secondary | ICD-10-CM

## 2012-11-09 DIAGNOSIS — Z7901 Long term (current) use of anticoagulants: Secondary | ICD-10-CM

## 2012-11-09 DIAGNOSIS — I359 Nonrheumatic aortic valve disorder, unspecified: Secondary | ICD-10-CM

## 2012-11-09 LAB — POCT INR: INR: 2.8

## 2012-11-15 ENCOUNTER — Telehealth: Payer: Self-pay | Admitting: *Deleted

## 2012-11-15 ENCOUNTER — Ambulatory Visit: Payer: BC Managed Care – PPO | Admitting: *Deleted

## 2012-11-15 DIAGNOSIS — I359 Nonrheumatic aortic valve disorder, unspecified: Secondary | ICD-10-CM

## 2012-11-15 DIAGNOSIS — Z7901 Long term (current) use of anticoagulants: Secondary | ICD-10-CM

## 2012-11-15 DIAGNOSIS — Z954 Presence of other heart-valve replacement: Secondary | ICD-10-CM

## 2012-11-15 LAB — POCT INR: INR: 3.3

## 2012-11-15 NOTE — Telephone Encounter (Signed)
INR 3.3 PT 32.6. PT WAS TOLD TO CALL RESULT TO GET NEXT SCHEDULE

## 2012-11-15 NOTE — Telephone Encounter (Signed)
See coumadin note. 

## 2012-11-23 ENCOUNTER — Ambulatory Visit (INDEPENDENT_AMBULATORY_CARE_PROVIDER_SITE_OTHER): Payer: BC Managed Care – PPO | Admitting: *Deleted

## 2012-11-23 DIAGNOSIS — I359 Nonrheumatic aortic valve disorder, unspecified: Secondary | ICD-10-CM

## 2012-11-23 DIAGNOSIS — Z7901 Long term (current) use of anticoagulants: Secondary | ICD-10-CM

## 2012-11-23 DIAGNOSIS — Z954 Presence of other heart-valve replacement: Secondary | ICD-10-CM

## 2012-12-21 ENCOUNTER — Ambulatory Visit (INDEPENDENT_AMBULATORY_CARE_PROVIDER_SITE_OTHER): Payer: BC Managed Care – PPO | Admitting: *Deleted

## 2012-12-21 DIAGNOSIS — Z7901 Long term (current) use of anticoagulants: Secondary | ICD-10-CM

## 2012-12-21 DIAGNOSIS — Z5181 Encounter for therapeutic drug level monitoring: Secondary | ICD-10-CM

## 2012-12-21 DIAGNOSIS — I359 Nonrheumatic aortic valve disorder, unspecified: Secondary | ICD-10-CM

## 2012-12-21 DIAGNOSIS — Z954 Presence of other heart-valve replacement: Secondary | ICD-10-CM

## 2012-12-21 LAB — POCT INR: INR: 3.5

## 2012-12-27 ENCOUNTER — Other Ambulatory Visit: Payer: Self-pay

## 2013-01-08 ENCOUNTER — Telehealth: Payer: Self-pay | Admitting: Cardiology

## 2013-01-08 MED ORDER — WARFARIN SODIUM 10 MG PO TABS: 5.0000 mg | ORAL_TABLET | ORAL | Status: DC

## 2013-01-08 NOTE — Telephone Encounter (Signed)
Received fax refill request  Rx # [id number] Medication:  Warfarin Sodium 10 mg tab Qty 30 Sig:  Take one tablet daily as directed Physician:  Daleen Squibb

## 2013-01-25 ENCOUNTER — Ambulatory Visit (INDEPENDENT_AMBULATORY_CARE_PROVIDER_SITE_OTHER): Payer: BC Managed Care – PPO | Admitting: *Deleted

## 2013-01-25 DIAGNOSIS — Z954 Presence of other heart-valve replacement: Secondary | ICD-10-CM

## 2013-01-25 DIAGNOSIS — Z5181 Encounter for therapeutic drug level monitoring: Secondary | ICD-10-CM

## 2013-01-25 DIAGNOSIS — Z7901 Long term (current) use of anticoagulants: Secondary | ICD-10-CM

## 2013-01-25 DIAGNOSIS — I359 Nonrheumatic aortic valve disorder, unspecified: Secondary | ICD-10-CM

## 2013-01-25 LAB — POCT INR: INR: 2.8

## 2013-03-08 ENCOUNTER — Ambulatory Visit (INDEPENDENT_AMBULATORY_CARE_PROVIDER_SITE_OTHER): Payer: BC Managed Care – PPO | Admitting: *Deleted

## 2013-03-08 DIAGNOSIS — I359 Nonrheumatic aortic valve disorder, unspecified: Secondary | ICD-10-CM

## 2013-03-08 DIAGNOSIS — Z954 Presence of other heart-valve replacement: Secondary | ICD-10-CM

## 2013-03-08 DIAGNOSIS — Z7901 Long term (current) use of anticoagulants: Secondary | ICD-10-CM

## 2013-03-08 LAB — POCT INR: INR: 4.2

## 2013-03-28 ENCOUNTER — Ambulatory Visit (INDEPENDENT_AMBULATORY_CARE_PROVIDER_SITE_OTHER): Payer: BC Managed Care – PPO | Admitting: *Deleted

## 2013-03-28 DIAGNOSIS — Z5181 Encounter for therapeutic drug level monitoring: Secondary | ICD-10-CM | POA: Insufficient documentation

## 2013-03-28 DIAGNOSIS — Z7901 Long term (current) use of anticoagulants: Secondary | ICD-10-CM

## 2013-03-28 DIAGNOSIS — Z954 Presence of other heart-valve replacement: Secondary | ICD-10-CM

## 2013-03-28 DIAGNOSIS — I359 Nonrheumatic aortic valve disorder, unspecified: Secondary | ICD-10-CM

## 2013-03-28 LAB — POCT INR: INR: 3.6

## 2013-04-05 ENCOUNTER — Ambulatory Visit (INDEPENDENT_AMBULATORY_CARE_PROVIDER_SITE_OTHER): Payer: BC Managed Care – PPO | Admitting: *Deleted

## 2013-04-05 DIAGNOSIS — Z7901 Long term (current) use of anticoagulants: Secondary | ICD-10-CM

## 2013-04-05 DIAGNOSIS — Z5181 Encounter for therapeutic drug level monitoring: Secondary | ICD-10-CM

## 2013-04-05 DIAGNOSIS — I359 Nonrheumatic aortic valve disorder, unspecified: Secondary | ICD-10-CM

## 2013-04-05 DIAGNOSIS — Z954 Presence of other heart-valve replacement: Secondary | ICD-10-CM

## 2013-04-05 LAB — POCT INR: INR: 4

## 2013-04-12 ENCOUNTER — Ambulatory Visit (INDEPENDENT_AMBULATORY_CARE_PROVIDER_SITE_OTHER): Payer: BC Managed Care – PPO | Admitting: *Deleted

## 2013-04-12 DIAGNOSIS — Z5181 Encounter for therapeutic drug level monitoring: Secondary | ICD-10-CM

## 2013-04-12 DIAGNOSIS — Z7901 Long term (current) use of anticoagulants: Secondary | ICD-10-CM

## 2013-04-12 DIAGNOSIS — I359 Nonrheumatic aortic valve disorder, unspecified: Secondary | ICD-10-CM

## 2013-04-12 DIAGNOSIS — Z954 Presence of other heart-valve replacement: Secondary | ICD-10-CM

## 2013-04-12 LAB — POCT INR
INR: 3.5
INR: 3.5

## 2013-05-03 ENCOUNTER — Ambulatory Visit (INDEPENDENT_AMBULATORY_CARE_PROVIDER_SITE_OTHER): Payer: BC Managed Care – PPO | Admitting: *Deleted

## 2013-05-03 DIAGNOSIS — Z5181 Encounter for therapeutic drug level monitoring: Secondary | ICD-10-CM

## 2013-05-03 DIAGNOSIS — I359 Nonrheumatic aortic valve disorder, unspecified: Secondary | ICD-10-CM

## 2013-05-03 DIAGNOSIS — Z954 Presence of other heart-valve replacement: Secondary | ICD-10-CM

## 2013-05-03 DIAGNOSIS — Z7901 Long term (current) use of anticoagulants: Secondary | ICD-10-CM

## 2013-05-03 LAB — POCT INR: INR: 4

## 2013-05-17 ENCOUNTER — Ambulatory Visit (INDEPENDENT_AMBULATORY_CARE_PROVIDER_SITE_OTHER): Payer: BC Managed Care – PPO | Admitting: *Deleted

## 2013-05-17 DIAGNOSIS — Z7901 Long term (current) use of anticoagulants: Secondary | ICD-10-CM

## 2013-05-17 DIAGNOSIS — Z5181 Encounter for therapeutic drug level monitoring: Secondary | ICD-10-CM

## 2013-05-17 DIAGNOSIS — I359 Nonrheumatic aortic valve disorder, unspecified: Secondary | ICD-10-CM

## 2013-05-17 DIAGNOSIS — Z954 Presence of other heart-valve replacement: Secondary | ICD-10-CM

## 2013-05-17 LAB — POCT INR: INR: 2.9

## 2013-06-14 ENCOUNTER — Ambulatory Visit (INDEPENDENT_AMBULATORY_CARE_PROVIDER_SITE_OTHER): Payer: BC Managed Care – PPO | Admitting: *Deleted

## 2013-06-14 DIAGNOSIS — Z7901 Long term (current) use of anticoagulants: Secondary | ICD-10-CM

## 2013-06-14 DIAGNOSIS — Z5181 Encounter for therapeutic drug level monitoring: Secondary | ICD-10-CM

## 2013-06-14 DIAGNOSIS — Z954 Presence of other heart-valve replacement: Secondary | ICD-10-CM

## 2013-06-14 DIAGNOSIS — I359 Nonrheumatic aortic valve disorder, unspecified: Secondary | ICD-10-CM

## 2013-06-14 LAB — POCT INR: INR: 5.8

## 2013-06-25 ENCOUNTER — Ambulatory Visit (INDEPENDENT_AMBULATORY_CARE_PROVIDER_SITE_OTHER): Payer: BC Managed Care – PPO | Admitting: *Deleted

## 2013-06-25 DIAGNOSIS — Z7901 Long term (current) use of anticoagulants: Secondary | ICD-10-CM

## 2013-06-25 DIAGNOSIS — Z5181 Encounter for therapeutic drug level monitoring: Secondary | ICD-10-CM

## 2013-06-25 DIAGNOSIS — Z954 Presence of other heart-valve replacement: Secondary | ICD-10-CM

## 2013-06-25 DIAGNOSIS — I359 Nonrheumatic aortic valve disorder, unspecified: Secondary | ICD-10-CM

## 2013-06-25 LAB — POCT INR: INR: 5.9

## 2013-07-05 ENCOUNTER — Ambulatory Visit (INDEPENDENT_AMBULATORY_CARE_PROVIDER_SITE_OTHER): Payer: BC Managed Care – PPO | Admitting: *Deleted

## 2013-07-05 DIAGNOSIS — Z7901 Long term (current) use of anticoagulants: Secondary | ICD-10-CM

## 2013-07-05 DIAGNOSIS — Z5181 Encounter for therapeutic drug level monitoring: Secondary | ICD-10-CM

## 2013-07-05 DIAGNOSIS — I359 Nonrheumatic aortic valve disorder, unspecified: Secondary | ICD-10-CM

## 2013-07-05 DIAGNOSIS — Z954 Presence of other heart-valve replacement: Secondary | ICD-10-CM

## 2013-07-05 LAB — POCT INR: INR: 2.8

## 2013-07-18 ENCOUNTER — Ambulatory Visit (INDEPENDENT_AMBULATORY_CARE_PROVIDER_SITE_OTHER): Payer: BC Managed Care – PPO | Admitting: *Deleted

## 2013-07-18 DIAGNOSIS — Z7901 Long term (current) use of anticoagulants: Secondary | ICD-10-CM

## 2013-07-18 DIAGNOSIS — I359 Nonrheumatic aortic valve disorder, unspecified: Secondary | ICD-10-CM

## 2013-07-18 DIAGNOSIS — Z954 Presence of other heart-valve replacement: Secondary | ICD-10-CM

## 2013-07-18 DIAGNOSIS — Z5181 Encounter for therapeutic drug level monitoring: Secondary | ICD-10-CM

## 2013-07-18 LAB — POCT INR: INR: 2.6

## 2013-08-15 ENCOUNTER — Ambulatory Visit (INDEPENDENT_AMBULATORY_CARE_PROVIDER_SITE_OTHER): Payer: BC Managed Care – PPO | Admitting: *Deleted

## 2013-08-15 DIAGNOSIS — Z954 Presence of other heart-valve replacement: Secondary | ICD-10-CM

## 2013-08-15 DIAGNOSIS — I359 Nonrheumatic aortic valve disorder, unspecified: Secondary | ICD-10-CM

## 2013-08-15 DIAGNOSIS — Z7901 Long term (current) use of anticoagulants: Secondary | ICD-10-CM

## 2013-08-15 DIAGNOSIS — Z5181 Encounter for therapeutic drug level monitoring: Secondary | ICD-10-CM

## 2013-08-15 LAB — POCT INR: INR: 4.3

## 2013-08-29 ENCOUNTER — Ambulatory Visit (INDEPENDENT_AMBULATORY_CARE_PROVIDER_SITE_OTHER): Payer: Medicare Other | Admitting: *Deleted

## 2013-08-29 DIAGNOSIS — Z5181 Encounter for therapeutic drug level monitoring: Secondary | ICD-10-CM

## 2013-08-29 DIAGNOSIS — Z7901 Long term (current) use of anticoagulants: Secondary | ICD-10-CM | POA: Diagnosis not present

## 2013-08-29 DIAGNOSIS — I359 Nonrheumatic aortic valve disorder, unspecified: Secondary | ICD-10-CM

## 2013-08-29 DIAGNOSIS — Z954 Presence of other heart-valve replacement: Secondary | ICD-10-CM | POA: Diagnosis not present

## 2013-08-29 LAB — POCT INR: INR: 3.5

## 2013-09-04 ENCOUNTER — Other Ambulatory Visit: Payer: Self-pay | Admitting: Cardiovascular Disease

## 2013-09-19 ENCOUNTER — Ambulatory Visit (INDEPENDENT_AMBULATORY_CARE_PROVIDER_SITE_OTHER): Payer: Medicare Other | Admitting: *Deleted

## 2013-09-19 DIAGNOSIS — Z5181 Encounter for therapeutic drug level monitoring: Secondary | ICD-10-CM | POA: Diagnosis not present

## 2013-09-19 DIAGNOSIS — Z7901 Long term (current) use of anticoagulants: Secondary | ICD-10-CM | POA: Diagnosis not present

## 2013-09-19 DIAGNOSIS — I359 Nonrheumatic aortic valve disorder, unspecified: Secondary | ICD-10-CM

## 2013-09-19 DIAGNOSIS — Z954 Presence of other heart-valve replacement: Secondary | ICD-10-CM | POA: Diagnosis not present

## 2013-09-19 LAB — POCT INR: INR: 3

## 2013-10-17 ENCOUNTER — Ambulatory Visit (INDEPENDENT_AMBULATORY_CARE_PROVIDER_SITE_OTHER): Payer: Medicare Other | Admitting: *Deleted

## 2013-10-17 DIAGNOSIS — Z7901 Long term (current) use of anticoagulants: Secondary | ICD-10-CM

## 2013-10-17 DIAGNOSIS — Z954 Presence of other heart-valve replacement: Secondary | ICD-10-CM

## 2013-10-17 DIAGNOSIS — I359 Nonrheumatic aortic valve disorder, unspecified: Secondary | ICD-10-CM | POA: Diagnosis not present

## 2013-10-17 DIAGNOSIS — Z5181 Encounter for therapeutic drug level monitoring: Secondary | ICD-10-CM

## 2013-10-17 LAB — POCT INR: INR: 2.5

## 2013-10-23 ENCOUNTER — Encounter: Payer: Self-pay | Admitting: Cardiovascular Disease

## 2013-11-07 DIAGNOSIS — H04129 Dry eye syndrome of unspecified lacrimal gland: Secondary | ICD-10-CM | POA: Diagnosis not present

## 2013-11-07 DIAGNOSIS — H43399 Other vitreous opacities, unspecified eye: Secondary | ICD-10-CM | POA: Diagnosis not present

## 2013-11-07 DIAGNOSIS — H02839 Dermatochalasis of unspecified eye, unspecified eyelid: Secondary | ICD-10-CM | POA: Diagnosis not present

## 2013-11-13 ENCOUNTER — Ambulatory Visit: Payer: Self-pay | Admitting: Cardiovascular Disease

## 2013-11-15 ENCOUNTER — Ambulatory Visit: Payer: Self-pay | Admitting: Cardiovascular Disease

## 2013-11-19 DIAGNOSIS — E876 Hypokalemia: Secondary | ICD-10-CM | POA: Diagnosis not present

## 2013-11-19 DIAGNOSIS — Z23 Encounter for immunization: Secondary | ICD-10-CM | POA: Diagnosis not present

## 2013-11-19 DIAGNOSIS — R319 Hematuria, unspecified: Secondary | ICD-10-CM | POA: Diagnosis not present

## 2013-11-21 ENCOUNTER — Ambulatory Visit (INDEPENDENT_AMBULATORY_CARE_PROVIDER_SITE_OTHER): Payer: Medicare Other | Admitting: Cardiovascular Disease

## 2013-11-21 ENCOUNTER — Encounter: Payer: Self-pay | Admitting: Cardiovascular Disease

## 2013-11-21 ENCOUNTER — Ambulatory Visit (INDEPENDENT_AMBULATORY_CARE_PROVIDER_SITE_OTHER): Payer: Medicare Other | Admitting: *Deleted

## 2013-11-21 VITALS — BP 122/78 | HR 63 | Ht 68.0 in | Wt 197.8 lb

## 2013-11-21 DIAGNOSIS — I10 Essential (primary) hypertension: Secondary | ICD-10-CM | POA: Diagnosis not present

## 2013-11-21 DIAGNOSIS — Z7901 Long term (current) use of anticoagulants: Secondary | ICD-10-CM

## 2013-11-21 DIAGNOSIS — Z5181 Encounter for therapeutic drug level monitoring: Secondary | ICD-10-CM

## 2013-11-21 DIAGNOSIS — I359 Nonrheumatic aortic valve disorder, unspecified: Secondary | ICD-10-CM | POA: Diagnosis not present

## 2013-11-21 DIAGNOSIS — Z954 Presence of other heart-valve replacement: Secondary | ICD-10-CM

## 2013-11-21 DIAGNOSIS — Z713 Dietary counseling and surveillance: Secondary | ICD-10-CM

## 2013-11-21 DIAGNOSIS — Z952 Presence of prosthetic heart valve: Secondary | ICD-10-CM

## 2013-11-21 LAB — POCT INR: INR: 2.8

## 2013-11-21 NOTE — Patient Instructions (Signed)
Continue all current medications. Your physician wants you to follow up in:  1 year.  You will receive a reminder letter in the mail one-two months in advance.  If you don't receive a letter, please call our office to schedule the follow up appointment   

## 2013-11-21 NOTE — Progress Notes (Signed)
Patient ID: Stephen Moses, male   DOB: 12/26/1945, 68 y.o.   MRN: [id number]      SUBJECTIVE: The patient is a 68 year old male who I am evaluating for the first time. He has a history of bicuspid aortic valve and ascending aortic aneurysm for which he underwent aneurysm repair and mechanical aortic valve replacement in June 2010. He also has a history of hypertension. The patient denies any symptoms of chest pain, palpitations, shortness of breath, lightheadedness, dizziness, leg swelling, orthopnea, PND, and syncope. He sometimes has to take a deep breath but walks 1 mile daily without exertional dyspnea. He had some mild right ankle swelling three weeks ago which has since resolved. He exercises every other day and lifts weights. He is a retired Research officer, trade [employer]. He tries to eat healthy but splurges every once in a while on potato chips and Slim Jims.  ECG on 05/17/2013 demonstrated normal sinus rhythm with a nonspecific T wave abnormality.  Review of Systems: As per "subjective", otherwise negative.  Allergies  Allergen Reactions  . Codeine Nausea And Vomiting and Palpitations    Current Outpatient Prescriptions  Medication Sig Dispense Refill  . ACETAMINOPHEN PO Take 500 mg by mouth as needed.      [name] amLODipine (NORVASC) 5 MG tablet Take 5 mg by mouth daily.      . metoprolol succinate (TOPROL-XL) 25 MG 24 hr tablet Take 25 mg by mouth.      . warfarin (COUMADIN) 10 MG tablet TAKE ONE TABLET BY MOUTH ON MONDAYS; THEN TAKE 1/2 TABLET ALL OTHER DAYS.  30 tablet  6   No current facility-administered medications for this visit.    Past Medical History  Diagnosis Date  . Unspecified essential hypertension   . Dizziness   . Paraspinal muscle spasm   . Calculus of kidney     Past Surgical History  Procedure Laterality Date  . Right shoulder surgery    . Back surgery  2009  . Colonoscopy    . Cardiac valve replacement    . Eye surgery      History   Social History  .  Marital Status: Married    Spouse Name: N/A    Number of Children: N/A  . Years of Education: N/A   Occupational History  . Not on file.   Social History Main Topics  . Smoking status: Former Smoker    Types: Cigarettes    Start date: 11/22/1958    Quit date: 11/21/1993  . Smokeless tobacco: Not on file  . Alcohol Use: No  . Drug Use: No  . Sexual Activity: Not on file   Other Topics Concern  . Not on file   Social History Narrative  . No narrative on file     Filed Vitals:   11/21/13 1023  BP: 122/78  Pulse: 63  Height: 5\' 8"  (1.727 m)  Weight: 197 lb 12 oz (89.699 kg)  SpO2: 98%    PHYSICAL EXAM General: NAD HEENT: Normal. Neck: No JVD, no thyromegaly. Lungs: Clear to auscultation bilaterally with normal respiratory effort. CV: Nondisplaced PMI.  Regular rate and rhythm, normal M8/U1 click, no L2/G4, soft I/VI systolic murmur over RUSB, no diastolic murmur. No pretibial or periankle edema.  No carotid bruit.  Normal pedal pulses.  Abdomen: Soft, nontender, no hepatosplenomegaly, no distention.  Neurologic: Alert and oriented x 3.  Psych: Normal affect. Skin: Normal. Musculoskeletal: Normal range of motion, no gross deformities. Extremities: No clubbing or cyanosis.   ECG:  Most recent ECG reviewed.      ASSESSMENT AND PLAN: 1. Bicuspid AV and ascending aortic aneurysm s/p mechanical AVR and aneurysm repair in June 2010: Stable and asymptomatic. No diastolic murmur appreciated, indicating normal valve function. Will have INR checked today. Continue warfarin. 2. Essential HTN: Controlled on amlodipine 5 mg and Toprol-XL 25 mg daily. He regularly monitors his blood pressure at home and it has remained within normal limits. 3. Dietary counseling provided.  Dispo: f/u 1 year.  [name], M.D., F.A.C.C.

## 2013-12-30 ENCOUNTER — Ambulatory Visit (INDEPENDENT_AMBULATORY_CARE_PROVIDER_SITE_OTHER): Payer: Medicare Other | Admitting: *Deleted

## 2013-12-30 DIAGNOSIS — Z5181 Encounter for therapeutic drug level monitoring: Secondary | ICD-10-CM | POA: Diagnosis not present

## 2013-12-30 DIAGNOSIS — Z952 Presence of prosthetic heart valve: Secondary | ICD-10-CM

## 2013-12-30 DIAGNOSIS — I359 Nonrheumatic aortic valve disorder, unspecified: Secondary | ICD-10-CM | POA: Diagnosis not present

## 2013-12-30 DIAGNOSIS — Z7901 Long term (current) use of anticoagulants: Secondary | ICD-10-CM | POA: Diagnosis not present

## 2013-12-30 DIAGNOSIS — Z954 Presence of other heart-valve replacement: Secondary | ICD-10-CM

## 2013-12-30 LAB — POCT INR: INR: 3

## 2014-01-22 DIAGNOSIS — D485 Neoplasm of uncertain behavior of skin: Secondary | ICD-10-CM | POA: Diagnosis not present

## 2014-01-22 DIAGNOSIS — C44629 Squamous cell carcinoma of skin of left upper limb, including shoulder: Secondary | ICD-10-CM | POA: Diagnosis not present

## 2014-01-22 DIAGNOSIS — Z85828 Personal history of other malignant neoplasm of skin: Secondary | ICD-10-CM | POA: Diagnosis not present

## 2014-01-22 DIAGNOSIS — L570 Actinic keratosis: Secondary | ICD-10-CM | POA: Diagnosis not present

## 2014-01-22 DIAGNOSIS — D225 Melanocytic nevi of trunk: Secondary | ICD-10-CM | POA: Diagnosis not present

## 2014-02-10 ENCOUNTER — Ambulatory Visit (INDEPENDENT_AMBULATORY_CARE_PROVIDER_SITE_OTHER): Payer: Medicare Other | Admitting: *Deleted

## 2014-02-10 DIAGNOSIS — Z7901 Long term (current) use of anticoagulants: Secondary | ICD-10-CM | POA: Diagnosis not present

## 2014-02-10 DIAGNOSIS — Z952 Presence of prosthetic heart valve: Secondary | ICD-10-CM

## 2014-02-10 DIAGNOSIS — Z5181 Encounter for therapeutic drug level monitoring: Secondary | ICD-10-CM | POA: Diagnosis not present

## 2014-02-10 DIAGNOSIS — Z954 Presence of other heart-valve replacement: Secondary | ICD-10-CM | POA: Diagnosis not present

## 2014-02-10 DIAGNOSIS — I359 Nonrheumatic aortic valve disorder, unspecified: Secondary | ICD-10-CM

## 2014-02-10 LAB — POCT INR: INR: 3.2

## 2014-02-11 DIAGNOSIS — C44621 Squamous cell carcinoma of skin of unspecified upper limb, including shoulder: Secondary | ICD-10-CM | POA: Diagnosis not present

## 2014-03-19 ENCOUNTER — Telehealth: Payer: Self-pay | Admitting: *Deleted

## 2014-03-19 MED ORDER — WARFARIN SODIUM 10 MG PO TABS
ORAL_TABLET | ORAL | Status: DC
Start: 2014-03-19 — End: 2020-02-06

## 2014-03-19 NOTE — Telephone Encounter (Signed)
Patient wants to know if he can get RX for 90 day supply for Warfarin sent to [facility] in [place] / tgs

## 2014-03-24 ENCOUNTER — Ambulatory Visit (INDEPENDENT_AMBULATORY_CARE_PROVIDER_SITE_OTHER): Payer: Medicare Other | Admitting: *Deleted

## 2014-03-24 DIAGNOSIS — I359 Nonrheumatic aortic valve disorder, unspecified: Secondary | ICD-10-CM

## 2014-03-24 DIAGNOSIS — Z7901 Long term (current) use of anticoagulants: Secondary | ICD-10-CM | POA: Diagnosis not present

## 2014-03-24 DIAGNOSIS — Z5181 Encounter for therapeutic drug level monitoring: Secondary | ICD-10-CM | POA: Diagnosis not present

## 2014-03-24 DIAGNOSIS — Z952 Presence of prosthetic heart valve: Secondary | ICD-10-CM

## 2014-03-24 DIAGNOSIS — Z954 Presence of other heart-valve replacement: Secondary | ICD-10-CM

## 2014-03-24 LAB — POCT INR: INR: 3.7

## 2014-03-29 DIAGNOSIS — R0789 Other chest pain: Secondary | ICD-10-CM | POA: Diagnosis not present

## 2014-03-29 DIAGNOSIS — Z7901 Long term (current) use of anticoagulants: Secondary | ICD-10-CM | POA: Diagnosis not present

## 2014-03-29 DIAGNOSIS — N19 Unspecified kidney failure: Secondary | ICD-10-CM | POA: Diagnosis not present

## 2014-03-29 DIAGNOSIS — R0600 Dyspnea, unspecified: Secondary | ICD-10-CM | POA: Diagnosis not present

## 2014-03-29 DIAGNOSIS — J22 Unspecified acute lower respiratory infection: Secondary | ICD-10-CM | POA: Diagnosis not present

## 2014-03-29 DIAGNOSIS — R0689 Other abnormalities of breathing: Secondary | ICD-10-CM | POA: Diagnosis not present

## 2014-03-30 ENCOUNTER — Inpatient Hospital Stay (HOSPITAL_COMMUNITY)
Admission: EM | Admit: 2014-03-30 | Discharge: 2014-04-01 | DRG: 194 | Disposition: A | Discharge status: Home / Self Care | Payer: Medicare Other | Attending: Internal Medicine | Admitting: Internal Medicine

## 2014-03-30 ENCOUNTER — Emergency Department (HOSPITAL_COMMUNITY): Payer: Medicare Other

## 2014-03-30 ENCOUNTER — Encounter (HOSPITAL_COMMUNITY): Payer: Self-pay

## 2014-03-30 DIAGNOSIS — Z800 Family history of malignant neoplasm of digestive organs: Secondary | ICD-10-CM | POA: Diagnosis not present

## 2014-03-30 DIAGNOSIS — N179 Acute kidney failure, unspecified: Secondary | ICD-10-CM | POA: Diagnosis present

## 2014-03-30 DIAGNOSIS — J432 Centrilobular emphysema: Secondary | ICD-10-CM | POA: Diagnosis not present

## 2014-03-30 DIAGNOSIS — J988 Other specified respiratory disorders: Secondary | ICD-10-CM | POA: Diagnosis not present

## 2014-03-30 DIAGNOSIS — Z954 Presence of other heart-valve replacement: Secondary | ICD-10-CM

## 2014-03-30 DIAGNOSIS — R0902 Hypoxemia: Secondary | ICD-10-CM | POA: Diagnosis present

## 2014-03-30 DIAGNOSIS — N19 Unspecified kidney failure: Secondary | ICD-10-CM | POA: Diagnosis not present

## 2014-03-30 DIAGNOSIS — J209 Acute bronchitis, unspecified: Secondary | ICD-10-CM | POA: Diagnosis present

## 2014-03-30 DIAGNOSIS — Z952 Presence of prosthetic heart valve: Secondary | ICD-10-CM

## 2014-03-30 DIAGNOSIS — Z885 Allergy status to narcotic agent status: Secondary | ICD-10-CM | POA: Diagnosis not present

## 2014-03-30 DIAGNOSIS — R0602 Shortness of breath: Secondary | ICD-10-CM | POA: Diagnosis not present

## 2014-03-30 DIAGNOSIS — Z8249 Family history of ischemic heart disease and other diseases of the circulatory system: Secondary | ICD-10-CM

## 2014-03-30 DIAGNOSIS — J189 Pneumonia, unspecified organism: Principal | ICD-10-CM | POA: Diagnosis present

## 2014-03-30 DIAGNOSIS — J984 Other disorders of lung: Secondary | ICD-10-CM

## 2014-03-30 DIAGNOSIS — Z806 Family history of leukemia: Secondary | ICD-10-CM | POA: Diagnosis not present

## 2014-03-30 DIAGNOSIS — I2510 Atherosclerotic heart disease of native coronary artery without angina pectoris: Secondary | ICD-10-CM | POA: Diagnosis not present

## 2014-03-30 DIAGNOSIS — Z7901 Long term (current) use of anticoagulants: Secondary | ICD-10-CM | POA: Diagnosis not present

## 2014-03-30 DIAGNOSIS — Z801 Family history of malignant neoplasm of trachea, bronchus and lung: Secondary | ICD-10-CM

## 2014-03-30 DIAGNOSIS — Z87891 Personal history of nicotine dependence: Secondary | ICD-10-CM

## 2014-03-30 DIAGNOSIS — J181 Lobar pneumonia, unspecified organism: Secondary | ICD-10-CM | POA: Diagnosis not present

## 2014-03-30 DIAGNOSIS — I10 Essential (primary) hypertension: Secondary | ICD-10-CM | POA: Diagnosis present

## 2014-03-30 DIAGNOSIS — I509 Heart failure, unspecified: Secondary | ICD-10-CM | POA: Diagnosis not present

## 2014-03-30 DIAGNOSIS — R0989 Other specified symptoms and signs involving the circulatory and respiratory systems: Secondary | ICD-10-CM

## 2014-03-30 DIAGNOSIS — J188 Other pneumonia, unspecified organism: Secondary | ICD-10-CM | POA: Diagnosis not present

## 2014-03-30 HISTORY — DX: Long term (current) use of anticoagulants: Z79.01

## 2014-03-30 HISTORY — DX: Thoracic aortic aneurysm, without rupture: I71.2

## 2014-03-30 LAB — CBC WITH DIFFERENTIAL/PLATELET
BASOS ABS: 0.1 10*3/uL (ref 0.0–0.1)
Basophils Relative: 0 % (ref 0–1)
EOS PCT: 1 % (ref 0–5)
Eosinophils Absolute: 0.1 10*3/uL (ref 0.0–0.7)
HCT: 38 % — ABNORMAL LOW (ref 39.0–52.0)
Hemoglobin: 12.6 g/dL — ABNORMAL LOW (ref 13.0–17.0)
LYMPHS PCT: 10 % — AB (ref 12–46)
Lymphs Abs: 1.8 10*3/uL (ref 0.7–4.0)
MCH: 28 pg (ref 26.0–34.0)
MCHC: 33.2 g/dL (ref 30.0–36.0)
MCV: 84.4 fL (ref 78.0–100.0)
MONO ABS: 1.7 10*3/uL — AB (ref 0.1–1.0)
MONOS PCT: 9 % (ref 3–12)
Neutro Abs: 14.9 10*3/uL — ABNORMAL HIGH (ref 1.7–7.7)
Neutrophils Relative %: 80 % — ABNORMAL HIGH (ref 43–77)
PLATELETS: 292 10*3/uL (ref 150–400)
RBC: 4.5 MIL/uL (ref 4.22–5.81)
RDW: 13.5 % (ref 11.5–15.5)
WBC: 18.6 10*3/uL — ABNORMAL HIGH (ref 4.0–10.5)

## 2014-03-30 LAB — COMPREHENSIVE METABOLIC PANEL
ALT: 32 U/L (ref 0–53)
AST: 39 U/L — ABNORMAL HIGH (ref 0–37)
Albumin: 3.8 g/dL (ref 3.5–5.2)
Alkaline Phosphatase: 63 U/L (ref 39–117)
Anion gap: 5 (ref 5–15)
BILIRUBIN TOTAL: 0.6 mg/dL (ref 0.3–1.2)
BUN: 22 mg/dL (ref 6–23)
CALCIUM: 9.3 mg/dL (ref 8.4–10.5)
CO2: 26 mmol/L (ref 19–32)
CREATININE: 1.69 mg/dL — AB (ref 0.50–1.35)
Chloride: 104 mmol/L (ref 96–112)
GFR calc Af Amer: 47 mL/min — ABNORMAL LOW (ref >90)
GFR calc non Af Amer: 40 mL/min — ABNORMAL LOW (ref >90)
Glucose, Bld: 82 mg/dL (ref 70–99)
Potassium: 4.3 mmol/L (ref 3.5–5.1)
Sodium: 135 mmol/L (ref 135–145)
TOTAL PROTEIN: 8 g/dL (ref 6.0–8.3)

## 2014-03-30 LAB — PROTIME-INR
INR: 3 — ABNORMAL HIGH (ref 0.00–1.49)
Prothrombin Time: 31.4 seconds — ABNORMAL HIGH (ref 11.6–15.2)

## 2014-03-30 LAB — BRAIN NATRIURETIC PEPTIDE: B NATRIURETIC PEPTIDE 5: 37 pg/mL (ref 0.0–100.0)

## 2014-03-30 LAB — TROPONIN I: Troponin I: <0.03 ng/mL (ref <0.031)

## 2014-03-30 MED ORDER — ACETAMINOPHEN 650 MG RE SUPP: 650.0000 mg | Freq: Four times a day (QID) | RECTAL | Status: DC | PRN

## 2014-03-30 MED ORDER — ALBUTEROL SULFATE (2.5 MG/3ML) 0.083% IN NEBU
5.0000 mg | INHALATION_SOLUTION | Freq: Once | RESPIRATORY_TRACT | Status: DC
Start: 2014-03-30 — End: 2014-03-30

## 2014-03-30 MED ORDER — ONDANSETRON HCL 4 MG PO TABS: 4.0000 mg | ORAL_TABLET | Freq: Four times a day (QID) | ORAL | Status: DC | PRN

## 2014-03-30 MED ORDER — ALBUTEROL SULFATE (2.5 MG/3ML) 0.083% IN NEBU
2.5000 mg | INHALATION_SOLUTION | Freq: Four times a day (QID) | RESPIRATORY_TRACT | Status: DC
  Administered 2014-03-30 (×2): 2.5 mg via RESPIRATORY_TRACT
  Filled 2014-03-30 (×2): qty 3

## 2014-03-30 MED ORDER — ACETAMINOPHEN 325 MG PO TABS: 650.0000 mg | ORAL_TABLET | Freq: Four times a day (QID) | ORAL | Status: DC | PRN

## 2014-03-30 MED ORDER — HYDROCOD POLST-CHLORPHEN POLST 10-8 MG/5ML PO LQCR: 5.0000 mL | Freq: Two times a day (BID) | ORAL | Status: DC | PRN

## 2014-03-30 MED ORDER — DEXTROSE 5 % IV SOLN
500.0000 mg | INTRAVENOUS | Status: DC
  Filled 2014-03-30: qty 500

## 2014-03-30 MED ORDER — AMLODIPINE BESYLATE 5 MG PO TABS
5.0000 mg | ORAL_TABLET | Freq: Every day | ORAL | Status: DC
  Administered 2014-03-30 – 2014-03-31 (×2): 5 mg via ORAL
  Filled 2014-03-30 (×2): qty 1

## 2014-03-30 MED ORDER — DEXTROSE 5 % IV SOLN
500.0000 mg | Freq: Once | INTRAVENOUS | Status: AC
  Administered 2014-03-30: 500 mg via INTRAVENOUS
  Filled 2014-03-30: qty 500

## 2014-03-30 MED ORDER — HYDROCODONE-ACETAMINOPHEN 5-325 MG PO TABS
1.0000 | ORAL_TABLET | ORAL | Status: DC | PRN
  Administered 2014-03-31: 1 via ORAL
  Filled 2014-03-30: qty 2

## 2014-03-30 MED ORDER — POTASSIUM CHLORIDE IN NACL 20-0.9 MEQ/L-% IV SOLN
INTRAVENOUS | Status: AC
  Administered 2014-03-30: 18:00:00 via INTRAVENOUS

## 2014-03-30 MED ORDER — DEXTROSE 5 % IV SOLN
1.0000 g | Freq: Once | INTRAVENOUS | Status: AC
  Administered 2014-03-30: 1 g via INTRAVENOUS
  Filled 2014-03-30: qty 10

## 2014-03-30 MED ORDER — POTASSIUM CHLORIDE IN NACL 20-0.9 MEQ/L-% IV SOLN: INTRAVENOUS | Status: DC

## 2014-03-30 MED ORDER — METOPROLOL SUCCINATE ER 25 MG PO TB24
25.0000 mg | ORAL_TABLET | Freq: Every day | ORAL | Status: DC
  Administered 2014-03-30 – 2014-03-31 (×2): 25 mg via ORAL
  Filled 2014-03-30 (×2): qty 1

## 2014-03-30 MED ORDER — ONDANSETRON HCL 4 MG/2ML IJ SOLN: 4.0000 mg | Freq: Four times a day (QID) | INTRAMUSCULAR | Status: DC | PRN

## 2014-03-30 MED ORDER — WARFARIN SODIUM 5 MG PO TABS
5.0000 mg | ORAL_TABLET | Freq: Once | ORAL | Status: AC
  Administered 2014-03-30: 5 mg via ORAL
  Filled 2014-03-30: qty 1

## 2014-03-30 MED ORDER — CEFTRIAXONE SODIUM IN DEXTROSE 20 MG/ML IV SOLN
1.0000 g | INTRAVENOUS | Status: DC
  Administered 2014-03-31: 1 g via INTRAVENOUS
  Filled 2014-03-30 (×2): qty 50

## 2014-03-30 MED ORDER — WARFARIN - PHARMACIST DOSING INPATIENT: Status: DC

## 2014-03-30 MED ORDER — BENZONATATE 100 MG PO CAPS
100.0000 mg | ORAL_CAPSULE | Freq: Three times a day (TID) | ORAL | Status: DC
  Administered 2014-03-30 – 2014-03-31 (×5): 100 mg via ORAL
  Filled 2014-03-30 (×5): qty 1

## 2014-03-30 NOTE — H&P (Signed)
[facility] History and Physical  Stephen Moses ENI:[id number] DOB: [birth year] DOA: 03/30/2014  Referring physician: ED physician, Dr. Stark Jock PCP: [name], MD   Chief Complaint: Shortness of breath and chest congestion.  HPI: Stephen Moses is a 69 y.o. male with a history of aortic valve replacement-on chronic Coumadin hypertension, and kidney stones, who presents to the emergency department with a chief complaint of shortness of breath and cough. His symptoms started approximately 5 days ago. It started with a dry cough which has become mildly productive with grayish colored sputum. He has had associated body aches, chills, chest congestion with wheezing, and pleurisy. He denies cardiac associated chest pain, nausea, vomiting, or diarrhea. He denies swelling in his legs. He denies pain with urination. He denies black tarry stools or bright red blood per rectum. He went to a [facility] in [place] yesterday for his symptoms and was prescribed Zithromax. His symptoms did not improve, so he presents today.  In the ED, he is afebrile and hemodynamically stable, but he was initially mildly tachycardic. His chest x-ray revealed vague nodular opacity on the PA image, left lower lobe pneumonia superimposed on mild to moderate changes of acute bronchitis and/or asthma on top of baseline COPD/emphysema. His lab data are significant for a creatinine of 1.69, WBC of 18.6, and hemoglobin of 12.6. His troponin I and BNP are within normal limits. His INR is 3.7. He is being admitted for further evaluation and management.    Review of Systems:  As above in history present illness, otherwise negative.  Past Medical History  Diagnosis Date  . Unspecified essential hypertension   . Dizziness   . Paraspinal muscle spasm   . Calculus of kidney   . AORTIC VALVE REPLACEMENT, HX OF 07/06/2009    Qualifier: Diagnosis of  By: Purcell Nails, NP, Curt Bears    . Ascending aortic aneurysm 09/16/2011  . Long term  current use of anticoagulant 05/30/2010   Past Surgical History  Procedure Laterality Date  . Right shoulder surgery    . Back surgery  2009  . Colonoscopy    . Cardiac valve replacement    . Eye surgery     Social History: He is married. He has no children. He is retired. He reports that he quit smoking about 20 years ago. His smoking use included Cigarettes. He started smoking about 55 years ago. He does not have any smokeless tobacco history on file. He reports that he does not drink alcohol or use illicit drugs.  Allergies  Allergen Reactions  . Codeine Nausea And Vomiting and Palpitations    Family History  Problem Relation Age of Onset  . Leukemia Brother   . Heart attack Brother   . Lung cancer Brother   . Colon cancer Brother   . Leukemia Sister     Prior to Admission medications   Medication Sig Start Date End Date Taking? Authorizing Provider  amLODipine (NORVASC) 5 MG tablet Take 5 mg by mouth at bedtime.    Yes Historical Provider, MD  azithromycin (ZITHROMAX) 250 MG tablet Take 250-500 mg by mouth daily. Take 500 mg on day 1, then take 250 mg daily until gone. 03/29/14  Yes Historical Provider, MD  dextromethorphan-guaiFENesin (MUCINEX DM) 30-600 MG per 12 hr tablet Take 1 tablet by mouth 2 (two) times daily as needed for cough.   Yes Historical Provider, MD  metoprolol succinate (TOPROL-XL) 25 MG 24 hr tablet Take 25 mg by mouth at bedtime.  06/06/12  Yes Nevin Bloodgood  Alcus Dad, MD  warfarin (COUMADIN) 10 MG tablet Take 1/2 tablet daily or as directed Patient taking differently: Take 10 mg by mouth daily at 6 PM. Take 1/2 tablet daily or as directed 03/19/14  Yes Herminio Commons, MD   Physical Exam: Filed Vitals:   03/30/14 1200 03/30/14 1211 03/30/14 1230 03/30/14 1318  BP: 120/76 120/76 127/76 136/68  Pulse: 87 85 78 87  Temp:  98 F (36.7 C)  99 F (37.2 C)  TempSrc:    Oral  Resp: 26 16 26    Height:    5\' 8"  (1.727 m)  Weight:    87.544 kg (193 lb)  SpO2: 94% 95%  94% 96%    Wt Readings from Last 3 Encounters:  03/30/14 87.544 kg (193 lb)  11/21/13 89.699 kg (197 lb 12 oz)  10/05/12 82.101 kg (181 lb)    General:  Appears calm and comfortable; pleasant 69 year old man alert, and in no acute distress. Eyes: PERRL, normal lids, irises & conjunctiva; conjunctivae are clear and sclerae are white. ENT: grossly normal hearing; oropharynx reveals mildly dry mucous membranes with no posterior pharyngeal exudates or erythema. Neck: no LAD, masses or thyromegaly Cardiovascular: S1, S2, with an S2 click. No LE edema. Telemetry: SR, no arrhythmias  Respiratory: Occasional-few and scattered crackles and wheezes; breathing nonlabored. Abdomen: soft, ntnd; bowel sounds present; no distention or hepatosplenomegaly. Skin: no rash or induration seen on limited exam Musculoskeletal: grossly normal tone BUE/BLE; no hot acute joints. Psychiatric: grossly normal mood and affect, speech fluent and appropriate Neurologic: grossly non-focal; cranial nerves II through XII are intact. Strength is grossly 5 over 5 throughout sensation is grossly intact.           Labs on Admission:  Basic Metabolic Panel:  Recent Labs Lab 03/30/14 1018  NA 135  K 4.3  CL 104  CO2 26  GLUCOSE 82  BUN 22  CREATININE 1.69*  CALCIUM 9.3   Liver Function Tests:  Recent Labs Lab 03/30/14 1018  AST 39*  ALT 32  ALKPHOS 63  BILITOT 0.6  PROT 8.0  ALBUMIN 3.8   No results for input(s): LIPASE, AMYLASE in the last 168 hours. No results for input(s): AMMONIA in the last 168 hours. CBC:  Recent Labs Lab 03/30/14 1018  WBC 18.6*  NEUTROABS 14.9*  HGB 12.6*  HCT 38.0*  MCV 84.4  PLT 292   Cardiac Enzymes:  Recent Labs Lab 03/30/14 1018  TROPONINI <0.03    BNP (last 3 results)  Recent Labs  03/30/14 1018  BNP 37.0    ProBNP (last 3 results) No results for input(s): PROBNP in the last 8760 hours.  CBG: No results for input(s): GLUCAP in the last 168  hours.  Radiological Exams on Admission: Dg Chest 2 View  03/30/2014   CLINICAL DATA:  Three day history of progressively worsening shortness of breath. Leukocytosis with white blood count 17000. Prior aortic valve replacement.  EXAM: CHEST  2 VIEW  COMPARISON:  08/18/2008 dating back to 02/11/2008. CTA chest 07/11/2008.  FINDINGS: Sternotomy for aortic valve replacement. Cardiac silhouette normal in size, unchanged. Thoracic aorta atherosclerotic, unchanged. Hilar and mediastinal contours otherwise unremarkable. Vague approximate 1.5 cm nodular opacity on the PA image, not localized on the lateral. Emphysematous changes throughout both lungs as noted previously. Prominent bronchovascular markings diffusely and mild to moderate central peribronchial thickening, more so than on the prior examination. Focal airspace opacities in the medial left lower lobe. No pleural effusions. No pneumothorax. Normal  pulmonary vascularity. Mild degenerative changes involving the thoracic spine.  IMPRESSION: 1. Vague nodular opacity on the PA image, not localized on the lateral image, not visualized previously. At some point, a non-emergent CT of the chest is recommended, with contrast if the patient has normal renal function. 2. Left lower lobe pneumonia superimposed upon mild to moderate changes of acute bronchitis and/or asthma. These findings are on top of baseline COPD/emphysema.   Electronically Signed   By: Evangeline Dakin M.D.   On: 03/30/2014 11:39    EKG: Independently reviewed. Sinus tachycardia with a heart rate of 121 bpm; no concerning ST or T-wave abnormalities.  Assessment/Plan Principal Problem:   CAP (community acquired pneumonia) Active Problems:   Acute bronchitis   H/O aortic valve replacement   Acute kidney injury   Lung density on x-ray   Long term current use of anticoagulant   Essential hypertension   1. Community-acquired pneumonia superimposed on acute bronchitis. The patient is  afebrile, but his white blood cell count is elevated. He does not appear to be toxic. However, he did not improve with short-term outpatient treatment, and it is reasonable to admit him for IV antibiotics for at least 48 hours and to provide supportive treatment. He received azithromycin and Rocephin in the emergency department; these will be continued. We'll also add albuterol nebulizer every 6 hours, Tessalon Perles-scheduled, and when necessary Tussionex. 2. Lung density seen on the chest x-ray. Per radiology, would recommend nonemergent CT scan of his chest electively for further evaluation or surveillance. 3. History of aortic valve replacement, on chronic Coumadin. His INR is 3.7. We'll continue Coumadin and will ask the pharmacist to assist with dosing. 4. Hypertension. Currently stable. Will continue metoprolol and amlodipine. 5. Acute kidney injury. The patient's baseline creatinine is unknown, but in March of 2015, it was 1.28. On admission, it is 1.69. This is presumed to be prerenal azotemia. Will start gentle IV fluid hydration with normal saline for 24 more hours. We'll continue to monitor his renal function.      Code Status: Full code DVT Prophylaxis: Coumadin Family Communication: Discussed with his wife Disposition Plan: Anticipate discharge to home in a 48-72 hours.  Time spent: One hour.  [facility] Pager [phone]

## 2014-03-30 NOTE — ED Notes (Addendum)
Pt sent by Doctor in [place] today for worsening SOB.  Reports SOB x 3 days.  Has history of aortic valve replacement several years ago.  PCP reports pt's wbc 16,000 yesterday and 17,000 today.  Reports chest x ray neg.

## 2014-03-30 NOTE — ED Provider Notes (Signed)
CSN: [id number]     Arrival date & time 03/30/14  2334 History   First MD Initiated Contact with Patient 03/30/14 0954     This chart was scribed for Veryl Speak, MD by Forrestine Him, ED Scribe. This patient was seen in room APA18/APA18 and the patient's care was started 10:01 AM.   Chief Complaint  Patient presents with  . Shortness of Breath   Patient is a 69 y.o. male presenting with shortness of breath. The history is provided by the patient. No language interpreter was used.  Shortness of Breath Onset quality:  Gradual Duration:  3 days Timing:  Constant Progression:  Unchanged Chronicity:  New Relieved by:  None tried Worsened by:  Nothing tried Ineffective treatments:  None tried Associated symptoms: cough   Associated symptoms: no chest pain   Cough:    Cough characteristics:  Productive   Sputum characteristics:  Unable to specify   Severity:  Moderate   Onset quality:  Gradual   Duration:  3 days   Timing:  Constant   Progression:  Unchanged   Chronicity:  New   HPI Comments: CRANFORD BLESSINGER is a 69 y.o. male with a PMHx of HTN who presents to the Emergency Department complaining of constant, ongoing shortness of breath x 3-4 days that is unchanged. He also reports a mildly productive cough. Pt was advised to come to ED per [facility] in [place] after no improvement from visit yesterday or today. At time of initial visit, pt was started on Zithromax. CXR performed without any abnormal findings. WBC during visit was 16,000 yesterday and 17,000 today. No recent CP, fever, or chills. No prior history of bronchitis, COPD, or emphysema. Mr. Deren  takes Coumadin daily. Pt had an aortic valve replacement several years ago after an aneurysm. Pt with known allergy to Codeine.  Past Medical History  Diagnosis Date  . Unspecified essential hypertension   . Dizziness   . Paraspinal muscle spasm   . Calculus of kidney    Past Surgical History  Procedure Laterality Date  .  Right shoulder surgery    . Back surgery  2009  . Colonoscopy    . Cardiac valve replacement    . Eye surgery     Family History  Problem Relation Age of Onset  . Leukemia Brother   . Heart attack Brother   . Lung cancer Brother   . Colon cancer Brother   . Leukemia Sister    History  Substance Use Topics  . Smoking status: Former Smoker    Types: Cigarettes    Start date: 11/22/1958    Quit date: 11/21/1993  . Smokeless tobacco: Not on file  . Alcohol Use: No    Review of Systems  Constitutional: Positive for fatigue.  Respiratory: Positive for cough and shortness of breath.   Cardiovascular: Negative for chest pain.  All other systems reviewed and are negative.     Allergies  Codeine  Home Medications   Prior to Admission medications   Medication Sig Start Date End Date Taking? Authorizing Provider  ACETAMINOPHEN PO Take 500 mg by mouth as needed.    Historical Provider, MD  amLODipine (NORVASC) 5 MG tablet Take 5 mg by mouth daily.    Historical Provider, MD  metoprolol succinate (TOPROL-XL) 25 MG 24 hr tablet Take 25 mg by mouth. 06/06/12   Fay Records, MD  warfarin (COUMADIN) 10 MG tablet Take 1/2 tablet daily or as directed 03/19/14   Jamesetta So  Blanchard Mane, MD   Triage Vitals: Pulse 128  Temp(Src) 97.8 F (36.6 C) (Oral)  Resp 18  Ht 5\' 8"  (1.727 m)  Wt 197 lb (89.359 kg)  BMI 29.96 kg/m2   Physical Exam  Constitutional: He is oriented to person, place, and time. He appears well-developed and well-nourished.  HENT:  Head: Normocephalic and atraumatic.  Eyes: EOM are normal.  Neck: Normal range of motion.  Cardiovascular: Normal rate, regular rhythm and intact distal pulses.   Murmur heard. There is accentuated S2  Pulmonary/Chest: Effort normal. No respiratory distress. He has rales.  Slight rales in bases bilaterally  Abdominal: Soft. He exhibits no distension. There is no tenderness.  Musculoskeletal: Normal range of motion.  Neurological: He is  alert and oriented to person, place, and time.  Skin: Skin is warm and dry.  Psychiatric: He has a normal mood and affect. Judgment normal.  Nursing note and vitals reviewed.   ED Course  Procedures (including critical care time)  DIAGNOSTIC STUDIES: Oxygen Saturation is 92% on room air, low by my interpretation.    COORDINATION OF CARE: 10:11 AM- Will order EKG, CXR, and blood work. Discussed treatment plan with pt at bedside and pt agreed to plan.     Labs Review Labs Reviewed - No data to display  Imaging Review No results found.   EKG Interpretation   Date/Time:  Sunday March 30 2014 09:59:52 EST Ventricular Rate:  121 PR Interval:    QRS Duration: 91 QT Interval:  352 QTC Calculation: 499 R Axis:   73 Text Interpretation:  Borderline prolonged QT interval Confirmed by Beau Fanny   MD, Starling Jessie ([id number]) on 03/30/2014 11:49:05 AM      MDM   Final diagnoses:  None    Patient is a 69 year old male who presents with a four-day history of shortness of breath and cough. He has been seen as an outpatient and found to have an elevated white count. He was started on Zithromax yesterday, however is not improving. Today's workup reveals borderline hypoxia with a white count of 18.6 and chest x-ray which reveals a left lower lobe pneumonia.  I've discussed these findings in this patient with Dr. Caryn Section from the hospitalist service who agrees to admit. We will give Rocephin and Zithromax as well as a breathing treatment plan admit to her service.  I personally performed the services described in this documentation, which was scribed in my presence. The recorded information has been reviewed and is accurate.       Veryl Speak, MD 03/30/14 1218

## 2014-03-30 NOTE — ED Notes (Signed)
Patient ambulated to restroom steadily, no complaints.

## 2014-03-30 NOTE — Progress Notes (Signed)
ANTICOAGULATION CONSULT NOTE - Initial Consult  Pharmacy Consult for Coumadin Indication: mechanical heart valva  Allergies  Allergen Reactions  . Codeine Nausea And Vomiting and Palpitations    Patient Measurements: Height: 5\' 8"  (172.7 cm) Weight: 193 lb (87.544 kg) IBW/kg (Calculated) : 68.4  Vital Signs: Temp: 99 F (37.2 C) (02/07 1318) Temp Source: Oral (02/07 1318) BP: 136/68 mmHg (02/07 1318) Pulse Rate: 87 (02/07 1318)  Labs:  Recent Labs  03/30/14 1018  HGB 12.6*  HCT 38.0*  PLT 292  CREATININE 1.69*  TROPONINI <0.03    Estimated Creatinine Clearance: 45.6 mL/min (by C-G formula based on Cr of 1.69).   Medical History: Past Medical History  Diagnosis Date  . Unspecified essential hypertension   . Dizziness   . Paraspinal muscle spasm   . Calculus of kidney   . AORTIC VALVE REPLACEMENT, HX OF 07/06/2009    Qualifier: Diagnosis of  By: Purcell Nails, NP, Curt Bears    . Ascending aortic aneurysm 09/16/2011  . Long term current use of anticoagulant 05/30/2010    Medications:  Prescriptions prior to admission  Medication Sig Dispense Refill Last Dose  . amLODipine (NORVASC) 5 MG tablet Take 5 mg by mouth at bedtime.    03/29/2014 at Unknown time  . azithromycin (ZITHROMAX) 250 MG tablet Take 250-500 mg by mouth daily. Take 500 mg on day 1, then take 250 mg daily until gone.   03/30/2014 at Unknown time  . dextromethorphan-guaiFENesin (MUCINEX DM) 30-600 MG per 12 hr tablet Take 1 tablet by mouth 2 (two) times daily as needed for cough.   03/30/2014 at Unknown time  . metoprolol succinate (TOPROL-XL) 25 MG 24 hr tablet Take 25 mg by mouth at bedtime.    03/29/2014 at 1730  . warfarin (COUMADIN) 10 MG tablet Take 1/2 tablet daily or as directed (Patient taking differently: Take 10 mg by mouth daily at 6 PM. Take 1/2 tablet daily or as directed) 90 tablet 3 03/29/2014 at Unknown time    Assessment: 69 yo M on chronic Coumadin for hx MVR.  Home dose listed above.  No baseline  INR.   Patient is admitted for CAP and started on Rocephin/Zithromax.  Zithromax can interact with warfarin to increase INR.   No bleeding noted.   Goal of Therapy:  INR 2.5-3.5   Plan:  Check baseline INR now- Coumadin dose once baseline INR established Daily INR  Tanishka Drolet, Lavonia Drafts 03/30/2014,2:30 PM  Addendum.  INR therapeutic.  Continue Coumadin 5mg  (home dose) x1 tonight.  F/U am labs.  Netta Cedars, PharmD, BCPS 03/30/2014@6 :02 PM

## 2014-03-30 NOTE — ED Notes (Signed)
Report given to [facility], Dept 300, all questions answered. Azithromycin sent with patient for infusion, IV flushed and locked for transport, Cindy aware. Respiratory called to make sure Albuterol treatment from 12:15 will rollover once patient is transferred to floor. NAD. VSS.

## 2014-03-30 NOTE — Progress Notes (Signed)
Discharge teaching done, all questions answered.  Pt taken via wheelchair to main entrance to meet son's car.  Scripts given.  Pt stable at time of discharge.

## 2014-03-30 NOTE — ED Notes (Signed)
Report given to [facility] on Dept 300, all questions answered.

## 2014-03-31 ENCOUNTER — Inpatient Hospital Stay (HOSPITAL_COMMUNITY): Payer: Medicare Other

## 2014-03-31 LAB — BASIC METABOLIC PANEL
Anion gap: 6 (ref 5–15)
BUN: 19 mg/dL (ref 6–23)
CHLORIDE: 107 mmol/L (ref 96–112)
CO2: 25 mmol/L (ref 19–32)
Calcium: 8.6 mg/dL (ref 8.4–10.5)
Creatinine, Ser: 1.61 mg/dL — ABNORMAL HIGH (ref 0.50–1.35)
GFR calc non Af Amer: 43 mL/min — ABNORMAL LOW (ref >90)
GFR, EST AFRICAN AMERICAN: 49 mL/min — AB (ref >90)
Glucose, Bld: 102 mg/dL — ABNORMAL HIGH (ref 70–99)
Potassium: 4.2 mmol/L (ref 3.5–5.1)
Sodium: 138 mmol/L (ref 135–145)

## 2014-03-31 LAB — CBC
HCT: 33.2 % — ABNORMAL LOW (ref 39.0–52.0)
HEMOGLOBIN: 10.9 g/dL — AB (ref 13.0–17.0)
MCH: 27.7 pg (ref 26.0–34.0)
MCHC: 32.8 g/dL (ref 30.0–36.0)
MCV: 84.5 fL (ref 78.0–100.0)
PLATELETS: 252 10*3/uL (ref 150–400)
RBC: 3.93 MIL/uL — AB (ref 4.22–5.81)
RDW: 13.7 % (ref 11.5–15.5)
WBC: 12 10*3/uL — AB (ref 4.0–10.5)

## 2014-03-31 LAB — PROTIME-INR
INR: 3.13 — ABNORMAL HIGH (ref 0.00–1.49)
Prothrombin Time: 32.5 seconds — ABNORMAL HIGH (ref 11.6–15.2)

## 2014-03-31 MED ORDER — AZITHROMYCIN 250 MG PO TABS
500.0000 mg | ORAL_TABLET | Freq: Every day | ORAL | Status: DC
  Administered 2014-03-31: 500 mg via ORAL
  Filled 2014-03-31 (×2): qty 2

## 2014-03-31 MED ORDER — WARFARIN SODIUM 5 MG PO TABS
5.0000 mg | ORAL_TABLET | Freq: Once | ORAL | Status: AC
  Administered 2014-03-31: 5 mg via ORAL
  Filled 2014-03-31: qty 1

## 2014-03-31 MED ORDER — ALBUTEROL SULFATE (2.5 MG/3ML) 0.083% IN NEBU: 2.5000 mg | INHALATION_SOLUTION | Freq: Four times a day (QID) | RESPIRATORY_TRACT | Status: DC | PRN

## 2014-03-31 NOTE — Progress Notes (Signed)
Subjective: Stephen Moses was admitted yesterday with pneumonia. He is feeling better. He feels less short of breath. Oxygen saturation is 95% on room air. He denies any fever. He has been treated with ceftriaxone and azithromycin. He had an initial leukocytosis however today's CBC is pending.  Objective: Vital signs in last 24 hours: Filed Vitals:   03/30/14 1507 03/30/14 1925 03/30/14 2224 03/31/14 0435  BP:   140/69 111/66  Pulse:   89 76  Temp:   98.5 F (36.9 C) 98.6 F (37 C)  TempSrc:   Oral Oral  Resp:   22 18  Height:      Weight:    190 lb 4.1 oz (86.3 kg)  SpO2: 94% 99% 96% 95%   Weight change:   Intake/Output Summary (Last 24 hours) at 03/31/14 0716 Last data filed at 03/31/14 0435  Gross per 24 hour  Intake      0 ml  Output    650 ml  Net   -650 ml    Physical Exam: Alert. No distress. Lungs reveal left basilar rales. Heart regular with a mechanical S2. Abdomen is soft and nontender. Extremities reveal no edema.  Lab Results:    Results for orders placed or performed during the hospital encounter of 03/30/14 (from the past 24 hour(s))  Protime-INR     Status: Abnormal   Collection Time: 03/30/14 10:09 AM  Result Value Ref Range   Prothrombin Time 31.4 (H) 11.6 - 15.2 seconds   INR 3.00 (H) 0.00 - 1.49  Comprehensive metabolic panel     Status: Abnormal   Collection Time: 03/30/14 10:18 AM  Result Value Ref Range   Sodium 135 135 - 145 mmol/L   Potassium 4.3 3.5 - 5.1 mmol/L   Chloride 104 96 - 112 mmol/L   CO2 26 19 - 32 mmol/L   Glucose, Bld 82 70 - 99 mg/dL   BUN 22 6 - 23 mg/dL   Creatinine, Ser 1.69 (H) 0.50 - 1.35 mg/dL   Calcium 9.3 8.4 - 10.5 mg/dL   Total Protein 8.0 6.0 - 8.3 g/dL   Albumin 3.8 3.5 - 5.2 g/dL   AST 39 (H) 0 - 37 U/L   ALT 32 0 - 53 U/L   Alkaline Phosphatase 63 39 - 117 U/L   Total Bilirubin 0.6 0.3 - 1.2 mg/dL   GFR calc non Af Amer 40 (L) >90 mL/min   GFR calc Af Amer 47 (L) >90 mL/min   Anion gap 5 5 - 15  Brain  natriuretic peptide     Status: None   Collection Time: 03/30/14 10:18 AM  Result Value Ref Range   B Natriuretic Peptide 37.0 0.0 - 100.0 pg/mL  Troponin I     Status: None   Collection Time: 03/30/14 10:18 AM  Result Value Ref Range   Troponin I <0.03 <0.031 ng/mL  CBC with Differential     Status: Abnormal   Collection Time: 03/30/14 10:18 AM  Result Value Ref Range   WBC 18.6 (H) 4.0 - 10.5 K/uL   RBC 4.50 4.22 - 5.81 MIL/uL   Hemoglobin 12.6 (L) 13.0 - 17.0 g/dL   HCT 38.0 (L) 39.0 - 52.0 %   MCV 84.4 78.0 - 100.0 fL   MCH 28.0 26.0 - 34.0 pg   MCHC 33.2 30.0 - 36.0 g/dL   RDW 13.5 11.5 - 15.5 %   Platelets 292 150 - 400 K/uL   Neutrophils Relative % 80 (H) 43 - 77 %  Neutro Abs 14.9 (H) 1.7 - 7.7 K/uL   Lymphocytes Relative 10 (L) 12 - 46 %   Lymphs Abs 1.8 0.7 - 4.0 K/uL   Monocytes Relative 9 3 - 12 %   Monocytes Absolute 1.7 (H) 0.1 - 1.0 K/uL   Eosinophils Relative 1 0 - 5 %   Eosinophils Absolute 0.1 0.0 - 0.7 K/uL   Basophils Relative 0 0 - 1 %   Basophils Absolute 0.1 0.0 - 0.1 K/uL     ABGS No results for input(s): PHART, PO2ART, TCO2, HCO3 in the last 72 hours.  Invalid input(s): PCO2 CULTURES No results found for this or any previous visit (from the past 240 hour(s)). Studies/Results: Dg Chest 2 View  03/30/2014   CLINICAL DATA:  Three day history of progressively worsening shortness of breath. Leukocytosis with white blood count 17000. Prior aortic valve replacement.  EXAM: CHEST  2 VIEW  COMPARISON:  08/18/2008 dating back to 02/11/2008. CTA chest 07/11/2008.  FINDINGS: Sternotomy for aortic valve replacement. Cardiac silhouette normal in size, unchanged. Thoracic aorta atherosclerotic, unchanged. Hilar and mediastinal contours otherwise unremarkable. Vague approximate 1.5 cm nodular opacity on the PA image, not localized on the lateral. Emphysematous changes throughout both lungs as noted previously. Prominent bronchovascular markings diffusely and mild to  moderate central peribronchial thickening, more so than on the prior examination. Focal airspace opacities in the medial left lower lobe. No pleural effusions. No pneumothorax. Normal pulmonary vascularity. Mild degenerative changes involving the thoracic spine.  IMPRESSION: 1. Vague nodular opacity on the PA image, not localized on the lateral image, not visualized previously. At some point, a non-emergent CT of the chest is recommended, with contrast if the patient has normal renal function. 2. Left lower lobe pneumonia superimposed upon mild to moderate changes of acute bronchitis and/or asthma. These findings are on top of baseline COPD/emphysema.   Electronically Signed   By: Evangeline Dakin M.D.   On: 03/30/2014 11:39   Micro Results: No results found for this or any previous visit (from the past 240 hour(s)). Studies/Results: Dg Chest 2 View  03/30/2014   CLINICAL DATA:  Three day history of progressively worsening shortness of breath. Leukocytosis with white blood count 17000. Prior aortic valve replacement.  EXAM: CHEST  2 VIEW  COMPARISON:  08/18/2008 dating back to 02/11/2008. CTA chest 07/11/2008.  FINDINGS: Sternotomy for aortic valve replacement. Cardiac silhouette normal in size, unchanged. Thoracic aorta atherosclerotic, unchanged. Hilar and mediastinal contours otherwise unremarkable. Vague approximate 1.5 cm nodular opacity on the PA image, not localized on the lateral. Emphysematous changes throughout both lungs as noted previously. Prominent bronchovascular markings diffusely and mild to moderate central peribronchial thickening, more so than on the prior examination. Focal airspace opacities in the medial left lower lobe. No pleural effusions. No pneumothorax. Normal pulmonary vascularity. Mild degenerative changes involving the thoracic spine.  IMPRESSION: 1. Vague nodular opacity on the PA image, not localized on the lateral image, not visualized previously. At some point, a non-emergent  CT of the chest is recommended, with contrast if the patient has normal renal function. 2. Left lower lobe pneumonia superimposed upon mild to moderate changes of acute bronchitis and/or asthma. These findings are on top of baseline COPD/emphysema.   Electronically Signed   By: Evangeline Dakin M.D.   On: 03/30/2014 11:39   Medications:  I have reviewed the patient's current medications Scheduled Meds: . amLODipine  5 mg Oral QHS  . azithromycin  500 mg Intravenous Q24H  . benzonatate  100 mg Oral TID  . cefTRIAXone (ROCEPHIN)  IV  1 g Intravenous Q24H  . metoprolol succinate  25 mg Oral QHS  . Warfarin - Pharmacist Dosing Inpatient   Does not apply Q24H   Continuous Infusions: . 0.9 % NaCl with KCl 20 mEq / L 75 mL/hr at 03/30/14 1801   PRN Meds:.acetaminophen **OR** acetaminophen, albuterol, chlorpheniramine-HYDROcodone, HYDROcodone-acetaminophen, ondansetron **OR** ondansetron (ZOFRAN) IV   Assessment/Plan: #1. Community acquired pneumonia. Continue ceftriaxone and azithromycin. Recheck CBC. #2. Status post aVR. Continue Coumadin. #3. Renal insufficiency. Recheck metabolic profile. Principal Problem:   CAP (community acquired pneumonia) Active Problems:   H/O aortic valve replacement   Long term current use of anticoagulant   Acute bronchitis   Essential hypertension   Acute kidney injury   Lung density on x-ray     LOS: 1 day   Merwyn Hodapp 03/31/2014, 7:16 AM

## 2014-03-31 NOTE — Progress Notes (Signed)
UR chart review completed.  

## 2014-03-31 NOTE — Progress Notes (Signed)
ANTICOAGULATION CONSULT NOTE  Pharmacy Consult for Coumadin Indication: mechanical heart valve  Allergies  Allergen Reactions  . Codeine Nausea And Vomiting and Palpitations    Patient Measurements: Height: 5\' 8"  (172.7 cm) Weight: 190 lb 4.1 oz (86.3 kg) IBW/kg (Calculated) : 68.4  Vital Signs: Temp: 99.1 F (37.3 C) (02/08 0805) Temp Source: Oral (02/08 0805) BP: 110/65 mmHg (02/08 0805) Pulse Rate: 80 (02/08 0805)  Labs:  Recent Labs  03/30/14 1009 03/30/14 1018 03/31/14 0641  HGB  --  12.6* 10.9*  HCT  --  38.0* 33.2*  PLT  --  292 252  LABPROT 31.4*  --  32.5*  INR 3.00*  --  3.13*  CREATININE  --  1.69* 1.61*  TROPONINI  --  <0.03  --     Estimated Creatinine Clearance: 47.6 mL/min (by C-G formula based on Cr of 1.61).   Medical History: Past Medical History  Diagnosis Date  . Unspecified essential hypertension   . Dizziness   . Paraspinal muscle spasm   . Calculus of kidney   . AORTIC VALVE REPLACEMENT, HX OF 07/06/2009    Qualifier: Diagnosis of  By: Purcell Nails, NP, Curt Bears    . Ascending aortic aneurysm 09/16/2011  . Long term current use of anticoagulant 05/30/2010    Medications:  Prescriptions prior to admission  Medication Sig Dispense Refill Last Dose  . amLODipine (NORVASC) 5 MG tablet Take 5 mg by mouth at bedtime.    03/29/2014 at Unknown time  . azithromycin (ZITHROMAX) 250 MG tablet Take 250-500 mg by mouth daily. Take 500 mg on day 1, then take 250 mg daily until gone.   03/30/2014 at Unknown time  . dextromethorphan-guaiFENesin (MUCINEX DM) 30-600 MG per 12 hr tablet Take 1 tablet by mouth 2 (two) times daily as needed for cough.   03/30/2014 at Unknown time  . metoprolol succinate (TOPROL-XL) 25 MG 24 hr tablet Take 25 mg by mouth at bedtime.    03/29/2014 at 1730  . warfarin (COUMADIN) 10 MG tablet Take 1/2 tablet daily or as directed (Patient taking differently: Take 5 mg by mouth daily. Take 1/2 tablet daily or as directed) 90 tablet 3 03/29/2014  at Unknown time    Assessment: 69 yo M on chronic Coumadin for hx MVR.  Home dose listed above.  INR therapeutic on admission.   Patient is admitted for CAP and started on Rocephin/Zithromax.  Zithromax can interact with warfarin to increase INR.   No bleeding noted.   Goal of Therapy:  INR 2.5-3.5   Plan:  Coumadin 5mg  po x1 today (home dose) Daily INR Change Zithromax to PO per P&T policy- see below  Brayleigh Rybacki, Lavonia Drafts 03/31/2014,8:35 AM  PHARMACIST - PHYSICIAN COMMUNICATION CONCERNING: Antibiotic IV to Oral Route Change Policy  RECOMMENDATION: This patient is receiving Zithromax by the intravenous route.  Based on criteria approved by the Pharmacy and Therapeutics Committee, the antibiotic(s) is/are being converted to the equivalent oral dose form(s).   DESCRIPTION: These criteria include:  Patient being treated for a respiratory tract infection, urinary tract infection, cellulitis or clostridium difficile associated diarrhea if on metronidazole  The patient is not neutropenic and does not exhibit a GI malabsorption state  The patient is eating (either orally or via tube) and/or has been taking other orally administered medications for a least 24 hours  The patient is improving clinically and has a Tmax < 100.5  If you have questions about this conversion, please contact the Pharmacy Department  []   (  [phone] [facility] []   [phone] )  [facility]  []   [phone] )  [facility] []   [phone] )  [facility]

## 2014-04-01 LAB — BASIC METABOLIC PANEL
Anion gap: 9 (ref 5–15)
BUN: 21 mg/dL (ref 6–23)
CALCIUM: 8.9 mg/dL (ref 8.4–10.5)
CO2: 25 mmol/L (ref 19–32)
Chloride: 104 mmol/L (ref 96–112)
Creatinine, Ser: 1.69 mg/dL — ABNORMAL HIGH (ref 0.50–1.35)
GFR calc Af Amer: 47 mL/min — ABNORMAL LOW (ref >90)
GFR, EST NON AFRICAN AMERICAN: 40 mL/min — AB (ref >90)
Glucose, Bld: 92 mg/dL (ref 70–99)
Potassium: 4.2 mmol/L (ref 3.5–5.1)
Sodium: 138 mmol/L (ref 135–145)

## 2014-04-01 LAB — CBC
HCT: 35.3 % — ABNORMAL LOW (ref 39.0–52.0)
Hemoglobin: 11.6 g/dL — ABNORMAL LOW (ref 13.0–17.0)
MCH: 27.8 pg (ref 26.0–34.0)
MCHC: 32.9 g/dL (ref 30.0–36.0)
MCV: 84.4 fL (ref 78.0–100.0)
Platelets: 267 10*3/uL (ref 150–400)
RBC: 4.18 MIL/uL — AB (ref 4.22–5.81)
RDW: 13.5 % (ref 11.5–15.5)
WBC: 11.2 10*3/uL — ABNORMAL HIGH (ref 4.0–10.5)

## 2014-04-01 LAB — PROTIME-INR
INR: 2.98 — ABNORMAL HIGH (ref 0.00–1.49)
Prothrombin Time: 31.2 seconds — ABNORMAL HIGH (ref 11.6–15.2)

## 2014-04-01 MED ORDER — LEVOFLOXACIN 500 MG PO TABS: 500.0000 mg | ORAL_TABLET | Freq: Every day | ORAL | Status: DC

## 2014-04-01 NOTE — Progress Notes (Signed)
Patient was discharged home this morning.  Patient was given discharge instructions and prescription.  Patient verbalized understanding with no complaints or concerns voiced at this time.  IV was removed with catheter intact, no bleeding or complications.  Patient left unit, ambulating, in stable condition by staff member.

## 2014-04-01 NOTE — Discharge Summary (Signed)
Physician Discharge Summary  Stephen Moses GQQ:[id number] DOB: Jun 24, 1945 DOA: 03/30/2014   Admit date: 03/30/2014 Discharge date: 04/01/2014  Discharge Diagnoses:  Principal Problem:   CAP (community acquired pneumonia) Active Problems:   H/O aortic valve replacement   Long term current use of anticoagulant   Acute bronchitis   Essential hypertension   Acute kidney injury   Lung density on x-ray    Wt Readings from Last 3 Encounters:  04/01/14 192 lb 9.6 oz (87.363 kg)  11/21/13 197 lb 12 oz (89.699 kg)  10/05/12 181 lb (82.101 kg)     Hospital Course:  This patient is a 69 year old male with a history of aortic valve replacement who presented with cough and congestion. Chest x-ray was consistent with a left lower lobe infiltrate. He was treated with ceftriaxone and azithromycin. He had previously been treated with 1 day of azithromycin after a visit to an urgent care. His white count was 18.6. He was afebrile. Chest x-ray was suggestive of a possible mass. He underwent a CT scan of the chest which revealed no mass. He did have some nonspecific mediastinal lymph nodes and a follow-up CT was recommended at 36 months. His white count improved to 11. He remained afebrile. Shortness of breath resolved. Oxygen saturations are in the mid 90s. He has mild rales in the left base. He is breathing comfortably and is stable for discharge on the morning of February 9. He will be seen in follow-up in my office in one week. He is therapeutically anticoagulated with Coumadin with an INR now 2.9. He will have follow-up in the Coumadin clinic in 2 days. He'll be modified to Levaquin for an additional 8 days of oral antibiotic therapy. Blood cultures are negative. He has had a mild elevation in his creatinine at 1.6. This will be followed up in the office.   Discharge Instructions     Medication List    STOP taking these medications        azithromycin 250 MG tablet  Commonly known as:  ZITHROMAX       TAKE these medications        amLODipine 5 MG tablet  Commonly known as:  NORVASC  Take 5 mg by mouth at bedtime.     dextromethorphan-guaiFENesin 30-600 MG per 12 hr tablet  Commonly known as:  MUCINEX DM  Take 1 tablet by mouth 2 (two) times daily as needed for cough.     levofloxacin 500 MG tablet  Commonly known as:  LEVAQUIN  Take 1 tablet (500 mg total) by mouth daily.     metoprolol succinate 25 MG 24 hr tablet  Commonly known as:  TOPROL-XL  Take 25 mg by mouth at bedtime.     warfarin 10 MG tablet  Commonly known as:  COUMADIN  Take 1/2 tablet daily or as directed         Delmont Prosch 04/01/2014

## 2014-04-03 ENCOUNTER — Ambulatory Visit (INDEPENDENT_AMBULATORY_CARE_PROVIDER_SITE_OTHER): Payer: Medicare Other | Admitting: *Deleted

## 2014-04-03 ENCOUNTER — Encounter: Payer: Self-pay | Admitting: Cardiovascular Disease

## 2014-04-03 ENCOUNTER — Ambulatory Visit (INDEPENDENT_AMBULATORY_CARE_PROVIDER_SITE_OTHER): Payer: Medicare Other | Admitting: Cardiovascular Disease

## 2014-04-03 VITALS — BP 130/90 | HR 99 | Ht 68.0 in | Wt 198.0 lb

## 2014-04-03 DIAGNOSIS — I10 Essential (primary) hypertension: Secondary | ICD-10-CM

## 2014-04-03 DIAGNOSIS — I712 Thoracic aortic aneurysm, without rupture: Secondary | ICD-10-CM | POA: Diagnosis not present

## 2014-04-03 DIAGNOSIS — Z7901 Long term (current) use of anticoagulants: Secondary | ICD-10-CM

## 2014-04-03 DIAGNOSIS — Z954 Presence of other heart-valve replacement: Secondary | ICD-10-CM

## 2014-04-03 DIAGNOSIS — J189 Pneumonia, unspecified organism: Secondary | ICD-10-CM

## 2014-04-03 DIAGNOSIS — Z87898 Personal history of other specified conditions: Secondary | ICD-10-CM

## 2014-04-03 DIAGNOSIS — I359 Nonrheumatic aortic valve disorder, unspecified: Secondary | ICD-10-CM | POA: Diagnosis not present

## 2014-04-03 DIAGNOSIS — I7121 Aneurysm of the ascending aorta, without rupture: Secondary | ICD-10-CM

## 2014-04-03 DIAGNOSIS — Z9289 Personal history of other medical treatment: Secondary | ICD-10-CM

## 2014-04-03 DIAGNOSIS — Z5181 Encounter for therapeutic drug level monitoring: Secondary | ICD-10-CM

## 2014-04-03 DIAGNOSIS — Z952 Presence of prosthetic heart valve: Secondary | ICD-10-CM

## 2014-04-03 LAB — POCT INR: INR: 4.6

## 2014-04-03 NOTE — Progress Notes (Signed)
Patient ID: Stephen Moses, male   DOB: Dec 28, 1945, 69 y.o.   MRN: [id number]      SUBJECTIVE: The patient returns for post-hospitalization follow-up. He has a history of bicuspid aortic valve and ascending aortic aneurysm for which he underwent aneurysm repair and mechanical aortic valve replacement in June 2010. He also has a history of hypertension. He was recently hospitalized for pneumonia and is now on levofloxacin. Chest CT showed moderate centrilobular emphysema, increased number and size of mediastinal nodes which was deemed nonspecific but consideration for a follow-up chest CT in 3-6 months was recommended. Today is his first day out of the house and is here with his daughter. While he feels weak his strength has definitely improved. He denies chest pain and is able to take deep breaths without coughing.     Review of Systems: As per "subjective", otherwise negative.  Allergies  Allergen Reactions  . Codeine Nausea And Vomiting and Palpitations    Current Outpatient Prescriptions  Medication Sig Dispense Refill  . amLODipine (NORVASC) 5 MG tablet Take 5 mg by mouth at bedtime.     [name] levofloxacin (LEVAQUIN) 500 MG tablet Take 1 tablet (500 mg total) by mouth daily. (Patient taking differently: Take 500 mg by mouth daily. Done 04/09/14) 8 tablet 0  . metoprolol succinate (TOPROL-XL) 25 MG 24 hr tablet Take 25 mg by mouth at bedtime.     [name] warfarin (COUMADIN) 10 MG tablet Take 1/2 tablet daily or as directed (Patient taking differently: Take 5 mg by mouth daily. Take 1/2 tablet daily or as directed) 90 tablet 3   No current facility-administered medications for this visit.    Past Medical History  Diagnosis Date  . Unspecified essential hypertension   . Dizziness   . Paraspinal muscle spasm   . Calculus of kidney   . AORTIC VALVE REPLACEMENT, HX OF 07/06/2009    Qualifier: Diagnosis of  By: Purcell Nails, NP, Curt Bears    . Ascending aortic aneurysm 09/16/2011  . Long term  current use of anticoagulant 05/30/2010    Past Surgical History  Procedure Laterality Date  . Right shoulder surgery    . Back surgery  2009  . Colonoscopy    . Cardiac valve replacement    . Eye surgery      History   Social History  . Marital Status: Married    Spouse Name: N/A  . Number of Children: N/A  . Years of Education: N/A   Occupational History  . Not on file.   Social History Main Topics  . Smoking status: Former Smoker    Types: Cigarettes    Start date: 11/22/1958    Quit date: 11/21/1993  . Smokeless tobacco: Never Used  . Alcohol Use: No  . Drug Use: No  . Sexual Activity: Not on file   Other Topics Concern  . Not on file   Social History Narrative     Filed Vitals:   04/03/14 1525  BP: 130/90  Pulse: 99  Height: 5\' 8"  (1.727 m)  Weight: 198 lb (89.812 kg)  SpO2: 95%    PHYSICAL EXAM General: NAD HEENT: Normal. Neck: No JVD, no thyromegaly. Lungs: Clear to auscultation bilaterally with normal respiratory effort. CV: Nondisplaced PMI. Regular rate and rhythm, normal N8/G9 click, no F6/O1, soft I/VI systolic murmur over RUSB, no diastolic murmur. No pretibial or periankle edema. No carotid bruit. Normal pedal pulses.  Abdomen: Soft, nontender, no hepatosplenomegaly, no distention.  Neurologic: Alert and oriented x 3.  Psych: Normal affect. Skin: Normal. Musculoskeletal: Normal range of motion, no gross deformities. Extremities: No clubbing or cyanosis. .   ECG: Most recent ECG reviewed.      ASSESSMENT AND PLAN: 1. Bicuspid AV and ascending aortic aneurysm s/p mechanical AVR and aneurysm repair in June 2010: Stable and asymptomatic. No diastolic murmur appreciated, indicating normal valve function. Will have INR checked today as he is on levofloxacin. Continue warfarin. Would consider echocardiogram at a later date this year. 2. Essential HTN: Controlled on amlodipine 5 mg and Toprol-XL 25 mg daily. He regularly monitors his  blood pressure at home and it has remained within normal limits. 3. Community acquired pneumonia: Continue levofloxacin.  Dispo: f/u 3-4 months.   [name], M.D., F.A.C.C.

## 2014-04-03 NOTE — Patient Instructions (Signed)
Your physician recommends that you schedule a follow-up appointment in: 3-4 months. Your physician recommends that you continue on your current medications as directed. Please refer to the Current Medication list given to you today. Please have your INR checked today.

## 2014-04-08 DIAGNOSIS — J189 Pneumonia, unspecified organism: Secondary | ICD-10-CM | POA: Diagnosis not present

## 2014-04-14 ENCOUNTER — Ambulatory Visit (INDEPENDENT_AMBULATORY_CARE_PROVIDER_SITE_OTHER): Payer: Medicare Other | Admitting: *Deleted

## 2014-04-14 DIAGNOSIS — I359 Nonrheumatic aortic valve disorder, unspecified: Secondary | ICD-10-CM | POA: Diagnosis not present

## 2014-04-14 DIAGNOSIS — Z7901 Long term (current) use of anticoagulants: Secondary | ICD-10-CM

## 2014-04-14 DIAGNOSIS — Z5181 Encounter for therapeutic drug level monitoring: Secondary | ICD-10-CM

## 2014-04-14 DIAGNOSIS — Z954 Presence of other heart-valve replacement: Secondary | ICD-10-CM | POA: Diagnosis not present

## 2014-04-14 DIAGNOSIS — Z952 Presence of prosthetic heart valve: Secondary | ICD-10-CM

## 2014-04-14 LAB — POCT INR: INR: 3

## 2014-05-12 ENCOUNTER — Ambulatory Visit (INDEPENDENT_AMBULATORY_CARE_PROVIDER_SITE_OTHER): Payer: Medicare Other | Admitting: *Deleted

## 2014-05-12 DIAGNOSIS — I359 Nonrheumatic aortic valve disorder, unspecified: Secondary | ICD-10-CM

## 2014-05-12 DIAGNOSIS — Z5181 Encounter for therapeutic drug level monitoring: Secondary | ICD-10-CM | POA: Diagnosis not present

## 2014-05-12 DIAGNOSIS — Z7901 Long term (current) use of anticoagulants: Secondary | ICD-10-CM | POA: Diagnosis not present

## 2014-05-12 DIAGNOSIS — Z952 Presence of prosthetic heart valve: Secondary | ICD-10-CM

## 2014-05-12 DIAGNOSIS — Z954 Presence of other heart-valve replacement: Secondary | ICD-10-CM | POA: Diagnosis not present

## 2014-05-12 LAB — POCT INR: INR: 4.2

## 2014-05-16 DIAGNOSIS — J209 Acute bronchitis, unspecified: Secondary | ICD-10-CM | POA: Diagnosis not present

## 2014-05-23 DIAGNOSIS — I480 Paroxysmal atrial fibrillation: Secondary | ICD-10-CM | POA: Diagnosis not present

## 2014-05-23 DIAGNOSIS — Z125 Encounter for screening for malignant neoplasm of prostate: Secondary | ICD-10-CM | POA: Diagnosis not present

## 2014-05-23 DIAGNOSIS — Z79899 Other long term (current) drug therapy: Secondary | ICD-10-CM | POA: Diagnosis not present

## 2014-05-23 DIAGNOSIS — R7301 Impaired fasting glucose: Secondary | ICD-10-CM | POA: Diagnosis not present

## 2014-05-30 ENCOUNTER — Other Ambulatory Visit (HOSPITAL_COMMUNITY): Payer: Self-pay | Admitting: Internal Medicine

## 2014-05-30 DIAGNOSIS — N183 Chronic kidney disease, stage 3 (moderate): Secondary | ICD-10-CM | POA: Diagnosis not present

## 2014-05-30 DIAGNOSIS — I10 Essential (primary) hypertension: Secondary | ICD-10-CM | POA: Diagnosis not present

## 2014-05-30 DIAGNOSIS — I712 Thoracic aortic aneurysm, without rupture: Secondary | ICD-10-CM | POA: Diagnosis not present

## 2014-05-30 DIAGNOSIS — R0602 Shortness of breath: Secondary | ICD-10-CM

## 2014-05-30 DIAGNOSIS — J189 Pneumonia, unspecified organism: Secondary | ICD-10-CM | POA: Diagnosis not present

## 2014-06-02 ENCOUNTER — Ambulatory Visit (INDEPENDENT_AMBULATORY_CARE_PROVIDER_SITE_OTHER): Payer: Medicare Other | Admitting: *Deleted

## 2014-06-02 DIAGNOSIS — I359 Nonrheumatic aortic valve disorder, unspecified: Secondary | ICD-10-CM | POA: Diagnosis not present

## 2014-06-02 DIAGNOSIS — Z952 Presence of prosthetic heart valve: Secondary | ICD-10-CM

## 2014-06-02 DIAGNOSIS — Z954 Presence of other heart-valve replacement: Secondary | ICD-10-CM

## 2014-06-02 DIAGNOSIS — Z5181 Encounter for therapeutic drug level monitoring: Secondary | ICD-10-CM | POA: Diagnosis not present

## 2014-06-02 DIAGNOSIS — Z7901 Long term (current) use of anticoagulants: Secondary | ICD-10-CM

## 2014-06-02 LAB — POCT INR: INR: 2.5

## 2014-06-03 ENCOUNTER — Ambulatory Visit (HOSPITAL_COMMUNITY)
Admission: RE | Admit: 2014-06-03 | Discharge: 2014-06-03 | Disposition: A | Discharge status: Home / Self Care | Payer: Medicare Other | Source: Ambulatory Visit | Attending: Internal Medicine | Admitting: Internal Medicine

## 2014-06-03 DIAGNOSIS — J984 Other disorders of lung: Secondary | ICD-10-CM | POA: Diagnosis not present

## 2014-06-03 DIAGNOSIS — R05 Cough: Secondary | ICD-10-CM | POA: Diagnosis not present

## 2014-06-03 DIAGNOSIS — J439 Emphysema, unspecified: Secondary | ICD-10-CM | POA: Diagnosis not present

## 2014-06-03 DIAGNOSIS — Z952 Presence of prosthetic heart valve: Secondary | ICD-10-CM | POA: Diagnosis not present

## 2014-06-03 DIAGNOSIS — R062 Wheezing: Secondary | ICD-10-CM | POA: Insufficient documentation

## 2014-06-03 DIAGNOSIS — R0602 Shortness of breath: Secondary | ICD-10-CM

## 2014-06-18 ENCOUNTER — Encounter: Payer: Self-pay | Admitting: Cardiovascular Disease

## 2014-06-23 ENCOUNTER — Telehealth: Payer: Self-pay | Admitting: *Deleted

## 2014-06-23 DIAGNOSIS — J449 Chronic obstructive pulmonary disease, unspecified: Secondary | ICD-10-CM | POA: Diagnosis not present

## 2014-06-23 DIAGNOSIS — R05 Cough: Secondary | ICD-10-CM | POA: Diagnosis not present

## 2014-06-23 NOTE — Telephone Encounter (Signed)
Saw Dr Willey Blade this morning.  Was started on Levaquin 500mg  daily x 10 days and prednisone 10mg  taper (40mg  x 3, 30mg  x 3, 20mg  x 3, 10mg  x 3) for chest congestion.   Will finish levaquin 5/11 and prednisone 5/13.  Pt is taking coumadin 5mg  daily.  Told him to skip coumadin on Wednesday and Saturday due to Abx and Steroids.  He is scheduled for INR check on 06/30/14

## 2014-06-30 ENCOUNTER — Ambulatory Visit (INDEPENDENT_AMBULATORY_CARE_PROVIDER_SITE_OTHER): Payer: Medicare Other | Admitting: *Deleted

## 2014-06-30 DIAGNOSIS — I359 Nonrheumatic aortic valve disorder, unspecified: Secondary | ICD-10-CM | POA: Diagnosis not present

## 2014-06-30 DIAGNOSIS — Z5181 Encounter for therapeutic drug level monitoring: Secondary | ICD-10-CM

## 2014-06-30 DIAGNOSIS — Z7901 Long term (current) use of anticoagulants: Secondary | ICD-10-CM | POA: Diagnosis not present

## 2014-06-30 DIAGNOSIS — Z954 Presence of other heart-valve replacement: Secondary | ICD-10-CM | POA: Diagnosis not present

## 2014-06-30 DIAGNOSIS — Z952 Presence of prosthetic heart valve: Secondary | ICD-10-CM

## 2014-06-30 LAB — POCT INR: INR: 2.5

## 2014-07-07 DIAGNOSIS — R05 Cough: Secondary | ICD-10-CM | POA: Diagnosis not present

## 2014-07-08 ENCOUNTER — Encounter (INDEPENDENT_AMBULATORY_CARE_PROVIDER_SITE_OTHER): Payer: Self-pay | Admitting: *Deleted

## 2014-07-10 ENCOUNTER — Ambulatory Visit: Payer: Self-pay | Admitting: Cardiovascular Disease

## 2014-07-16 ENCOUNTER — Ambulatory Visit (INDEPENDENT_AMBULATORY_CARE_PROVIDER_SITE_OTHER): Payer: Medicare Other | Admitting: Cardiovascular Disease

## 2014-07-16 ENCOUNTER — Encounter (INDEPENDENT_AMBULATORY_CARE_PROVIDER_SITE_OTHER): Payer: Self-pay | Admitting: *Deleted

## 2014-07-16 ENCOUNTER — Encounter: Payer: Self-pay | Admitting: Cardiovascular Disease

## 2014-07-16 ENCOUNTER — Other Ambulatory Visit (INDEPENDENT_AMBULATORY_CARE_PROVIDER_SITE_OTHER): Payer: Self-pay | Admitting: *Deleted

## 2014-07-16 VITALS — BP 115/72 | HR 78 | Ht 68.0 in | Wt 194.0 lb

## 2014-07-16 DIAGNOSIS — Z7901 Long term (current) use of anticoagulants: Secondary | ICD-10-CM | POA: Diagnosis not present

## 2014-07-16 DIAGNOSIS — Z9889 Other specified postprocedural states: Secondary | ICD-10-CM

## 2014-07-16 DIAGNOSIS — Z800 Family history of malignant neoplasm of digestive organs: Secondary | ICD-10-CM

## 2014-07-16 DIAGNOSIS — I10 Essential (primary) hypertension: Secondary | ICD-10-CM | POA: Diagnosis not present

## 2014-07-16 DIAGNOSIS — Z1211 Encounter for screening for malignant neoplasm of colon: Secondary | ICD-10-CM

## 2014-07-16 DIAGNOSIS — Z952 Presence of prosthetic heart valve: Secondary | ICD-10-CM

## 2014-07-16 DIAGNOSIS — Z954 Presence of other heart-valve replacement: Secondary | ICD-10-CM

## 2014-07-16 DIAGNOSIS — Z8679 Personal history of other diseases of the circulatory system: Secondary | ICD-10-CM

## 2014-07-16 NOTE — Patient Instructions (Signed)
Your physician wants you to follow-up in: 1 year with [name] You will receive a reminder letter in the mail two months in advance. If you don't receive a letter, please call our office to schedule the follow-up appointment.  Your physician recommends that you continue on your current medications as directed. Please refer to the Current Medication list given to you today.  Thank you for choosing [facility]!!

## 2014-07-16 NOTE — Progress Notes (Signed)
Patient ID: Stephen Moses, male   DOB: [birth year], 69 y.o.   MRN: [id number]      SUBJECTIVE: The patient returns for routine follow-up. He has a history of bicuspid aortic valve and ascending aortic aneurysm for which he underwent aneurysm repair and mechanical aortic valve replacement in June 2010. He also has a history of hypertension.  He has been dealing with seasonal allergies. Otherwise, he stays very active and walks 1 mile every other day, lifts weights, mows his lawn, and does weeding. He denies chest pain, palpitations, shortness of breath, and leg swelling.  Review of Systems: As per "subjective", otherwise negative.  Allergies  Allergen Reactions  . Codeine Nausea And Vomiting and Palpitations    Current Outpatient Prescriptions  Medication Sig Dispense Refill  . amLODipine (NORVASC) 5 MG tablet Take 5 mg by mouth at bedtime.     . cetirizine (ZYRTEC) 10 MG tablet Take 10 mg by mouth daily.    . metoprolol succinate (TOPROL-XL) 25 MG 24 hr tablet Take 25 mg by mouth at bedtime.     [name] warfarin (COUMADIN) 10 MG tablet Take 1/2 tablet daily or as directed (Patient taking differently: Take 5 mg by mouth daily. Take 1/2 tablet daily or as directed) 90 tablet 3   No current facility-administered medications for this visit.    Past Medical History  Diagnosis Date  . Unspecified essential hypertension   . Dizziness   . Paraspinal muscle spasm   . Calculus of kidney   . AORTIC VALVE REPLACEMENT, HX OF 07/06/2009    Qualifier: Diagnosis of  By: Purcell Nails, NP, Curt Bears    . Ascending aortic aneurysm 09/16/2011  . Long term current use of anticoagulant 05/30/2010    Past Surgical History  Procedure Laterality Date  . Right shoulder surgery    . Back surgery  2009  . Colonoscopy    . Cardiac valve replacement    . Eye surgery      History   Social History  . Marital Status: Married    Spouse Name: N/A  . Number of Children: N/A  . Years of Education: N/A    Occupational History  . Not on file.   Social History Main Topics  . Smoking status: Former Smoker -- 2.50 packs/day for 35 years    Types: Cigarettes    Start date: 11/22/1958    Quit date: 11/21/1993  . Smokeless tobacco: Never Used  . Alcohol Use: No  . Drug Use: No  . Sexual Activity: Not on file   Other Topics Concern  . Not on file   Social History Narrative     Filed Vitals:   07/16/14 0916  BP: 115/72  Pulse: 78  Height: 5\' 8"  (1.727 m)  Weight: 194 lb (87.998 kg)    PHYSICAL EXAM General: NAD HEENT: Normal. Neck: No JVD, no thyromegaly. Lungs: Clear to auscultation bilaterally with normal respiratory effort. CV: Nondisplaced PMI. Regular rate and rhythm, normal V4/Q5 click, no Z5/G3, soft I/VI systolic murmur over RUSB, no diastolic murmur. No pretibial or periankle edema. No carotid bruit.   Abdomen: Soft, no distention.  Neurologic: Alert and oriented x 3.  Psych: Normal affect. Skin: Normal. Musculoskeletal: Normal range of motion, no gross deformities. Extremities: No clubbing or cyanosis.  ECG: Most recent ECG reviewed.      ASSESSMENT AND PLAN: 1. Bicuspid AV and ascending aortic aneurysm s/p mechanical AVR and aneurysm repair in June 2010: Stable and asymptomatic. No diastolic murmur appreciated, indicating normal  valve function. Continue warfarin. INR 2.5 on 06/30/14.  2. Essential HTN: Controlled on amlodipine 5 mg and Toprol-XL 25 mg daily. He regularly monitors his blood pressure at home and it has remained within normal limits.   Dispo: f/u 1 year.  [name], M.D., F.A.C.C.

## 2014-07-28 ENCOUNTER — Ambulatory Visit (INDEPENDENT_AMBULATORY_CARE_PROVIDER_SITE_OTHER): Payer: Medicare Other | Admitting: *Deleted

## 2014-07-28 DIAGNOSIS — Z5181 Encounter for therapeutic drug level monitoring: Secondary | ICD-10-CM | POA: Diagnosis not present

## 2014-07-28 DIAGNOSIS — I359 Nonrheumatic aortic valve disorder, unspecified: Secondary | ICD-10-CM

## 2014-07-28 DIAGNOSIS — Z7901 Long term (current) use of anticoagulants: Secondary | ICD-10-CM | POA: Diagnosis not present

## 2014-07-28 DIAGNOSIS — Z954 Presence of other heart-valve replacement: Secondary | ICD-10-CM

## 2014-07-28 DIAGNOSIS — D485 Neoplasm of uncertain behavior of skin: Secondary | ICD-10-CM | POA: Diagnosis not present

## 2014-07-28 DIAGNOSIS — C44311 Basal cell carcinoma of skin of nose: Secondary | ICD-10-CM | POA: Diagnosis not present

## 2014-07-28 DIAGNOSIS — Z952 Presence of prosthetic heart valve: Secondary | ICD-10-CM

## 2014-07-28 DIAGNOSIS — L570 Actinic keratosis: Secondary | ICD-10-CM | POA: Diagnosis not present

## 2014-07-28 DIAGNOSIS — Z85828 Personal history of other malignant neoplasm of skin: Secondary | ICD-10-CM | POA: Diagnosis not present

## 2014-07-28 LAB — POCT INR: INR: 3

## 2014-08-19 ENCOUNTER — Telehealth (INDEPENDENT_AMBULATORY_CARE_PROVIDER_SITE_OTHER): Payer: Self-pay | Admitting: *Deleted

## 2014-08-19 NOTE — Telephone Encounter (Signed)
Patient needs trilyte 

## 2014-08-19 NOTE — Telephone Encounter (Signed)
Patient is scheduled for colonoscopy 10/16/14, needs to stop Coumadin 5 days prior, is this ok, please advise, thanks

## 2014-08-20 MED ORDER — PEG 3350-KCL-NA BICARB-NACL 420 G PO SOLR: 4000.0000 mL | Freq: Once | ORAL | Status: DC

## 2014-08-20 NOTE — Telephone Encounter (Signed)
Gay Filler, [place] to hold without bridging?  H/O mech AVR Not in Atrial fib CHADS2 = 1  (HTN)

## 2014-08-21 DIAGNOSIS — C44111 Basal cell carcinoma of skin of unspecified eyelid, including canthus: Secondary | ICD-10-CM | POA: Diagnosis not present

## 2014-08-21 NOTE — Telephone Encounter (Signed)
Ok to hold with no bridge.

## 2014-08-22 NOTE — Telephone Encounter (Signed)
Patient aware.

## 2014-08-27 ENCOUNTER — Ambulatory Visit (INDEPENDENT_AMBULATORY_CARE_PROVIDER_SITE_OTHER): Payer: Medicare Other | Admitting: *Deleted

## 2014-08-27 DIAGNOSIS — Z5181 Encounter for therapeutic drug level monitoring: Secondary | ICD-10-CM

## 2014-08-27 DIAGNOSIS — I359 Nonrheumatic aortic valve disorder, unspecified: Secondary | ICD-10-CM | POA: Diagnosis not present

## 2014-08-27 DIAGNOSIS — Z952 Presence of prosthetic heart valve: Secondary | ICD-10-CM

## 2014-08-27 DIAGNOSIS — Z7901 Long term (current) use of anticoagulants: Secondary | ICD-10-CM | POA: Diagnosis not present

## 2014-08-27 DIAGNOSIS — Z954 Presence of other heart-valve replacement: Secondary | ICD-10-CM | POA: Diagnosis not present

## 2014-08-27 LAB — POCT INR: INR: 3.1

## 2014-09-18 ENCOUNTER — Telehealth (INDEPENDENT_AMBULATORY_CARE_PROVIDER_SITE_OTHER): Payer: Self-pay | Admitting: *Deleted

## 2014-09-18 NOTE — Telephone Encounter (Signed)
Referring MD/PCP: fagan   Procedure: tcs  Reason/Indication:  Screening, fam hx colon ca  Has patient had this procedure before?  Yes, 2006 -- epic  If so, when, by whom and where?    Is there a family history of colon cancer?  Yes, brother  Who?  What age when diagnosed?    Is patient diabetic?   no      Does patient have prosthetic heart valve?  Yes, mechanical valve  Do you have a pacemaker?  no  Has patient ever had endocarditis? no  Has patient had joint replacement within last 12 months?  no  Does patient tend to be constipated or take laxatives? no  Is patient on Coumadin, Plavix and/or Aspirin? yes  Medications: coumadin 10 mg daily, metoprolol 25 mg daily, amlodipine 5 mg daily, cetirizine daily  Allergies: codeine  Medication Adjustment: coumadin 5 days  Procedure date & time: 10/16/14 at 930

## 2014-09-18 NOTE — Telephone Encounter (Signed)
agree

## 2014-10-08 ENCOUNTER — Ambulatory Visit (INDEPENDENT_AMBULATORY_CARE_PROVIDER_SITE_OTHER): Payer: Medicare Other | Admitting: *Deleted

## 2014-10-08 DIAGNOSIS — Z5181 Encounter for therapeutic drug level monitoring: Secondary | ICD-10-CM | POA: Diagnosis not present

## 2014-10-08 DIAGNOSIS — Z952 Presence of prosthetic heart valve: Secondary | ICD-10-CM

## 2014-10-08 DIAGNOSIS — I359 Nonrheumatic aortic valve disorder, unspecified: Secondary | ICD-10-CM

## 2014-10-08 DIAGNOSIS — Z954 Presence of other heart-valve replacement: Secondary | ICD-10-CM | POA: Diagnosis not present

## 2014-10-08 DIAGNOSIS — Z7901 Long term (current) use of anticoagulants: Secondary | ICD-10-CM | POA: Diagnosis not present

## 2014-10-08 LAB — POCT INR: INR: 3.4

## 2014-10-16 ENCOUNTER — Encounter (HOSPITAL_COMMUNITY)
Admission: RE | Disposition: A | Discharge status: Home / Self Care | Payer: Self-pay | Source: Ambulatory Visit | Attending: Internal Medicine

## 2014-10-16 ENCOUNTER — Encounter (HOSPITAL_COMMUNITY): Payer: Self-pay | Admitting: *Deleted

## 2014-10-16 ENCOUNTER — Ambulatory Visit (HOSPITAL_COMMUNITY)
Admission: RE | Admit: 2014-10-16 | Discharge: 2014-10-16 | Disposition: A | Discharge status: Home / Self Care | Payer: Medicare Other | Source: Ambulatory Visit | Attending: Internal Medicine | Admitting: Internal Medicine

## 2014-10-16 DIAGNOSIS — Z952 Presence of prosthetic heart valve: Secondary | ICD-10-CM | POA: Insufficient documentation

## 2014-10-16 DIAGNOSIS — Z79899 Other long term (current) drug therapy: Secondary | ICD-10-CM | POA: Diagnosis not present

## 2014-10-16 DIAGNOSIS — D123 Benign neoplasm of transverse colon: Secondary | ICD-10-CM | POA: Diagnosis not present

## 2014-10-16 DIAGNOSIS — Z7901 Long term (current) use of anticoagulants: Secondary | ICD-10-CM | POA: Insufficient documentation

## 2014-10-16 DIAGNOSIS — K649 Unspecified hemorrhoids: Secondary | ICD-10-CM | POA: Diagnosis not present

## 2014-10-16 DIAGNOSIS — Z1211 Encounter for screening for malignant neoplasm of colon: Secondary | ICD-10-CM

## 2014-10-16 DIAGNOSIS — Z801 Family history of malignant neoplasm of trachea, bronchus and lung: Secondary | ICD-10-CM | POA: Insufficient documentation

## 2014-10-16 DIAGNOSIS — K5790 Diverticulosis of intestine, part unspecified, without perforation or abscess without bleeding: Secondary | ICD-10-CM | POA: Diagnosis not present

## 2014-10-16 DIAGNOSIS — K648 Other hemorrhoids: Secondary | ICD-10-CM

## 2014-10-16 DIAGNOSIS — I10 Essential (primary) hypertension: Secondary | ICD-10-CM | POA: Insufficient documentation

## 2014-10-16 DIAGNOSIS — Z800 Family history of malignant neoplasm of digestive organs: Secondary | ICD-10-CM | POA: Diagnosis not present

## 2014-10-16 DIAGNOSIS — K5730 Diverticulosis of large intestine without perforation or abscess without bleeding: Secondary | ICD-10-CM | POA: Diagnosis not present

## 2014-10-16 HISTORY — PX: COLONOSCOPY: SHX5424

## 2014-10-16 SURGERY — COLONOSCOPY
Anesthesia: Moderate Sedation

## 2014-10-16 MED ORDER — SODIUM CHLORIDE 0.9 % IV SOLN
INTRAVENOUS | Status: DC
  Administered 2014-10-16: 09:00:00 via INTRAVENOUS

## 2014-10-16 MED ORDER — CEFAZOLIN SODIUM-DEXTROSE 2-3 GM-% IV SOLR: 2.0000 g | Freq: Once | INTRAVENOUS | Status: DC

## 2014-10-16 MED ORDER — DEXTROSE 5 % IV SOLN: 2.0000 g | Freq: Once | INTRAVENOUS | Status: DC

## 2014-10-16 MED ORDER — MIDAZOLAM HCL 5 MG/5ML IJ SOLN
INTRAMUSCULAR | Status: AC
  Filled 2014-10-16: qty 10

## 2014-10-16 MED ORDER — STERILE WATER FOR IRRIGATION IR SOLN
Status: DC | PRN
  Administered 2014-10-16: 10:00:00

## 2014-10-16 MED ORDER — MEPERIDINE HCL 50 MG/ML IJ SOLN
INTRAMUSCULAR | Status: AC
  Filled 2014-10-16: qty 1

## 2014-10-16 MED ORDER — CEFAZOLIN SODIUM-DEXTROSE 2-3 GM-% IV SOLR
2.0000 g | Freq: Once | INTRAVENOUS | Status: DC
  Administered 2014-10-16: 2 g via INTRAVENOUS

## 2014-10-16 MED ORDER — CEFAZOLIN SODIUM-DEXTROSE 2-3 GM-% IV SOLR
INTRAVENOUS | Status: AC
  Filled 2014-10-16: qty 50

## 2014-10-16 MED ORDER — MIDAZOLAM HCL 5 MG/5ML IJ SOLN
INTRAMUSCULAR | Status: DC | PRN
  Administered 2014-10-16: 1 mg via INTRAVENOUS
  Administered 2014-10-16: 2 mg via INTRAVENOUS
  Administered 2014-10-16: 1 mg via INTRAVENOUS

## 2014-10-16 MED ORDER — MEPERIDINE HCL 50 MG/ML IJ SOLN
INTRAMUSCULAR | Status: DC | PRN
  Administered 2014-10-16 (×2): 25 mg via INTRAVENOUS

## 2014-10-16 NOTE — Op Note (Signed)
COLONOSCOPY PROCEDURE REPORT  PATIENT:  TESLA BOCHICCHIO  MR#:  [id number] Birthdate:  1945/12/21, 69 y.o., male Endoscopist:  Dr. Rogene Houston, MD Referred By:  Dr. [name], MD  Procedure Date: 10/16/2014  Procedure:   Colonoscopy  Indications:  Patient is 69 year old Caucasian male who is undergoing high risk screening colonoscopy. Last exam was about 5 years ago and was negative for colonic polyps. Family history significant for CRC inbrother who died of metastatic disease within few months of diagnosis at age 71.  Informed Consent:  The procedure and risks were reviewed with the patient and informed consent was obtained.  Medications:  Demerol 50 mg IV Versed 4 mg IV  Description of procedure:  After a digital rectal exam was performed, that colonoscope was advanced from the anus through the rectum and colon to the area of the cecum, ileocecal valve and appendiceal orifice. The cecum was deeply intubated. These structures were well-seen and photographed for the record. From the level of the cecum and ileocecal valve, the scope was slowly and cautiously withdrawn. The mucosal surfaces were carefully surveyed utilizing scope tip to flexion to facilitate fold flattening as needed. The scope was pulled down into the rectum where a thorough exam including retroflexion was performed.  Findings:   Prep satisfactory. 5 mm polyp cold snared from transverse colon. Another small polyp was ablated via cold biopsy from transverse colon. Both of these polyps were submitted together. Scattered diverticula at sigmoid colon. Small hemorrhoids above and below the dentate line.    Therapeutic/Diagnostic Maneuvers Performed:  See above  Complications:  None  Cecal Withdrawal Time:  12 minutes  Impression:  Examination performed to cecum. Two small polyps removed from transverse colon and submitted together(old snare and cold biopsy). Mild sigmoid colon diverticulosis. Small internal and  external hemorrhoids.  Recommendations:  Standard instructions given. Patient will resume usual medications including warfarin. Will have INR checked in 7-10 days. I will contact patient with biopsy results and further recommendations.  REHMAN,NAJEEB U  10/16/2014 10:05 AM  CC: Dr. [name], MD & Dr. Rayne Du ref. provider found

## 2014-10-16 NOTE — Discharge Instructions (Signed)
Resume usual medications including warfarin at usual dose. High fiber diet. No driving for 24 hours.  INR in 7-10 days. Physician will call with biopsy results  Colonoscopy, Care After These instructions give you information on caring for yourself after your procedure. Your doctor may also give you more specific instructions. Call your doctor if you have any problems or questions after your procedure. HOME CARE  Do not drive for 24 hours.  Do not sign important papers or use machinery for 24 hours.  You may shower.  You may go back to your usual activities, but go slower for the first 24 hours.  Take rest breaks often during the first 24 hours.  Walk around or use warm packs on your belly (abdomen) if you have belly cramping or gas.  Drink enough fluids to keep your pee (urine) clear or pale yellow.  Resume your normal diet. Avoid heavy or fried foods.  Avoid drinking alcohol for 24 hours or as told by your doctor.  Only take medicines as told by your doctor. If a tissue sample (biopsy) was taken during the procedure:   Do not take aspirin or blood thinners for 7 days, or as told by your doctor.  Do not drink alcohol for 7 days, or as told by your doctor.  Eat soft foods for the first 24 hours. GET HELP IF: You still have a small amount of blood in your poop (stool) 2-3 days after the procedure. GET HELP RIGHT AWAY IF:  You have more than a small amount of blood in your poop.  You see clumps of tissue (blood clots) in your poop.  Your belly is puffy (swollen).  You feel sick to your stomach (nauseous) or throw up (vomit).  You have a fever.  You have belly pain that gets worse and medicine does not help. MAKE SURE YOU:  Understand these instructions.  Will watch your condition.  Will get help right away if you are not doing well or get worse. Document Released: 03/12/2010 Document Revised: 02/12/2013 Document Reviewed: 10/15/2012 [facility] Patient  Information 2015 [place], [place]. This information is not intended to replace advice given to you by your health care provider. Make sure you discuss any questions you have with your health care provider.   High-Fiber Diet Fiber is found in fruits, vegetables, and grains. A high-fiber diet encourages the addition of more whole grains, legumes, fruits, and vegetables in your diet. The recommended amount of fiber for adult males is 38 g per day. For adult females, it is 25 g per day. Pregnant and lactating women should get 28 g of fiber per day. If you have a digestive or bowel problem, ask your caregiver for advice before adding high-fiber foods to your diet. Eat a variety of high-fiber foods instead of only a select few type of foods.  PURPOSE  To increase stool bulk.  To make bowel movements more regular to prevent constipation.  To lower cholesterol.  To prevent overeating. WHEN IS THIS DIET USED?  It may be used if you have constipation and hemorrhoids.  It may be used if you have uncomplicated diverticulosis (intestine condition) and irritable bowel syndrome.  It may be used if you need help with weight management.  It may be used if you want to add it to your diet as a protective measure against atherosclerosis, diabetes, and cancer. SOURCES OF FIBER  Whole-grain breads and cereals.  Fruits, such as apples, oranges, bananas, berries, prunes, and pears.  Vegetables, such  as green peas, carrots, sweet potatoes, beets, broccoli, cabbage, spinach, and artichokes.  Legumes, such split peas, soy, lentils.  Almonds. FIBER CONTENT IN FOODS Starches and Grains / Dietary Fiber (g)  Cheerios, 1 cup / 3 g  Corn Flakes cereal, 1 cup / 0.7 g  Rice crispy treat cereal, 1 cup / 0.3 g  Instant oatmeal (cooked),  cup / 2 g  Frosted wheat cereal, 1 cup / 5.1 g  Brown, long-grain rice (cooked), 1 cup / 3.5 g  White, long-grain rice (cooked), 1 cup / 0.6 g  Enriched macaroni  (cooked), 1 cup / 2.5 g Legumes / Dietary Fiber (g)  Baked beans (canned, plain, or vegetarian),  cup / 5.2 g  Kidney beans (canned),  cup / 6.8 g  Pinto beans (cooked),  cup / 5.5 g Breads and Crackers / Dietary Fiber (g)  Plain or honey graham crackers, 2 squares / 0.7 g  Saltine crackers, 3 squares / 0.3 g  Plain, salted pretzels, 10 pieces / 1.8 g  Whole-wheat bread, 1 slice / 1.9 g  White bread, 1 slice / 0.7 g  Raisin bread, 1 slice / 1.2 g  Plain bagel, 3 oz / 2 g  Flour tortilla, 1 oz / 0.9 g  Corn tortilla, 1 small / 1.5 g  Hamburger or hotdog bun, 1 small / 0.9 g Fruits / Dietary Fiber (g)  Apple with skin, 1 medium / 4.4 g  Sweetened applesauce,  cup / 1.5 g  Banana,  medium / 1.5 g  Grapes, 10 grapes / 0.4 g  Orange, 1 small / 2.3 g  Raisin, 1.5 oz / 1.6 g  Melon, 1 cup / 1.4 g Vegetables / Dietary Fiber (g)  Green beans (canned),  cup / 1.3 g  Carrots (cooked),  cup / 2.3 g  Broccoli (cooked),  cup / 2.8 g  Peas (cooked),  cup / 4.4 g  Mashed potatoes,  cup / 1.6 g  Lettuce, 1 cup / 0.5 g  Corn (canned),  cup / 1.6 g  Tomato,  cup / 1.1 g Document Released: 02/07/2005 Document Revised: 08/09/2011 Document Reviewed: 05/12/2011 [employer] Patient Information 2015 [place], [place]. This information is not intended to replace advice given to you by your health care provider. Make sure you discuss any questions you have with your health care provider.

## 2014-10-16 NOTE — H&P (Signed)
Stephen Moses is an 69 y.o. male.   Chief Complaint: Patient is here for colonoscopy. HPI: 69 year old Caucasian male who is here for screening colonoscopy. He denies abdominal pain change in bowel habits or rectal bleeding. He has been off warfarin for 5 days. Last exam was 5 years ago and was normal. Family history significant for CRC in brother who was 45 or 31 at the time of diagnosis and died within few months of metastatic disease.  Past Medical History  Diagnosis Date  . Unspecified essential hypertension   . Dizziness   . Paraspinal muscle spasm   . Calculus of kidney   . AORTIC VALVE REPLACEMENT, HX OF 07/06/2009    Qualifier: Diagnosis of  By: Purcell Nails, NP, Curt Bears    . Ascending aortic aneurysm 09/16/2011  . Long term current use of anticoagulant 05/30/2010    Past Surgical History  Procedure Laterality Date  . Right shoulder surgery    . Back surgery  2009  . Colonoscopy    . Cardiac valve replacement    . Eye surgery    . Left rotator cuff repair      Family History  Problem Relation Age of Onset  . Leukemia Brother   . Heart attack Brother   . Lung cancer Brother   . Colon cancer Brother   . Leukemia Sister    Social History:  reports that he quit smoking about 20 years ago. His smoking use included Cigarettes. He started smoking about 55 years ago. He has a 87.5 pack-year smoking history. He has never used smokeless tobacco. He reports that he drinks alcohol. He reports that he does not use illicit drugs.  Allergies:  Allergies  Allergen Reactions  . Codeine Nausea And Vomiting and Palpitations    Medications Prior to Admission  Medication Sig Dispense Refill  . amLODipine (NORVASC) 5 MG tablet Take 5 mg by mouth at bedtime.     . cetirizine (ZYRTEC) 10 MG tablet Take 10 mg by mouth daily.    . metoprolol succinate (TOPROL-XL) 25 MG 24 hr tablet Take 25 mg by mouth at bedtime.     . polyethylene glycol-electrolytes (NULYTELY/GOLYTELY) 420 G solution Take  4,000 mLs by mouth once. 4000 mL 0  . warfarin (COUMADIN) 10 MG tablet Take 1/2 tablet daily or as directed (Patient taking differently: Take 5 mg by mouth daily. Take 1/2 tablet daily or as directed) 90 tablet 3    No results found for this or any previous visit (from the past 48 hour(s)). No results found.  ROS  Blood pressure 144/67, pulse 65, temperature 97.6 F (36.4 C), temperature source Oral, resp. rate 17, height 5\' 8"  (1.727 m), weight 190 lb (86.183 kg), SpO2 97 %. Physical Exam  Constitutional: He appears well-developed and well-nourished.  HENT:  Mouth/Throat: Oropharynx is clear and moist.  Eyes: Conjunctivae are normal. No scleral icterus.  Neck: No thyromegaly present.  Cardiovascular:  Regular rhythm with normal S1 and loud snapping S2. Faint SEM at aortic area.  Respiratory: Effort normal and breath sounds normal.  GI: Soft. He exhibits no distension and no mass. There is no tenderness.  Musculoskeletal: He exhibits no edema.  Lymphadenopathy:    He has no cervical adenopathy.  Neurological: He is alert.  Skin: Skin is warm and dry.     Assessment/Plan High risk screening colonoscopy. History of CRC in brother.  REHMAN,NAJEEB U 10/16/2014, 9:25 AM

## 2014-10-20 ENCOUNTER — Encounter (HOSPITAL_COMMUNITY): Payer: Self-pay | Admitting: Internal Medicine

## 2014-10-29 ENCOUNTER — Ambulatory Visit (INDEPENDENT_AMBULATORY_CARE_PROVIDER_SITE_OTHER): Payer: Medicare Other | Admitting: *Deleted

## 2014-10-29 DIAGNOSIS — I359 Nonrheumatic aortic valve disorder, unspecified: Secondary | ICD-10-CM | POA: Diagnosis not present

## 2014-10-29 DIAGNOSIS — Z954 Presence of other heart-valve replacement: Secondary | ICD-10-CM

## 2014-10-29 DIAGNOSIS — Z952 Presence of prosthetic heart valve: Secondary | ICD-10-CM

## 2014-10-29 DIAGNOSIS — Z5181 Encounter for therapeutic drug level monitoring: Secondary | ICD-10-CM

## 2014-10-29 DIAGNOSIS — Z7901 Long term (current) use of anticoagulants: Secondary | ICD-10-CM

## 2014-10-29 LAB — POCT INR: INR: 2.9

## 2014-11-17 DIAGNOSIS — Z961 Presence of intraocular lens: Secondary | ICD-10-CM | POA: Diagnosis not present

## 2014-11-17 DIAGNOSIS — H04123 Dry eye syndrome of bilateral lacrimal glands: Secondary | ICD-10-CM | POA: Diagnosis not present

## 2014-11-17 DIAGNOSIS — H43813 Vitreous degeneration, bilateral: Secondary | ICD-10-CM | POA: Diagnosis not present

## 2014-11-24 DIAGNOSIS — Z23 Encounter for immunization: Secondary | ICD-10-CM | POA: Diagnosis not present

## 2014-12-08 ENCOUNTER — Ambulatory Visit (INDEPENDENT_AMBULATORY_CARE_PROVIDER_SITE_OTHER): Payer: Medicare Other | Admitting: *Deleted

## 2014-12-08 DIAGNOSIS — Z5181 Encounter for therapeutic drug level monitoring: Secondary | ICD-10-CM

## 2014-12-08 DIAGNOSIS — I359 Nonrheumatic aortic valve disorder, unspecified: Secondary | ICD-10-CM

## 2014-12-08 DIAGNOSIS — Z954 Presence of other heart-valve replacement: Secondary | ICD-10-CM | POA: Diagnosis not present

## 2014-12-08 DIAGNOSIS — Z952 Presence of prosthetic heart valve: Secondary | ICD-10-CM

## 2014-12-08 DIAGNOSIS — Z7901 Long term (current) use of anticoagulants: Secondary | ICD-10-CM

## 2014-12-08 LAB — POCT INR: INR: 3.1

## 2014-12-31 DIAGNOSIS — L728 Other follicular cysts of the skin and subcutaneous tissue: Secondary | ICD-10-CM | POA: Diagnosis not present

## 2014-12-31 DIAGNOSIS — Z85828 Personal history of other malignant neoplasm of skin: Secondary | ICD-10-CM | POA: Diagnosis not present

## 2015-01-19 ENCOUNTER — Ambulatory Visit (INDEPENDENT_AMBULATORY_CARE_PROVIDER_SITE_OTHER): Payer: Medicare Other | Admitting: *Deleted

## 2015-01-19 DIAGNOSIS — Z7901 Long term (current) use of anticoagulants: Secondary | ICD-10-CM

## 2015-01-19 DIAGNOSIS — Z954 Presence of other heart-valve replacement: Secondary | ICD-10-CM

## 2015-01-19 DIAGNOSIS — I359 Nonrheumatic aortic valve disorder, unspecified: Secondary | ICD-10-CM | POA: Diagnosis not present

## 2015-01-19 DIAGNOSIS — Z5181 Encounter for therapeutic drug level monitoring: Secondary | ICD-10-CM

## 2015-01-19 DIAGNOSIS — Z952 Presence of prosthetic heart valve: Secondary | ICD-10-CM

## 2015-01-19 LAB — POCT INR: INR: 3.2

## 2015-01-26 DIAGNOSIS — L570 Actinic keratosis: Secondary | ICD-10-CM | POA: Diagnosis not present

## 2015-01-26 DIAGNOSIS — Z85828 Personal history of other malignant neoplasm of skin: Secondary | ICD-10-CM | POA: Diagnosis not present

## 2015-03-04 ENCOUNTER — Ambulatory Visit (INDEPENDENT_AMBULATORY_CARE_PROVIDER_SITE_OTHER): Payer: Medicare Other | Admitting: *Deleted

## 2015-03-04 DIAGNOSIS — Z5181 Encounter for therapeutic drug level monitoring: Secondary | ICD-10-CM

## 2015-03-04 DIAGNOSIS — Z952 Presence of prosthetic heart valve: Secondary | ICD-10-CM

## 2015-03-04 DIAGNOSIS — Z954 Presence of other heart-valve replacement: Secondary | ICD-10-CM | POA: Diagnosis not present

## 2015-03-04 DIAGNOSIS — I359 Nonrheumatic aortic valve disorder, unspecified: Secondary | ICD-10-CM

## 2015-03-04 LAB — POCT INR: INR: 3.2

## 2015-04-03 DIAGNOSIS — Z6831 Body mass index (BMI) 31.0-31.9, adult: Secondary | ICD-10-CM | POA: Diagnosis not present

## 2015-04-03 DIAGNOSIS — R05 Cough: Secondary | ICD-10-CM | POA: Diagnosis not present

## 2015-04-15 ENCOUNTER — Ambulatory Visit (INDEPENDENT_AMBULATORY_CARE_PROVIDER_SITE_OTHER): Payer: Medicare Other | Admitting: *Deleted

## 2015-04-15 DIAGNOSIS — Z5181 Encounter for therapeutic drug level monitoring: Secondary | ICD-10-CM

## 2015-04-15 DIAGNOSIS — Z954 Presence of other heart-valve replacement: Secondary | ICD-10-CM | POA: Diagnosis not present

## 2015-04-15 DIAGNOSIS — I359 Nonrheumatic aortic valve disorder, unspecified: Secondary | ICD-10-CM | POA: Diagnosis not present

## 2015-04-15 DIAGNOSIS — Z952 Presence of prosthetic heart valve: Secondary | ICD-10-CM

## 2015-04-15 LAB — POCT INR: INR: 3.8

## 2015-04-19 DIAGNOSIS — Z719 Counseling, unspecified: Secondary | ICD-10-CM | POA: Diagnosis not present

## 2015-04-19 DIAGNOSIS — J40 Bronchitis, not specified as acute or chronic: Secondary | ICD-10-CM | POA: Diagnosis not present

## 2015-04-20 ENCOUNTER — Telehealth: Payer: Self-pay | Admitting: *Deleted

## 2015-04-20 NOTE — Telephone Encounter (Signed)
Patient has been put on antibiotics and Prednisone. Please call with instructions for Couamdin. / tg

## 2015-04-20 NOTE — Telephone Encounter (Signed)
Went to [facility] in [place] yesterday for bronchitis.  Was started on Doxycycline 100mg  bid and Prednisone 20mg  bid.  Will finish both on 04/25/15.  Told pt to decrease coumadin to 5mg  daily except none on Tuesday and Friday and come for INR check in 04/27/15.  Wife verbalized understanding.

## 2015-04-27 ENCOUNTER — Ambulatory Visit (INDEPENDENT_AMBULATORY_CARE_PROVIDER_SITE_OTHER): Payer: Medicare Other | Admitting: *Deleted

## 2015-04-27 DIAGNOSIS — I359 Nonrheumatic aortic valve disorder, unspecified: Secondary | ICD-10-CM

## 2015-04-27 DIAGNOSIS — Z952 Presence of prosthetic heart valve: Secondary | ICD-10-CM

## 2015-04-27 DIAGNOSIS — Z5181 Encounter for therapeutic drug level monitoring: Secondary | ICD-10-CM | POA: Diagnosis not present

## 2015-04-27 DIAGNOSIS — Z954 Presence of other heart-valve replacement: Secondary | ICD-10-CM | POA: Diagnosis not present

## 2015-04-27 LAB — POCT INR: INR: 1.5

## 2015-04-28 DIAGNOSIS — S8011XS Contusion of right lower leg, sequela: Secondary | ICD-10-CM | POA: Diagnosis not present

## 2015-04-28 DIAGNOSIS — K921 Melena: Secondary | ICD-10-CM | POA: Diagnosis not present

## 2015-05-04 ENCOUNTER — Ambulatory Visit (INDEPENDENT_AMBULATORY_CARE_PROVIDER_SITE_OTHER): Payer: Medicare Other | Admitting: *Deleted

## 2015-05-04 DIAGNOSIS — I359 Nonrheumatic aortic valve disorder, unspecified: Secondary | ICD-10-CM | POA: Diagnosis not present

## 2015-05-04 DIAGNOSIS — Z954 Presence of other heart-valve replacement: Secondary | ICD-10-CM

## 2015-05-04 DIAGNOSIS — Z5181 Encounter for therapeutic drug level monitoring: Secondary | ICD-10-CM | POA: Diagnosis not present

## 2015-05-04 DIAGNOSIS — Z952 Presence of prosthetic heart valve: Secondary | ICD-10-CM

## 2015-05-04 LAB — POCT INR: INR: 3.6

## 2015-05-17 ENCOUNTER — Encounter (HOSPITAL_COMMUNITY): Payer: Self-pay | Admitting: Oncology

## 2015-05-17 DIAGNOSIS — D72829 Elevated white blood cell count, unspecified: Secondary | ICD-10-CM | POA: Insufficient documentation

## 2015-05-17 NOTE — Progress Notes (Signed)
[facility] Hematology/Oncology Consultation   Name: Stephen Moses      MRN: [id number]   Date: 05/18/2015 Time:4:10 PM   REFERRING PHYSICIAN:  [name], MD (Primary Care Provider)  REASON FOR CONSULT:  Leukocytosis   DIAGNOSIS:  Leukocytosis with neutrophilia and monocytosis predominance dating back to at least 2009.  Additionally, he has a thrombocytopenia dating back to at least 2010.  He also has a normocytic, normochromic anemia dating back to at least 2010.  HISTORY OF PRESENT ILLNESS:   Stephen Moses, who goes by the name Stephen Moses, is a 70 year old white American man with a past medical history significant for AAA S/P graft, aortic valve disorder with aortic valve replacement, HTN, emphysema on CT imaging with a history of tobacco abuse, seasonal allergies who is referred to [facility] by his primary care provider for leukocytosis.  I personally reviewed and went over laboratory results with the patient.  The results are noted within this dictation.  leukocytosis is noted dating back to at least 2009 with neutrophilia and monocytosis predominance.  I personally reviewed and went over radiographic studies with the patient.  The results are noted within this dictation.  CT of chest in April 2016 demonstrates emphysema changes and pulmonary scarring with stable borderline enlarged mediastinal and hilar lymph nodes.  He notes a few weeks ago a right lower extremity ecchymosis on the lateral aspect after working on his lawnmowers.  It was large and is healed and resolved as of today.  It was below the knee.  He notes that this was the impetus to his referral to [place].    He admits to a family history of leukemia.  He denies any recurrent infections and denies any inflammatory diagnoses.  He denies any known thyroid abnormalities.  He denies a splenectomy.  He denies any recent vaccinations.    Review of Systems  Constitutional: Negative for fever,  chills, weight loss, malaise/fatigue and diaphoresis.  HENT: Negative for congestion, ear pain, nosebleeds and sore throat.   Eyes: Negative for blurred vision.  Respiratory: Negative for cough, hemoptysis, sputum production, shortness of breath, wheezing and stridor.   Cardiovascular: Negative for chest pain, palpitations, claudication and leg swelling.  Gastrointestinal: Negative for heartburn, nausea, vomiting, abdominal pain, diarrhea, constipation, blood in stool and melena.  Genitourinary: Negative for dysuria, urgency, frequency and hematuria.  Musculoskeletal: Negative for myalgias and falls.  Skin: Negative for rash.  Neurological: Negative for dizziness, tremors, speech change, focal weakness, seizures, loss of consciousness, weakness and headaches.  Endo/Heme/Allergies: Positive for environmental allergies. Negative for polydipsia. Bruises/bleeds easily.  Psychiatric/Behavioral: Negative for depression and suicidal ideas. The patient is not nervous/anxious.     PAST MEDICAL HISTORY:   Past Medical History  Diagnosis Date  . Unspecified essential hypertension   . Dizziness   . Paraspinal muscle spasm   . Calculus of kidney   . AORTIC VALVE REPLACEMENT, HX OF 07/06/2009    Qualifier: Diagnosis of  By: Purcell Nails, NP, Curt Bears    . Ascending aortic aneurysm ([place]) 09/16/2011  . Long term current use of anticoagulant 05/30/2010  . Leukocytosis 05/17/2015  . Pancytopenia ([place]) 05/18/2015  . Normocytic anemia 05/18/2015  . Thrombocytopenia ([place]) 05/18/2015    ALLERGIES: Allergies  Allergen Reactions  . Codeine Nausea And Vomiting and Palpitations      MEDICATIONS: I have reviewed the patient's current medications.    Current Outpatient Prescriptions on File Prior to Visit  Medication Sig Dispense Refill  . amLODipine (NORVASC) 5 MG tablet Take 5 mg by mouth at bedtime.     . metoprolol succinate (TOPROL-XL) 25 MG 24 hr tablet Take 25 mg by mouth at bedtime.     [name] warfarin  (COUMADIN) 10 MG tablet Take 1/2 tablet daily or as directed (Patient taking differently: Take 5 mg by mouth daily. Take 1/2 tablet daily or as directed) 90 tablet 3  . cetirizine (ZYRTEC) 10 MG tablet Take 10 mg by mouth daily. Reported on 05/18/2015     No current facility-administered medications on file prior to visit.     PAST SURGICAL HISTORY Past Surgical History  Procedure Laterality Date  . Right shoulder surgery    . Back surgery  2009  . Colonoscopy    . Cardiac valve replacement    . Eye surgery    . Left rotator cuff repair    . Colonoscopy N/A 10/16/2014    Procedure: COLONOSCOPY;  Surgeon: Rogene Houston, MD;  Location: [facility];  Service: Endoscopy;  Laterality: N/A;  930    FAMILY HISTORY: Family History  Problem Relation Age of Onset  . Leukemia Brother   . Heart attack Brother   . Lung cancer Brother   . Colon cancer Brother   . Leukemia Sister   He reports a brother with lung cancer, and another brother with leukemia.  He also has a sister with leukemia.   He has no children. His mother is deceased at the age of 76 from "old age." His father is deceased in his 14's while in jail for aiding and abetting an escaped convict after being in a jail fight.   SOCIAL HISTORY:  reports that he quit smoking about 21 years ago. His smoking use included Cigarettes. He started smoking about 56 years ago. He has a 87.5 pack-year smoking history. He has never used smokeless tobacco. He reports that he drinks alcohol. He reports that he does not use illicit drugs.  He is married x 7 years, having known his wife x 32 years.  He does not have children nor grandchildren.  He is [employer]  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  PERFORMANCE STATUS: The patient's performance status is 1 - Symptomatic but completely ambulatory  PHYSICAL EXAM: Most Recent Vital Signs: Blood pressure 139/79, pulse 72, temperature 97.8 F (36.6  C), temperature source Oral, resp. rate 18, height '5\' 8"'$  (1.727 m), weight 200 lb (90.719 kg), SpO2 98 %. BP 139/79 mmHg  Pulse 72  Temp(Src) 97.8 F (36.6 C) (Oral)  Resp 18  Ht '5\' 8"'$  (1.727 m)  Wt 200 lb (90.719 kg)  BMI 30.42 kg/m2  SpO2 98%  General Appearance:    Alert, cooperative, no distress, appears older than stated age  Head:    Normocephalic, without obvious abnormality, atraumatic  Eyes:    Conjunctiva/corneas clear, EOM's intact        Throat:   Lips, mucosa, and tongue normal; upper dentures and gums normal  Neck:   Supple, symmetrical, trachea midline, no adenopathy;       thyroid:  No enlargement/tenderness/nodules; no carotid   bruit or JVD  Back:     Symmetric, no curvature, ROM normal, no CVA tenderness  Lungs:     Clear to auscultation bilaterally, respirations unlabored  Chest wall:    No tenderness or deformity  Heart:    Regular rate and rhythm, S1 and S2 normal,  with a systolic click murmur from mechanical aortic valve replacement, and no rub or gallop  Abdomen:     Soft, non-tender, bowel sounds active all four quadrants,    no masses, no organomegaly  Extremities:   Extremities normal, atraumatic, no cyanosis or edema  Pulses:   2+ and symmetric all extremities  Skin:   Skin color, texture, turgor normal, no rashes or lesions  Lymph nodes:   Cervical, supraclavicular, and axillary nodes normal  Neurologic:   CNII-XII intact. Normal strength, sensation and reflexes      throughout    LABORATORY DATA:  CBC    Component Value Date/Time   WBC 10.9* 05/18/2015 1255   RBC 4.62 05/18/2015 1255   RBC 4.62 05/18/2015 1255   HGB 13.1 05/18/2015 1255   HCT 39.3 05/18/2015 1255   PLT 250 05/18/2015 1255   MCV 85.1 05/18/2015 1255   MCH 28.4 05/18/2015 1255   MCHC 33.3 05/18/2015 1255   RDW 14.3 05/18/2015 1255   LYMPHSABS 2.8 05/18/2015 1255   MONOABS 0.7 05/18/2015 1255   EOSABS 0.3 05/18/2015 1255   BASOSABS 0.1 05/18/2015 1255      Chemistry       Component Value Date/Time   NA 138 04/01/2014 0608   K 4.2 04/01/2014 0608   CL 104 04/01/2014 0608   CO2 25 04/01/2014 0608   BUN 21 04/01/2014 0608   CREATININE 1.69* 04/01/2014 0608      Component Value Date/Time   CALCIUM 8.9 04/01/2014 0608   ALKPHOS 63 03/30/2014 1018   AST 39* 03/30/2014 1018   ALT 32 03/30/2014 1018   BILITOT 0.6 03/30/2014 1018       RADIOGRAPHY:   CLINICAL DATA: Shortness of breath, wheezing and productive cough for 8 weeks. History of aneurysm repair and valve replacement surgery.  EXAM: CT CHEST WITHOUT CONTRAST  TECHNIQUE: Multidetector CT imaging of the chest was performed following the standard protocol without IV contrast.  COMPARISON: 03/31/2014  FINDINGS: Chest wall: No chest wall mass, supraclavicular or axillary lymphadenopathy. Small scattered lymph nodes are stable. The thyroid gland appears normal. The bony thorax is intact. Stable surgical changes with median sternotomy wires.  Mediastinum: The heart is normal in size and stable. No pericardial effusion. Stable borderline enlarged mediastinal and hilar lymph nodes but no mass. Stable surgical changes from aortic valve replacement surgery and ascending aortic graft. No complicating features. The esophagus is grossly normal.  Lungs/ pleura: Stable emphysematous changes and pulmonary scarring. No acute overlying pulmonary process. No worrisome pulmonary lesions. No bronchiectasis.  Upper abdomen: No significant findings.  IMPRESSION: 1. Stable emphysematous changes and pulmonary scarring. No acute overlying pulmonary process. 2. Stable borderline enlarged mediastinal and hilar lymph nodes. 3. Stable surgical changes from aortic valve replacement surgery and ascending aortic graft.   Electronically Signed  By: Rudie Meyer M.D.  On: 06/03/2014 16:55   PATHOLOGY:  N/A  ASSESSMENT/PLAN:   Leukocytosis Leukocytosis with neutrophilia and  monocytosis predominance dating back to at least 2009.  Hospitalization in Feb 2016 for CAP but no other recent hospitalizations for infections noted in [facility].  I reviewed the potential causes of leukocytosis (specifically mild neutrophilia) including but not limited to: ?Any active inflammatory condition or infection ?Cigarette smoking, which may be the most common cause of mild neutrophilia ?Previously diagnosed hematologic disease (such as acute and chronic leukemias, chronic myeloproliferative or myelodysplastic disease) ?The presence of, and treatment for, a chronic anxiety state, panic disorder, rage, or emotional stress (eg, posttraumatic stress  disorder, depression) ?Presence of non-hematologic diseases known to increase neutrophil counts (eg, eclampsia, thyroid storm, hypercortisolism). ?Prior splenectomy or known asplenia ?Positive family history of neutrophilia ?Recent vaccination  Medications - Various medications may cause neutrophilia. However,  such cases are rare and appear in the literature as isolated case reports.  Plan today is to proceed with a CBC with peripheral smear review, evaluation for MPD, BCR-ABL to r/o CML, CRP and ESR to look for occult inflammatory disease, and TSH.  Will add a FLOW cytometry as well.  On chart review, CT imaging from April 2016 demonstrated mediastinal and hilar borderline lymphadenopathy.  Therefore, will repeat CT of chest with contrast to follow-up on adenopathy.  We will see him back once his lab results have come back in, in two-three weeks. We will go over everything at that time.   Normocytic anemia Normocytic, normochromic anemia dating back to at least 2010.  Labs today: CBC diff, anemia panel, retic count, haptoglobin, LDH, EPO level, SPEP + IFE, and light chain assay.  Thrombocytopenia ([place]) Thrombocytopenia dating back to at least 2010.  Labs today: CBC diff, SPEP + IFE, and light chain assay.  Bone marrow biopsy will only  be pursued pending the results of the studies above.   All questions were answered. The patient knows to call the clinic with any problems, questions or concerns. We can certainly see the patient much sooner if necessary.  This note is electronically signed by: Molli Hazard, MD  05/18/2015 4:10 PM

## 2015-05-17 NOTE — Assessment & Plan Note (Addendum)
Leukocytosis with neutrophilia and monocytosis predominance dating back to at least 2009.  Hospitalization in Feb 2016 for CAP but no other recent hospitalizations for infections noted in [facility].  I reviewed the potential causes of leukocytosis (specifically mild neutrophilia) including but not limited to: ?Any active inflammatory condition or infection ?Cigarette smoking, which may be the most common cause of mild neutrophilia ?Previously diagnosed hematologic disease (such as acute and chronic leukemias, chronic myeloproliferative or myelodysplastic disease) ?The presence of, and treatment for, a chronic anxiety state, panic disorder, rage, or emotional stress (eg, posttraumatic stress disorder, depression) ?Presence of non-hematologic diseases known to increase neutrophil counts (eg, eclampsia, thyroid storm, hypercortisolism). ?Prior splenectomy or known asplenia ?Positive family history of neutrophilia ?Recent vaccination  Medications - Various medications may cause neutrophilia. However,  such cases are rare and appear in the literature as isolated case reports.  Plan today is to proceed with a CBC with peripheral smear review, evaluation for MPD, BCR-ABL to r/o CML, CRP and ESR to look for occult inflammatory disease, and TSH.  Will add a FLOW cytometry as well.  On chart review, CT imaging from April 2016 demonstrated mediastinal and hilar borderline lymphadenopathy.  Therefore, will repeat CT of chest with contrast to follow-up on adenopathy.  We will see him back once her lab results have come back in, in two-three weeks. We will go over everything at that time.

## 2015-05-18 ENCOUNTER — Ambulatory Visit (INDEPENDENT_AMBULATORY_CARE_PROVIDER_SITE_OTHER): Payer: Medicare Other | Admitting: *Deleted

## 2015-05-18 ENCOUNTER — Encounter (HOSPITAL_COMMUNITY): Payer: Self-pay | Admitting: Oncology

## 2015-05-18 ENCOUNTER — Encounter (HOSPITAL_COMMUNITY): Payer: Medicare Other | Attending: Oncology | Admitting: Oncology

## 2015-05-18 ENCOUNTER — Encounter (HOSPITAL_COMMUNITY): Payer: Medicare Other

## 2015-05-18 VITALS — BP 139/79 | HR 72 | Temp 97.8°F | Resp 18 | Ht 68.0 in | Wt 200.0 lb

## 2015-05-18 DIAGNOSIS — D649 Anemia, unspecified: Secondary | ICD-10-CM

## 2015-05-18 DIAGNOSIS — Z5181 Encounter for therapeutic drug level monitoring: Secondary | ICD-10-CM | POA: Diagnosis not present

## 2015-05-18 DIAGNOSIS — Z800 Family history of malignant neoplasm of digestive organs: Secondary | ICD-10-CM | POA: Diagnosis not present

## 2015-05-18 DIAGNOSIS — R591 Generalized enlarged lymph nodes: Secondary | ICD-10-CM

## 2015-05-18 DIAGNOSIS — D61818 Other pancytopenia: Secondary | ICD-10-CM

## 2015-05-18 DIAGNOSIS — D696 Thrombocytopenia, unspecified: Secondary | ICD-10-CM | POA: Insufficient documentation

## 2015-05-18 DIAGNOSIS — Z885 Allergy status to narcotic agent status: Secondary | ICD-10-CM | POA: Insufficient documentation

## 2015-05-18 DIAGNOSIS — D72829 Elevated white blood cell count, unspecified: Secondary | ICD-10-CM | POA: Diagnosis not present

## 2015-05-18 DIAGNOSIS — Z806 Family history of leukemia: Secondary | ICD-10-CM | POA: Diagnosis not present

## 2015-05-18 DIAGNOSIS — Z79899 Other long term (current) drug therapy: Secondary | ICD-10-CM | POA: Insufficient documentation

## 2015-05-18 DIAGNOSIS — Z952 Presence of prosthetic heart valve: Secondary | ICD-10-CM | POA: Insufficient documentation

## 2015-05-18 DIAGNOSIS — Z87891 Personal history of nicotine dependence: Secondary | ICD-10-CM | POA: Insufficient documentation

## 2015-05-18 DIAGNOSIS — Z7901 Long term (current) use of anticoagulants: Secondary | ICD-10-CM | POA: Diagnosis not present

## 2015-05-18 DIAGNOSIS — I359 Nonrheumatic aortic valve disorder, unspecified: Secondary | ICD-10-CM | POA: Diagnosis not present

## 2015-05-18 DIAGNOSIS — Z954 Presence of other heart-valve replacement: Secondary | ICD-10-CM

## 2015-05-18 DIAGNOSIS — Z801 Family history of malignant neoplasm of trachea, bronchus and lung: Secondary | ICD-10-CM | POA: Insufficient documentation

## 2015-05-18 HISTORY — DX: Other pancytopenia: D61.818

## 2015-05-18 HISTORY — DX: Anemia, unspecified: D64.9

## 2015-05-18 LAB — RETICULOCYTES
RBC.: 4.62 MIL/uL (ref 4.22–5.81)
RETIC CT PCT: 1.9 % (ref 0.4–3.1)
Retic Count, Absolute: 87.8 10*3/uL (ref 19.0–186.0)

## 2015-05-18 LAB — CBC WITH DIFFERENTIAL/PLATELET
Basophils Absolute: 0.1 10*3/uL (ref 0.0–0.1)
Basophils Relative: 1 %
EOS ABS: 0.3 10*3/uL (ref 0.0–0.7)
EOS PCT: 3 %
HCT: 39.3 % (ref 39.0–52.0)
Hemoglobin: 13.1 g/dL (ref 13.0–17.0)
LYMPHS ABS: 2.8 10*3/uL (ref 0.7–4.0)
LYMPHS PCT: 26 %
MCH: 28.4 pg (ref 26.0–34.0)
MCHC: 33.3 g/dL (ref 30.0–36.0)
MCV: 85.1 fL (ref 78.0–100.0)
MONO ABS: 0.7 10*3/uL (ref 0.1–1.0)
MONOS PCT: 7 %
Neutro Abs: 7 10*3/uL (ref 1.7–7.7)
Neutrophils Relative %: 63 %
PLATELETS: 250 10*3/uL (ref 150–400)
RBC: 4.62 MIL/uL (ref 4.22–5.81)
RDW: 14.3 % (ref 11.5–15.5)
WBC: 10.9 10*3/uL — ABNORMAL HIGH (ref 4.0–10.5)

## 2015-05-18 LAB — SEDIMENTATION RATE: Sed Rate: 10 mm/hr (ref 0–16)

## 2015-05-18 LAB — IRON AND TIBC
Iron: 41 ug/dL — ABNORMAL LOW (ref 45–182)
Saturation Ratios: 13 % — ABNORMAL LOW (ref 17.9–39.5)
TIBC: 328 ug/dL (ref 250–450)
UIBC: 287 ug/dL

## 2015-05-18 LAB — C-REACTIVE PROTEIN: CRP: 1.6 mg/dL — AB (ref <1.0)

## 2015-05-18 LAB — TSH: TSH: 1.271 u[IU]/mL (ref 0.350–4.500)

## 2015-05-18 LAB — LACTATE DEHYDROGENASE: LDH: 245 U/L — ABNORMAL HIGH (ref 98–192)

## 2015-05-18 LAB — FERRITIN: Ferritin: 68 ng/mL (ref 24–336)

## 2015-05-18 LAB — POCT INR: INR: 3.1

## 2015-05-18 LAB — VITAMIN B12: VITAMIN B 12: 301 pg/mL (ref 180–914)

## 2015-05-18 LAB — FOLATE: FOLATE: 9.4 ng/mL (ref >5.9)

## 2015-05-18 NOTE — Assessment & Plan Note (Signed)
Thrombocytopenia dating back to at least 2010.  Labs today: CBC diff, SPEP + IFE, and light chain assay.

## 2015-05-18 NOTE — Assessment & Plan Note (Addendum)
Normocytic, normochromic anemia dating back to at least 2010.  Labs today: CBC diff, anemia panel, retic count, haptoglobin, LDH, EPO level, SPEP + IFE, and light chain assay.

## 2015-05-18 NOTE — Patient Instructions (Addendum)
[place] at [facility] Discharge Instructions  RECOMMENDATIONS MADE BY THE CONSULTANT AND ANY TEST RESULTS WILL BE SENT TO YOUR REFERRING PHYSICIAN.  Exam done and seen today by Kirby Crigler Dr. Willey Blade sent you here today because of your elevated WBC count. You have some bruising on the right leg below the knee. You will need to get some blood work done today. Will need to get a CT Chest in 2 weeks. Return to see the Doctor in 3 weeks Call the clinic for any concerns or questions.   Thank you for choosing [place] at [facility] to provide your oncology and hematology care.  To afford each patient quality time with our provider, please arrive at least 15 minutes before your scheduled appointment time.   Beginning January 23rd 2017 lab work for the [facility] will be done in the  Main lab at [facility] on 1st floor. If you have a lab appointment with the [facility] please come in thru the  Main Entrance and check in at the main information desk  You need to re-schedule your appointment should you arrive 10 or more minutes late.  We strive to give you quality time with our providers, and arriving late affects you and other patients whose appointments are after yours.  Also, if you no show three or more times for appointments you may be dismissed from the clinic at the providers discretion.     Again, thank you for choosing [facility]  Our hope is that these requests will decrease the amount of time that you wait before being seen by our physicians.       _____________________________________________________________  Should you have questions after your visit to [facility], please contact our office at [phone] between the hours of 8:30 a.m. and 4:30 p.m.  Voicemails left after 4:30 p.m. will not be returned until the following business day.  For prescription refill requests, have your pharmacy contact our  office.         Resources For Cancer Patients and their Caregivers ? American Cancer Society: Can assist with transportation, wigs, general needs, runs Look Good Feel Better.        364-166-8591 ? Cancer Care: Provides financial assistance, online support groups, medication/co-pay assistance.  1-800-813-HOPE 9413046334) ? [facility] Assists [employer] [place] cancer patients and their families through emotional , educational and financial support.  (612) 248-7706 ? [place] DSS Where to apply for food stamps, Medicaid and utility assistance. 701-771-8612 ? [facility]: Transportation to medical appointments. (734) 550-8691 ? Social Security Administration: May apply for disability if have a Stage IV cancer. (737) 175-3536 (701) 508-0500 ? [employer], Disability and Transit Services: Assists with nutrition, care and transit needs. ([phone]

## 2015-05-19 LAB — IGG, IGA, IGM
IGA: 277 mg/dL (ref 61–437)
IGM, SERUM: 97 mg/dL (ref 20–172)
IgG (Immunoglobin G), Serum: 1616 mg/dL — ABNORMAL HIGH (ref 700–1600)

## 2015-05-19 LAB — PROTEIN ELECTROPHORESIS, SERUM
A/G RATIO SPE: 1 (ref 0.7–1.7)
ALBUMIN ELP: 3.8 g/dL (ref 2.9–4.4)
ALPHA-1-GLOBULIN: 0.2 g/dL (ref 0.0–0.4)
ALPHA-2-GLOBULIN: 0.6 g/dL (ref 0.4–1.0)
BETA GLOBULIN: 1.1 g/dL (ref 0.7–1.3)
GLOBULIN, TOTAL: 3.7 g/dL (ref 2.2–3.9)
Gamma Globulin: 1.8 g/dL (ref 0.4–1.8)
Total Protein ELP: 7.5 g/dL (ref 6.0–8.5)

## 2015-05-19 LAB — IMMUNOFIXATION ELECTROPHORESIS
IGM, SERUM: 98 mg/dL (ref 20–172)
IgA: 277 mg/dL (ref 61–437)
IgG (Immunoglobin G), Serum: 1706 mg/dL — ABNORMAL HIGH (ref 700–1600)
TOTAL PROTEIN ELP: 7.5 g/dL (ref 6.0–8.5)

## 2015-05-19 LAB — KAPPA/LAMBDA LIGHT CHAINS
Kappa free light chain: 69.55 mg/L — ABNORMAL HIGH (ref 3.30–19.40)
Kappa, lambda light chain ratio: 1.53 (ref 0.26–1.65)
LAMDA FREE LIGHT CHAINS: 45.33 mg/L — AB (ref 5.71–26.30)

## 2015-05-19 LAB — ERYTHROPOIETIN: ERYTHROPOIETIN: 8.9 m[IU]/mL (ref 2.6–18.5)

## 2015-05-19 LAB — HAPTOGLOBIN: Haptoglobin: <10 mg/dL — ABNORMAL LOW (ref 34–200)

## 2015-05-19 LAB — PATHOLOGIST SMEAR REVIEW

## 2015-05-20 LAB — MULTIPLE MYELOMA PANEL, SERUM
ALBUMIN SERPL ELPH-MCNC: 3.6 g/dL (ref 2.9–4.4)
ALPHA 1: 0.2 g/dL (ref 0.0–0.4)
Albumin/Glob SerPl: 1 (ref 0.7–1.7)
Alpha2 Glob SerPl Elph-Mcnc: 0.6 g/dL (ref 0.4–1.0)
B-GLOBULIN SERPL ELPH-MCNC: 1 g/dL (ref 0.7–1.3)
GAMMA GLOB SERPL ELPH-MCNC: 1.9 g/dL — AB (ref 0.4–1.8)
GLOBULIN, TOTAL: 3.7 g/dL (ref 2.2–3.9)
IgA: 287 mg/dL (ref 61–437)
IgG (Immunoglobin G), Serum: 1657 mg/dL — ABNORMAL HIGH (ref 700–1600)
IgM, Serum: 94 mg/dL (ref 20–172)
Total Protein ELP: 7.3 g/dL (ref 6.0–8.5)

## 2015-05-25 DIAGNOSIS — Z79899 Other long term (current) drug therapy: Secondary | ICD-10-CM | POA: Diagnosis not present

## 2015-05-25 DIAGNOSIS — Z125 Encounter for screening for malignant neoplasm of prostate: Secondary | ICD-10-CM | POA: Diagnosis not present

## 2015-05-25 DIAGNOSIS — R7301 Impaired fasting glucose: Secondary | ICD-10-CM | POA: Diagnosis not present

## 2015-05-25 DIAGNOSIS — N200 Calculus of kidney: Secondary | ICD-10-CM | POA: Diagnosis not present

## 2015-06-01 ENCOUNTER — Ambulatory Visit (HOSPITAL_COMMUNITY)
Admission: RE | Admit: 2015-06-01 | Discharge: 2015-06-01 | Disposition: A | Discharge status: Home / Self Care | Payer: Medicare Other | Source: Ambulatory Visit | Attending: Oncology | Admitting: Oncology

## 2015-06-01 DIAGNOSIS — D72829 Elevated white blood cell count, unspecified: Secondary | ICD-10-CM | POA: Diagnosis not present

## 2015-06-01 DIAGNOSIS — J439 Emphysema, unspecified: Secondary | ICD-10-CM | POA: Insufficient documentation

## 2015-06-01 DIAGNOSIS — R591 Generalized enlarged lymph nodes: Secondary | ICD-10-CM | POA: Diagnosis not present

## 2015-06-01 LAB — CALR + JAK2 E12-15 + MPL (REFLEXED)

## 2015-06-01 LAB — JAK2 V617F, W REFLEX TO CALR/E12/MPL

## 2015-06-01 LAB — POCT I-STAT CREATININE: CREATININE: 1.8 mg/dL — AB (ref 0.61–1.24)

## 2015-06-01 MED ORDER — IOPAMIDOL (ISOVUE-300) INJECTION 61%
75.0000 mL | Freq: Once | INTRAVENOUS | Status: AC | PRN
  Administered 2015-06-01: 75 mL via INTRAVENOUS

## 2015-06-02 DIAGNOSIS — I712 Thoracic aortic aneurysm, without rupture: Secondary | ICD-10-CM | POA: Diagnosis not present

## 2015-06-02 DIAGNOSIS — I10 Essential (primary) hypertension: Secondary | ICD-10-CM | POA: Diagnosis not present

## 2015-06-02 DIAGNOSIS — Z6829 Body mass index (BMI) 29.0-29.9, adult: Secondary | ICD-10-CM | POA: Diagnosis not present

## 2015-06-02 DIAGNOSIS — N183 Chronic kidney disease, stage 3 (moderate): Secondary | ICD-10-CM | POA: Diagnosis not present

## 2015-06-03 ENCOUNTER — Telehealth (HOSPITAL_COMMUNITY): Payer: Self-pay | Admitting: Emergency Medicine

## 2015-06-10 ENCOUNTER — Ambulatory Visit (HOSPITAL_COMMUNITY): Payer: Medicare Other | Admitting: Oncology

## 2015-06-10 ENCOUNTER — Ambulatory Visit (HOSPITAL_COMMUNITY): Payer: Medicare Other | Admitting: Hematology & Oncology

## 2015-06-10 LAB — BCR-ABL1, CML/ALL, PCR, QUANT

## 2015-06-12 ENCOUNTER — Encounter (HOSPITAL_COMMUNITY): Payer: Medicare Other | Attending: Hematology & Oncology | Admitting: Hematology & Oncology

## 2015-06-12 ENCOUNTER — Encounter (HOSPITAL_COMMUNITY): Payer: Self-pay | Admitting: Hematology & Oncology

## 2015-06-12 VITALS — BP 141/67 | HR 63 | Temp 97.8°F | Resp 18 | Wt 200.6 lb

## 2015-06-12 DIAGNOSIS — Z952 Presence of prosthetic heart valve: Secondary | ICD-10-CM | POA: Insufficient documentation

## 2015-06-12 DIAGNOSIS — D72829 Elevated white blood cell count, unspecified: Secondary | ICD-10-CM

## 2015-06-12 DIAGNOSIS — Z79899 Other long term (current) drug therapy: Secondary | ICD-10-CM | POA: Insufficient documentation

## 2015-06-12 DIAGNOSIS — R790 Abnormal level of blood mineral: Secondary | ICD-10-CM

## 2015-06-12 DIAGNOSIS — E538 Deficiency of other specified B group vitamins: Secondary | ICD-10-CM

## 2015-06-12 DIAGNOSIS — D649 Anemia, unspecified: Secondary | ICD-10-CM | POA: Insufficient documentation

## 2015-06-12 DIAGNOSIS — D696 Thrombocytopenia, unspecified: Secondary | ICD-10-CM | POA: Insufficient documentation

## 2015-06-12 DIAGNOSIS — Z87891 Personal history of nicotine dependence: Secondary | ICD-10-CM | POA: Insufficient documentation

## 2015-06-12 DIAGNOSIS — Z885 Allergy status to narcotic agent status: Secondary | ICD-10-CM | POA: Insufficient documentation

## 2015-06-12 DIAGNOSIS — D509 Iron deficiency anemia, unspecified: Secondary | ICD-10-CM | POA: Diagnosis not present

## 2015-06-12 DIAGNOSIS — Z7901 Long term (current) use of anticoagulants: Secondary | ICD-10-CM | POA: Insufficient documentation

## 2015-06-12 DIAGNOSIS — Z800 Family history of malignant neoplasm of digestive organs: Secondary | ICD-10-CM | POA: Insufficient documentation

## 2015-06-12 DIAGNOSIS — Z806 Family history of leukemia: Secondary | ICD-10-CM | POA: Insufficient documentation

## 2015-06-12 DIAGNOSIS — Z801 Family history of malignant neoplasm of trachea, bronchus and lung: Secondary | ICD-10-CM | POA: Insufficient documentation

## 2015-06-12 MED ORDER — IRON POLYSACCH CMPLX-B12-FA 150-0.025-1 MG PO CAPS: 1.0000 | ORAL_CAPSULE | Freq: Every day | ORAL | Status: DC

## 2015-06-12 NOTE — Patient Instructions (Signed)
[place] at [facility] Discharge Instructions  RECOMMENDATIONS MADE BY THE CONSULTANT AND ANY TEST RESULTS WILL BE SENT TO YOUR REFERRING PHYSICIAN.   Exam and discussion by Dr Whitney Muse today niferex script given Take B12 [id number] daily Take folic acid A999333 mg daily Return to see the doctor in 3 months with labs Please call the clinic if you have any questions or concerns     Thank you for choosing [place] at [facility] to provide your oncology and hematology care.  To afford each patient quality time with our provider, please arrive at least 15 minutes before your scheduled appointment time.   Beginning January 23rd 2017 lab work for the [facility] will be done in the  Main lab at [facility] on 1st floor. If you have a lab appointment with the [facility] please come in thru the  Main Entrance and check in at the main information desk  You need to re-schedule your appointment should you arrive 10 or more minutes late.  We strive to give you quality time with our providers, and arriving late affects you and other patients whose appointments are after yours.  Also, if you no show three or more times for appointments you may be dismissed from the clinic at the providers discretion.     Again, thank you for choosing [facility].  Our hope is that these requests will decrease the amount of time that you wait before being seen by our physicians.       _____________________________________________________________  Should you have questions after your visit to [facility], please contact our office at [phone] between the hours of 8:30 a.m. and 4:30 p.m.  Voicemails left after 4:30 p.m. will not be returned until the following business day.  For prescription refill requests, have your pharmacy contact our office.         Resources For Cancer Patients and their Caregivers ? American Cancer Society: Can  assist with transportation, wigs, general needs, runs Look Good Feel Better.        724-854-2335 ? Cancer Care: Provides financial assistance, online support groups, medication/co-pay assistance.  1-800-813-HOPE 507 741 3120) ? [facility] Assists [place] cancer patients and their families through emotional , educational and financial support.  680 870 9509 ? [place] DSS Where to apply for food stamps, Medicaid and utility assistance. 617-415-7242 ? RCATS: Transportation to medical appointments. 910-664-8520 ? Social Security Administration: May apply for disability if have a Stage IV cancer. 681-214-2276 236-594-5921 ? [employer], Disability and Transit Services: Assists with nutrition, care and transit needs. [phone]

## 2015-06-12 NOTE — Progress Notes (Signed)
[facility] Hematology/Oncology Progress Note  Name: Stephen Moses      MRN: [id number]   Date: 06/12/2015 Time:9:39 AM   REFERRING PHYSICIAN:  [name], MD (Primary Care Provider)  REASON FOR CONSULT:  Leukocytosis   DIAGNOSIS:  Leukocytosis with neutrophilia and monocytosis predominance dating back to at least 2009.  Additionally, he has a thrombocytopenia dating back to at least 2010.  He also has a normocytic, normochromic anemia dating back to at least 2010.  HISTORY OF PRESENT ILLNESS:   Stephen Moses, who goes by the name Stephen Moses, is a 70 year old white American man with a past medical history significant for AAA S/P graft, aortic valve disorder with aortic valve replacement, HTN, emphysema on CT imaging with a history of tobacco abuse, seasonal allergies who is referred to [facility] by his primary care provider for leukocytosis.  Stephen Moses returns to the [facility] today to review labs. He is accompanied by his wife.  He has no new complaints. He is here to review laboratory studies obtained at his last visit.    Review of Systems  Constitutional: Negative for fever, chills, weight loss, malaise/fatigue and diaphoresis.  HENT: Negative for congestion, ear pain, nosebleeds and sore throat.   Eyes: Negative for blurred vision.  Respiratory: Negative for cough, hemoptysis, sputum production, shortness of breath, wheezing and stridor.   Cardiovascular: Negative for chest pain, palpitations, claudication and leg swelling.  Gastrointestinal: Negative for heartburn, nausea, vomiting, abdominal pain, diarrhea, constipation, blood in stool and melena.  Genitourinary: Negative for dysuria, urgency, frequency and hematuria.  Musculoskeletal: Negative for myalgias and falls.  Skin: Negative for rash.  Neurological: Negative for dizziness, tremors, speech change, focal weakness, seizures, loss of consciousness, weakness and headaches.    Endo/Heme/Allergies: Positive for environmental allergies. Negative for polydipsia. Bruises/bleeds easily.  Psychiatric/Behavioral: Negative for depression and suicidal ideas. The patient is not nervous/anxious.     PAST MEDICAL HISTORY:   Past Medical History  Diagnosis Date  . Unspecified essential hypertension   . Dizziness   . Paraspinal muscle spasm   . Calculus of kidney   . AORTIC VALVE REPLACEMENT, HX OF 07/06/2009    Qualifier: Diagnosis of  By: Purcell Nails, NP, Curt Bears    . Ascending aortic aneurysm ([place]) 09/16/2011  . Long term current use of anticoagulant 05/30/2010  . Leukocytosis 05/17/2015  . Pancytopenia ([place]) 05/18/2015  . Normocytic anemia 05/18/2015  . Thrombocytopenia ([place]) 05/18/2015    ALLERGIES: Allergies  Allergen Reactions  . Codeine Nausea And Vomiting and Palpitations      MEDICATIONS: I have reviewed the patient's current medications.    Current Outpatient Prescriptions on File Prior to Visit  Medication Sig Dispense Refill  . acetaminophen (TYLENOL) 500 MG tablet Take 500 mg by mouth every 6 (six) hours as needed.    [name] amLODipine (NORVASC) 5 MG tablet Take 5 mg by mouth at bedtime.     . metoprolol succinate (TOPROL-XL) 25 MG 24 hr tablet Take 25 mg by mouth at bedtime.     [name] warfarin (COUMADIN) 10 MG tablet Take 1/2 tablet daily or as directed (Patient taking differently: Take 5 mg by mouth daily. Take 1/2 tablet daily or as directed) 90 tablet 3   No current facility-administered medications on file prior to visit.     PAST SURGICAL HISTORY Past Surgical History  Procedure Laterality Date  . Right shoulder surgery    . Back surgery  2009  . Colonoscopy    . Cardiac valve replacement    . Eye surgery    . Left rotator cuff repair    . Colonoscopy N/A 10/16/2014    Procedure: COLONOSCOPY;  Surgeon: Rogene Houston, MD;  Location: [facility];  Service: Endoscopy;  Laterality: N/A;  930    FAMILY HISTORY: Family History  Problem Relation Age  of Onset  . Leukemia Brother   . Heart attack Brother   . Lung cancer Brother   . Colon cancer Brother   . Leukemia Sister   He reports a brother with lung cancer, and another brother with leukemia.  He also has a sister with leukemia.   He has no children. His mother is deceased at the age of 72 from "old age." His father is deceased in his 35's while in jail for aiding and abetting an escaped convict after being in a jail fight.   SOCIAL HISTORY:  reports that he quit smoking about 21 years ago. His smoking use included Cigarettes. He started smoking about 56 years ago. He has a 87.5 pack-year smoking history. He has never used smokeless tobacco. He reports that he drinks alcohol. He reports that he does not use illicit drugs.  He is married x 7 years, having known his wife x 32 years.  He does not have children nor grandchildren.  He is [employer]  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  PERFORMANCE STATUS: The patient's performance status is 1 - Symptomatic but completely ambulatory  PHYSICAL EXAM: Most Recent Vital Signs: Blood pressure 141/67, pulse 63, temperature 97.8 F (36.6 C), temperature source Oral, resp. rate 18, weight 200 lb 9.6 oz (90.992 kg), SpO2 98 %. BP 141/67 mmHg  Pulse 63  Temp(Src) 97.8 F (36.6 C) (Oral)  Resp 18  Wt 200 lb 9.6 oz (90.992 kg)  SpO2 98%  General Appearance:    Alert, cooperative, no distress, appears older than stated age  Head:    Normocephalic, without obvious abnormality, atraumatic  Eyes:    Conjunctiva/corneas clear, EOM's intact        Throat:   Lips, mucosa, and tongue normal; upper dentures and gums normal  Neck:   Supple, symmetrical, trachea midline, no adenopathy;       thyroid:  No enlargement/tenderness/nodules; no carotid   bruit or JVD  Back:     Symmetric, no curvature, ROM normal, no CVA tenderness  Lungs:     Clear to auscultation bilaterally, respirations unlabored   Chest wall:    No tenderness or deformity  Heart:    Regular rate and rhythm, S1 and S2 normal, with a systolic click murmur from mechanical aortic valve replacement, and no rub or gallop  Abdomen:     Soft, non-tender, bowel sounds active all four quadrants,    no masses, no organomegaly  Extremities:   Extremities normal, atraumatic, no cyanosis or edema  Pulses:   2+ and symmetric all extremities  Skin:   Skin color, texture, turgor normal, no rashes or lesions  Lymph nodes:   Cervical, supraclavicular, and axillary nodes normal  Neurologic:   CNII-XII intact. Normal strength, sensation and reflexes      throughout    LABORATORY DATA:  I have reviewed the data as listed.  CBC    Component Value Date/Time   WBC 10.9* 05/18/2015 1255   RBC 4.62 05/18/2015 1255   RBC 4.62 05/18/2015 1255   HGB 13.1  05/18/2015 1255   HCT 39.3 05/18/2015 1255   PLT 250 05/18/2015 1255   MCV 85.1 05/18/2015 1255   MCH 28.4 05/18/2015 1255   MCHC 33.3 05/18/2015 1255   RDW 14.3 05/18/2015 1255   LYMPHSABS 2.8 05/18/2015 1255   MONOABS 0.7 05/18/2015 1255   EOSABS 0.3 05/18/2015 1255   BASOSABS 0.1 05/18/2015 1255      Chemistry      Component Value Date/Time   NA 138 04/01/2014 0608   K 4.2 04/01/2014 0608   CL 104 04/01/2014 0608   CO2 25 04/01/2014 0608   BUN 21 04/01/2014 0608   CREATININE 1.80* 06/01/2015 1226      Component Value Date/Time   CALCIUM 8.9 04/01/2014 0608   ALKPHOS 63 03/30/2014 1018   AST 39* 03/30/2014 1018   ALT 32 03/30/2014 1018   BILITOT 0.6 03/30/2014 1018     Results for Stephen, Moses (MRN [id number]) as of 06/12/2015 13:59  Ref. Range 05/18/2015 12:55  TSH Latest Ref Range: 0.350-4.500 uIU/mL 1.271   Results for Stephen, Moses (MRN [id number]) as of 06/12/2015 13:59  Ref. Range 05/18/2015 12:55  Haptoglobin Latest Ref Range: 34-200 mg/dL <10 (L)   Results for Stephen Moses, Stephen Moses (MRN [id number]) as of 06/12/2015 13:59  Ref. Range 05/18/2015 12:55   Erythropoietin Latest Ref Range: 2.6-18.5 mIU/mL 8.9  Sed Rate Latest Ref Range: 0-16 mm/hr 10   Results for Stephen, Moses (MRN [id number]) as of 06/12/2015 13:59  Ref. Range 05/18/2015 12:55  LDH Latest Ref Range: 98-192 U/L 245 (H)   BCR-ABL1    NEGATIVE JAK2 V617F, CALR, JAK 2 EXON 12, MPL MUTATION     NEGATIVE    RADIOGRAPHY:   CLINICAL DATA: Shortness of breath, wheezing and productive cough for 8 weeks. History of aneurysm repair and valve replacement surgery.  EXAM: CT CHEST WITHOUT CONTRAST  TECHNIQUE: Multidetector CT imaging of the chest was performed following the standard protocol without IV contrast.  COMPARISON: 03/31/2014  FINDINGS: Chest wall: No chest wall mass, supraclavicular or axillary lymphadenopathy. Small scattered lymph nodes are stable. The thyroid gland appears normal. The bony thorax is intact. Stable surgical changes with median sternotomy wires.  Mediastinum: The heart is normal in size and stable. No pericardial effusion. Stable borderline enlarged mediastinal and hilar lymph nodes but no mass. Stable surgical changes from aortic valve replacement surgery and ascending aortic graft. No complicating features. The esophagus is grossly normal.  Lungs/ pleura: Stable emphysematous changes and pulmonary scarring. No acute overlying pulmonary process. No worrisome pulmonary lesions. No bronchiectasis.  Upper abdomen: No significant findings.  IMPRESSION: 1. Stable emphysematous changes and pulmonary scarring. No acute overlying pulmonary process. 2. Stable borderline enlarged mediastinal and hilar lymph nodes. 3. Stable surgical changes from aortic valve replacement surgery and ascending aortic graft.   Electronically Signed  By: Marijo Sanes M.D.  On: 06/03/2014 16:55   PATHOLOGY:  N/A  ASSESSMENT/PLAN:  Leukocytosis Mechanical Aortic Valve Mild iron deficiency  All laboratory studies from his initial visit  were reviewed with the patient and his wife.  Currently I do not feel we need to pursue additional evaluation of his leukocytosis with BMBX.   I suspect he may have a "subclinical" hemolysis from his valve but is currently not anemic, mildly elevated LDH is noted as well as low haptoglobin.  I have recommended starting oral iron supplementation and [id number] and folic acid.  I will see him back in 4 months with repeat labs.  If his  leukocytosis worsens or he develops any symptoms of concern such as weight loss, night sweats etc we can consider BMBX at that time. Currently work up for MPD and CML are negative in addition flow cytometry is unremarkable.  Orders Placed This Encounter  Procedures  . CBC with Differential    Standing Status: Future     Number of Occurrences:      Standing Expiration Date: 06/11/2016  . Ferritin    Standing Status: Future     Number of Occurrences:      Standing Expiration Date: 06/11/2016  . Iron and TIBC    Standing Status: Future     Number of Occurrences:      Standing Expiration Date: 06/11/2016  . Vitamin B12    Standing Status: Future     Number of Occurrences:      Standing Expiration Date: 06/11/2016  . Folate    Standing Status: Future     Number of Occurrences:      Standing Expiration Date: 06/11/2016    All questions were answered. The patient knows to call the clinic with any problems, questions or concerns. We can certainly see the patient much sooner if necessary.  This document serves as a record of services personally performed by Ancil Linsey, MD. It was created on her behalf by Toni Amend, a trained medical scribe. The creation of this record is based on the scribe's personal observations and the provider's statements to them. This document has been checked and approved by the attending provider.  I have reviewed the above documentation for accuracy and completeness and I agree with the above.  This note is electronically signed by:    Molli Hazard, MD  06/12/2015 9:39 AM

## 2015-06-15 ENCOUNTER — Encounter: Payer: Self-pay | Admitting: Oncology

## 2015-06-15 ENCOUNTER — Ambulatory Visit (INDEPENDENT_AMBULATORY_CARE_PROVIDER_SITE_OTHER): Payer: Medicare Other | Admitting: *Deleted

## 2015-06-15 DIAGNOSIS — I359 Nonrheumatic aortic valve disorder, unspecified: Secondary | ICD-10-CM | POA: Diagnosis not present

## 2015-06-15 DIAGNOSIS — Z954 Presence of other heart-valve replacement: Secondary | ICD-10-CM

## 2015-06-15 DIAGNOSIS — Z5181 Encounter for therapeutic drug level monitoring: Secondary | ICD-10-CM

## 2015-06-15 DIAGNOSIS — Z952 Presence of prosthetic heart valve: Secondary | ICD-10-CM

## 2015-06-15 LAB — POCT INR: INR: 4.5

## 2015-07-01 ENCOUNTER — Ambulatory Visit (INDEPENDENT_AMBULATORY_CARE_PROVIDER_SITE_OTHER): Payer: Medicare Other | Admitting: *Deleted

## 2015-07-01 DIAGNOSIS — Z952 Presence of prosthetic heart valve: Secondary | ICD-10-CM

## 2015-07-01 DIAGNOSIS — I359 Nonrheumatic aortic valve disorder, unspecified: Secondary | ICD-10-CM

## 2015-07-01 DIAGNOSIS — Z954 Presence of other heart-valve replacement: Secondary | ICD-10-CM | POA: Diagnosis not present

## 2015-07-01 DIAGNOSIS — Z5181 Encounter for therapeutic drug level monitoring: Secondary | ICD-10-CM | POA: Diagnosis not present

## 2015-07-01 LAB — POCT INR: INR: 2.3

## 2015-07-16 ENCOUNTER — Encounter: Payer: Self-pay | Admitting: Cardiovascular Disease

## 2015-07-16 ENCOUNTER — Ambulatory Visit (INDEPENDENT_AMBULATORY_CARE_PROVIDER_SITE_OTHER): Payer: Medicare Other | Admitting: Cardiovascular Disease

## 2015-07-16 VITALS — BP 134/82 | HR 79 | Ht 68.0 in | Wt 201.0 lb

## 2015-07-16 DIAGNOSIS — I10 Essential (primary) hypertension: Secondary | ICD-10-CM | POA: Diagnosis not present

## 2015-07-16 DIAGNOSIS — Z8679 Personal history of other diseases of the circulatory system: Secondary | ICD-10-CM

## 2015-07-16 DIAGNOSIS — Z954 Presence of other heart-valve replacement: Secondary | ICD-10-CM | POA: Diagnosis not present

## 2015-07-16 DIAGNOSIS — Z9889 Other specified postprocedural states: Secondary | ICD-10-CM | POA: Diagnosis not present

## 2015-07-16 DIAGNOSIS — Z952 Presence of prosthetic heart valve: Secondary | ICD-10-CM

## 2015-07-16 LAB — POCT INR

## 2015-07-16 NOTE — Patient Instructions (Signed)

## 2015-07-16 NOTE — Progress Notes (Signed)
Patient ID: Stephen Moses, male   DOB: May 31, 1945, 70 y.o.   MRN: [id number]      SUBJECTIVE: The patient returns for routine follow-up. He has a history of bicuspid aortic valve and ascending aortic aneurysm for which he underwent aneurysm repair and mechanical aortic valve replacement in June 2010. He also has a history of hypertension.  ECG performed in the office today which I personally interpreted demonstrates sinus rhythm with a nonspecific T wave abnormality.  The patient denies any symptoms of chest pain, palpitations, shortness of breath, lightheadedness, dizziness, leg swelling, orthopnea, PND, and syncope.  He continues to use [employer] equipment at home and does bench presses, shoulder presses, sit ups, and curls. He also walks for 30 minutes every other day without difficulty.   Review of Systems: As per "subjective", otherwise negative.  Allergies  Allergen Reactions  . Codeine Nausea And Vomiting and Palpitations    Current Outpatient Prescriptions  Medication Sig Dispense Refill  . amLODipine (NORVASC) 5 MG tablet Take 5 mg by mouth at bedtime.     . cyanocobalamin 500 MCG tablet Take 500 mcg by mouth daily.    . folic acid (FOLVITE) A999333 MCG tablet Take 400 mcg by mouth daily.    . Iron Polysacch Cmplx-B12-FA 150-0.025-1 MG CAPS Take 1 capsule by mouth daily. 30 each 6  . metoprolol succinate (TOPROL-XL) 25 MG 24 hr tablet Take 25 mg by mouth at bedtime.     [name] warfarin (COUMADIN) 10 MG tablet Take 1/2 tablet daily or as directed (Patient taking differently: Take 5 mg by mouth daily. Take 1/2 tablet daily or as directed) 90 tablet 3   No current facility-administered medications for this visit.    Past Medical History  Diagnosis Date  . Unspecified essential hypertension   . Dizziness   . Paraspinal muscle spasm   . Calculus of kidney   . AORTIC VALVE REPLACEMENT, HX OF 07/06/2009    Qualifier: Diagnosis of  By: Purcell Nails, NP, Curt Bears    . Ascending aortic  aneurysm ([place]) 09/16/2011  . Long term current use of anticoagulant 05/30/2010  . Leukocytosis 05/17/2015  . Pancytopenia ([place]) 05/18/2015  . Normocytic anemia 05/18/2015  . Thrombocytopenia ([place]) 05/18/2015    Past Surgical History  Procedure Laterality Date  . Right shoulder surgery    . Back surgery  2009  . Colonoscopy    . Cardiac valve replacement    . Eye surgery    . Left rotator cuff repair    . Colonoscopy N/A 10/16/2014    Procedure: COLONOSCOPY;  Surgeon: Rogene Houston, MD;  Location: [facility];  Service: Endoscopy;  Laterality: N/A;  930    Social History   Social History  . Marital Status: Married    Spouse Name: N/A  . Number of Children: N/A  . Years of Education: N/A   Occupational History  . Not on file.   Social History Main Topics  . Smoking status: Former Smoker -- 2.50 packs/day for 35 years    Types: Cigarettes    Start date: 11/22/1958    Quit date: 11/21/1993  . Smokeless tobacco: Never Used  . Alcohol Use: 0.0 oz/week    0 Standard drinks or equivalent per week     Comment: occasional beer  . Drug Use: No  . Sexual Activity: Not on file   Other Topics Concern  . Not on file   Social History Narrative     Filed Vitals:   07/16/15 [phone]  BP: 134/82  Pulse: 79  Height: 5\' 8"  (1.727 m)  Weight: 201 lb (91.173 kg)  SpO2: 98%    PHYSICAL EXAM General: NAD HEENT: Normal. Neck: No JVD, no thyromegaly. Lungs: Clear to auscultation bilaterally with normal respiratory effort. CV: Nondisplaced PMI. Regular rate and rhythm, normal [id number] click, no [id number], soft I/VI systolic murmur over RUSB, no diastolic murmur. No pretibial or periankle edema. No carotid bruit. Abdomen: Soft, nontender, no distention.  Neurologic: Alert and oriented.  Psych: Normal affect. Skin: Normal. Musculoskeletal: No gross deformities.    ECG: Most recent ECG reviewed.      ASSESSMENT AND PLAN: 1. Bicuspid AV and ascending aortic aneurysm s/p  mechanical AVR and aneurysm repair in June 2010: Stable and asymptomatic. No diastolic murmur appreciated, indicating normal valve function. Continue warfarin. INR 2.3 on 07/01/15.  2. Essential HTN: Controlled on amlodipine 5 mg and Toprol-XL 25 mg daily. No changes.  Dispo: f/u 1 year.   [name], M.D., F.A.C.C.

## 2015-07-27 DIAGNOSIS — C44119 Basal cell carcinoma of skin of left eyelid, including canthus: Secondary | ICD-10-CM | POA: Diagnosis not present

## 2015-07-27 DIAGNOSIS — C44319 Basal cell carcinoma of skin of other parts of face: Secondary | ICD-10-CM | POA: Diagnosis not present

## 2015-07-29 ENCOUNTER — Ambulatory Visit (INDEPENDENT_AMBULATORY_CARE_PROVIDER_SITE_OTHER): Payer: Medicare Other | Admitting: *Deleted

## 2015-07-29 DIAGNOSIS — I359 Nonrheumatic aortic valve disorder, unspecified: Secondary | ICD-10-CM

## 2015-07-29 DIAGNOSIS — Z5181 Encounter for therapeutic drug level monitoring: Secondary | ICD-10-CM | POA: Diagnosis not present

## 2015-07-29 DIAGNOSIS — Z952 Presence of prosthetic heart valve: Secondary | ICD-10-CM

## 2015-07-29 DIAGNOSIS — Z954 Presence of other heart-valve replacement: Secondary | ICD-10-CM | POA: Diagnosis not present

## 2015-07-29 LAB — POCT INR: INR: 3.4

## 2015-07-31 DIAGNOSIS — M5416 Radiculopathy, lumbar region: Secondary | ICD-10-CM | POA: Diagnosis not present

## 2015-08-19 DIAGNOSIS — M5136 Other intervertebral disc degeneration, lumbar region: Secondary | ICD-10-CM | POA: Diagnosis not present

## 2015-08-26 ENCOUNTER — Ambulatory Visit (INDEPENDENT_AMBULATORY_CARE_PROVIDER_SITE_OTHER): Payer: Medicare Other | Admitting: *Deleted

## 2015-08-26 DIAGNOSIS — I359 Nonrheumatic aortic valve disorder, unspecified: Secondary | ICD-10-CM

## 2015-08-26 DIAGNOSIS — Z954 Presence of other heart-valve replacement: Secondary | ICD-10-CM

## 2015-08-26 DIAGNOSIS — Z5181 Encounter for therapeutic drug level monitoring: Secondary | ICD-10-CM

## 2015-08-26 DIAGNOSIS — Z952 Presence of prosthetic heart valve: Secondary | ICD-10-CM

## 2015-08-26 LAB — POCT INR: INR: 4.6

## 2015-09-09 ENCOUNTER — Ambulatory Visit (INDEPENDENT_AMBULATORY_CARE_PROVIDER_SITE_OTHER): Payer: Medicare Other | Admitting: *Deleted

## 2015-09-09 DIAGNOSIS — Z954 Presence of other heart-valve replacement: Secondary | ICD-10-CM | POA: Diagnosis not present

## 2015-09-09 DIAGNOSIS — I359 Nonrheumatic aortic valve disorder, unspecified: Secondary | ICD-10-CM

## 2015-09-09 DIAGNOSIS — Z952 Presence of prosthetic heart valve: Secondary | ICD-10-CM

## 2015-09-09 DIAGNOSIS — Z5181 Encounter for therapeutic drug level monitoring: Secondary | ICD-10-CM | POA: Diagnosis not present

## 2015-09-09 LAB — POCT INR: INR: 2.6

## 2015-09-11 ENCOUNTER — Encounter (HOSPITAL_COMMUNITY): Payer: Medicare Other | Attending: Hematology & Oncology | Admitting: Hematology & Oncology

## 2015-09-11 ENCOUNTER — Encounter (HOSPITAL_COMMUNITY): Payer: Medicare Other

## 2015-09-11 ENCOUNTER — Encounter (HOSPITAL_COMMUNITY): Payer: Self-pay | Admitting: Hematology & Oncology

## 2015-09-11 VITALS — BP 145/75 | HR 57 | Temp 97.4°F | Resp 18 | Wt 201.0 lb

## 2015-09-11 DIAGNOSIS — R790 Abnormal level of blood mineral: Secondary | ICD-10-CM

## 2015-09-11 DIAGNOSIS — D649 Anemia, unspecified: Secondary | ICD-10-CM

## 2015-09-11 DIAGNOSIS — D72829 Elevated white blood cell count, unspecified: Secondary | ICD-10-CM | POA: Diagnosis not present

## 2015-09-11 DIAGNOSIS — E538 Deficiency of other specified B group vitamins: Secondary | ICD-10-CM | POA: Diagnosis not present

## 2015-09-11 DIAGNOSIS — R799 Abnormal finding of blood chemistry, unspecified: Secondary | ICD-10-CM

## 2015-09-11 DIAGNOSIS — Z7901 Long term (current) use of anticoagulants: Secondary | ICD-10-CM

## 2015-09-11 DIAGNOSIS — Z952 Presence of prosthetic heart valve: Secondary | ICD-10-CM

## 2015-09-11 LAB — CBC WITH DIFFERENTIAL/PLATELET
BASOS ABS: 0 10*3/uL (ref 0.0–0.1)
BASOS PCT: 1 %
EOS ABS: 0.1 10*3/uL (ref 0.0–0.7)
EOS PCT: 2 %
HCT: 38.9 % — ABNORMAL LOW (ref 39.0–52.0)
Hemoglobin: 13 g/dL (ref 13.0–17.0)
LYMPHS ABS: 2 10*3/uL (ref 0.7–4.0)
LYMPHS PCT: 27 %
MCH: 28.8 pg (ref 26.0–34.0)
MCHC: 33.4 g/dL (ref 30.0–36.0)
MCV: 86.1 fL (ref 78.0–100.0)
MONOS PCT: 10 %
Monocytes Absolute: 0.8 10*3/uL (ref 0.1–1.0)
Neutro Abs: 4.7 10*3/uL (ref 1.7–7.7)
Neutrophils Relative %: 60 %
PLATELETS: 228 10*3/uL (ref 150–400)
RBC: 4.52 MIL/uL (ref 4.22–5.81)
RDW: 14.6 % (ref 11.5–15.5)
WBC: 7.7 10*3/uL (ref 4.0–10.5)

## 2015-09-11 LAB — IRON AND TIBC
IRON: 79 ug/dL (ref 45–182)
Saturation Ratios: 23 % (ref 17.9–39.5)
TIBC: 349 ug/dL (ref 250–450)
UIBC: 270 ug/dL

## 2015-09-11 LAB — FOLATE

## 2015-09-11 LAB — FERRITIN: FERRITIN: 105 ng/mL (ref 24–336)

## 2015-09-11 LAB — VITAMIN B12: Vitamin B-12: 804 pg/mL (ref 180–914)

## 2015-09-11 NOTE — Progress Notes (Signed)
[facility] Hematology/Oncology Progress Note  Name: Stephen Moses      MRN: [id number]   Date: 09/11/2015 Time:12:01 PM   REFERRING PHYSICIAN:  [name], MD (Primary Care Provider)  REASON FOR CONSULT:  Leukocytosis   DIAGNOSIS:  Leukocytosis with neutrophilia and monocytosis predominance dating back to at least 2009.  Additionally, he has a thrombocytopenia dating back to at least 2010.  He also has a normocytic, normochromic anemia dating back to at least 2010.  HISTORY OF PRESENT ILLNESS:   [name], who goes by the name Megan Salon, is a 70 year old white American man with a past medical history significant for AAA S/P graft, aortic valve disorder with aortic valve replacement, HTN, emphysema on CT imaging with a history of tobacco abuse, seasonal allergies who is referred to [facility] by his primary care provider for leukocytosis. Workup including flow cytometry, MPD evaluation and BCR-ABL is negative.   Mr. Barnhardt is accompanied by his wife. I personally reviewed and went over laboratory studies with the patient, including normal WBC today.  He is doing well. He recently returned from a trip to [place]. His appetite is normal. He denies any black, tarry, sticky stool or blood in stool. He has no major complaints at this time. No night sweats or CP.   He has not taken oral iron for the last two days because he wanted to see if he should continue on them. He has refills left on the oral iron but his co-pay is expensive.    Review of Systems  Constitutional: Negative for fever, chills, weight loss, malaise/fatigue and diaphoresis.  HENT: Negative for congestion, ear pain, nosebleeds and sore throat.   Eyes: Negative for blurred vision.  Respiratory: Negative for cough, hemoptysis, sputum production, shortness of breath, wheezing and stridor.   Cardiovascular: Negative for chest pain, palpitations, claudication and leg swelling.    Gastrointestinal: Negative for heartburn, nausea, vomiting, abdominal pain, diarrhea, constipation, blood in stool and melena.  Genitourinary: Negative for dysuria, urgency, frequency and hematuria.  Musculoskeletal: Negative for myalgias and falls.  Skin: Negative for rash.  Neurological: Negative for dizziness, tremors, speech change, focal weakness, seizures, loss of consciousness, weakness and headaches.  Endo/Heme/Allergies: Positive for environmental allergies. Negative for polydipsia. Bruises/bleeds easily when he bumps his arms.  Psychiatric/Behavioral: Negative for depression and suicidal ideas. The patient is not nervous/anxious.   14 point review of systems was performed and is negative except as detailed under history of present illness and above   PAST MEDICAL HISTORY:   Past Medical History  Diagnosis Date  . Unspecified essential hypertension   . Dizziness   . Paraspinal muscle spasm   . Calculus of kidney   . AORTIC VALVE REPLACEMENT, HX OF 07/06/2009    Qualifier: Diagnosis of  By: Purcell Nails, NP, Curt Bears    . Ascending aortic aneurysm ([place]) 09/16/2011  . Long term current use of anticoagulant 05/30/2010  . Leukocytosis 05/17/2015  . Pancytopenia ([place]) 05/18/2015  . Normocytic anemia 05/18/2015  . Thrombocytopenia ([place]) 05/18/2015    ALLERGIES: Allergies  Allergen Reactions  . Codeine Nausea And Vomiting and Palpitations      MEDICATIONS: I have reviewed the patient's current medications.    Current Outpatient Prescriptions on File Prior to Visit  Medication Sig Dispense Refill  . amLODipine (NORVASC) 5 MG tablet Take 5 mg by mouth at bedtime.     . cyanocobalamin 500 MCG tablet Take 500 mcg  by mouth daily.    . folic acid (FOLVITE) A999333 MCG tablet Take 400 mcg by mouth daily.    . Iron Polysacch Cmplx-B12-FA 150-0.025-1 MG CAPS Take 1 capsule by mouth daily. 30 each 6  . metoprolol succinate (TOPROL-XL) 25 MG 24 hr tablet Take 25 mg by mouth at bedtime.     [name]  warfarin (COUMADIN) 10 MG tablet Take 1/2 tablet daily or as directed (Patient taking differently: Take 5 mg by mouth daily. Take 1/2 tablet daily or as directed) 90 tablet 3   No current facility-administered medications on file prior to visit.     PAST SURGICAL HISTORY Past Surgical History  Procedure Laterality Date  . Right shoulder surgery    . Back surgery  2009  . Colonoscopy    . Cardiac valve replacement    . Eye surgery    . Left rotator cuff repair    . Colonoscopy N/A 10/16/2014    Procedure: COLONOSCOPY;  Surgeon: Rogene Houston, MD;  Location: [facility];  Service: Endoscopy;  Laterality: N/A;  930    FAMILY HISTORY: Family History  Problem Relation Age of Onset  . Leukemia Brother   . Heart attack Brother   . Lung cancer Brother   . Colon cancer Brother   . Leukemia Sister   He reports a brother with lung cancer, and another brother with leukemia.  He also has a sister with leukemia.   He has no children. His mother is deceased at the age of 46 from "old age." His father is deceased in his 2's while in jail for aiding and abetting an escaped convict after being in a jail fight.   SOCIAL HISTORY:  reports that he quit smoking about 21 years ago. His smoking use included Cigarettes. He started smoking about 56 years ago. He has a 87.5 pack-year smoking history. He has never used smokeless tobacco. He reports that he drinks alcohol. He reports that he does not use illicit drugs.  He is married x 7 years, having known his wife x 32 years.  He does not have children nor grandchildren.  He is [employer]  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  PERFORMANCE STATUS: The patient's performance status is 1 - Symptomatic but completely ambulatory  PHYSICAL EXAM: Most Recent Vital Signs: Blood pressure 145/75, pulse 57, temperature 97.4 F (36.3 C), temperature source Oral, resp. rate 18, weight 201 lb (91.173 kg), SpO2 98  %. BP 145/75 mmHg  Pulse 57  Temp(Src) 97.4 F (36.3 C) (Oral)  Resp 18  Wt 201 lb (91.173 kg)  SpO2 98%  General Appearance:    Alert, cooperative, no distress, appears older than stated age  Head:    Normocephalic, without obvious abnormality, atraumatic  Eyes:    Conjunctiva/corneas clear, EOM's intact        Throat:   Lips, mucosa, and tongue normal; upper dentures and gums normal  Neck:   Supple, symmetrical, trachea midline, no adenopathy;       thyroid:  No enlargement/tenderness/nodules; no carotid   bruit or JVD  Back:     Symmetric, no curvature, ROM normal, no CVA tenderness  Lungs:     Clear to auscultation bilaterally, respirations unlabored  Chest wall:    No tenderness or deformity  Heart:    Regular rate and rhythm, S1 and S2 normal, with a systolic click murmur from mechanical aortic valve replacement, and no rub or gallop  Abdomen:     Soft, non-tender, bowel sounds active all four quadrants,    no masses, no organomegaly  Extremities:   Extremities normal, atraumatic, no cyanosis or edema  Pulses:   2+ and symmetric all extremities  Skin:   Skin color, texture, turgor normal, no rashes or lesions  Lymph nodes:   Cervical, supraclavicular, and axillary nodes normal  Neurologic:   CNII-XII intact. Normal strength, sensation and reflexes      throughout    LABORATORY DATA:  I have reviewed the data as listed.  CBC    Component Value Date/Time   WBC 7.7 09/11/2015 0942   RBC 4.52 09/11/2015 0942   RBC 4.62 05/18/2015 1255   HGB 13.0 09/11/2015 0942   HCT 38.9* 09/11/2015 0942   PLT 228 09/11/2015 0942   MCV 86.1 09/11/2015 0942   MCH 28.8 09/11/2015 0942   MCHC 33.4 09/11/2015 0942   RDW 14.6 09/11/2015 0942   LYMPHSABS 2.0 09/11/2015 0942   MONOABS 0.8 09/11/2015 0942   EOSABS 0.1 09/11/2015 0942   BASOSABS 0.0 09/11/2015 0942      Chemistry      Component Value Date/Time   NA 138 04/01/2014 0608   K 4.2 04/01/2014 0608   CL 104 04/01/2014  0608   CO2 25 04/01/2014 0608   BUN 21 04/01/2014 0608   CREATININE 1.80* 06/01/2015 1226      Component Value Date/Time   CALCIUM 8.9 04/01/2014 0608   ALKPHOS 63 03/30/2014 1018   AST 39* 03/30/2014 1018   ALT 32 03/30/2014 1018   BILITOT 0.6 03/30/2014 1018      BCR-ABL1    NEGATIVE JAK2 V617F, CALR, JAK 2 EXON 12, MPL MUTATION     NEGATIVE    RADIOGRAPHY: I have personally reviewed the radiological images as listed and agreed with the findings in the report.  Study Result     CLINICAL DATA: Mediastinal lymphadenopathy.  EXAM: CT CHEST WITH CONTRAST  TECHNIQUE: Multidetector CT imaging of the chest was performed during intravenous contrast administration.  CONTRAST: 13mL ISOVUE-300 IOPAMIDOL (ISOVUE-300) INJECTION 61%  COMPARISON: 06/03/2014  FINDINGS: Mediastinum / Lymph Nodes: There is no axillary lymphadenopathy. 15 mm short axis precarinal lymph node is stable 8 mm AP window lymph node is also unchanged. Borderline subcarinal lymph node is stable. No new or progressive mediastinal lymphadenopathy. No gross hilar lymphadenopathy. Heart size is upper normal. No pericardial effusion. Coronary artery calcification is noted. Patient is status post aortic valve replacement. The esophagus has normal imaging features.  Lungs / Pleura: Centrilobular emphysema noted bilaterally. No suspicious pulmonary nodule or mass. No focal airspace consolidation. No pulmonary edema or pleural effusion.  Upper Abdomen: Unremarkable.  MSK / Soft Tissues: Bone windows reveal no worrisome lytic or sclerotic osseous lesions.  IMPRESSION: Stable upper normal to borderline increased mediastinal lymph nodes. No new or progressive disease.  Emphysema.   Electronically Signed  By: Misty Stanley M.D.  On: 06/01/2015 15:04      PATHOLOGY:  N/A  ASSESSMENT/PLAN:  Leukocytosis, work up to date unremarkable. Mechanical Aortic Valve Chronic  anticoagulation Mild iron deficiency Anemia, improved on oral iron History of mild mediastinal adenopathy, stable  All laboratory studies from his initial visit were reviewed with the patient and his wife again in detail.  Currently I do not feel we need to pursue additional evaluation of his leukocytosis with BMBX.   I suspect he may have a "subclinical" hemolysis from his valve but is currently not anemic, mildly elevated  LDH is noted as well as low haptoglobin.  I have recommended starting oral iron supplementation and [id number] and folic acid.He is on oral iron, ferritin is improved. He can stop his iron for now if he wishes. We will recheck moving forward.   Mr. Mccrea is accompanied by his wife. I personally reviewed and went over laboratory studies with the patient, including normal WBC. He will return for follow up in 6 months. If his blood counts continue to be normal, I will consider releasing him to his PCP with observation.  Orders Placed This Encounter  Procedures  . CBC with Differential    Standing Status:   Future    Standing Expiration Date:   09/10/2016  . Ferritin    Standing Status:   Future    Standing Expiration Date:   09/10/2016    All questions were answered. The patient knows to call the clinic with any problems, questions or concerns. We can certainly see the patient much sooner if necessary.  This document serves as a record of services personally performed by Ancil Linsey, MD. It was created on her behalf by Arlyce Harman, a trained medical scribe. The creation of this record is based on the scribe's personal observations and the provider's statements to them. This document has been checked and approved by the attending provider.  I have reviewed the above documentation for accuracy and completeness and I agree with the above.  This note is electronically signed by:  Molli Hazard, MD  09/11/2015 12:01 PM

## 2015-09-11 NOTE — Patient Instructions (Signed)
[place] at [facility] Discharge Instructions  RECOMMENDATIONS MADE BY THE CONSULTANT AND ANY TEST RESULTS WILL BE SENT TO YOUR REFERRING PHYSICIAN.  Exam done and seen today by Dr. Whitney [facility] 6 months Return to see the doctor in 6 months Please call the clinic if you have any questions or concerns   Thank you for choosing [place] at [facility] to provide your oncology and hematology care.  To afford each patient quality time with our provider, please arrive at least 15 minutes before your scheduled appointment time.   Beginning January 23rd 2017 lab work for the [facility] will be done in the  Main lab at [facility] on 1st floor. If you have a lab appointment with the [facility] please come in thru the  Main Entrance and check in at the main information desk  You need to re-schedule your appointment should you arrive 10 or more minutes late.  We strive to give you quality time with our providers, and arriving late affects you and other patients whose appointments are after yours.  Also, if you no show three or more times for appointments you may be dismissed from the clinic at the providers discretion.     Again, thank you for choosing [facility].  Our hope is that these requests will decrease the amount of time that you wait before being seen by our physicians.       _____________________________________________________________  Should you have questions after your visit to [facility], please contact our office at [phone] between the hours of 8:30 a.m. and 4:30 p.m.  Voicemails left after 4:30 p.m. will not be returned until the following business day.  For prescription refill requests, have your pharmacy contact our office.         Resources For Cancer Patients and their Caregivers ? American Cancer Society: Can assist with transportation, wigs, general needs, runs Look Good Feel Better.         305-445-7019 ? Cancer Care: Provides financial assistance, online support groups, medication/co-pay assistance.  1-800-813-HOPE 8085145350) ? [place] Assists [place] cancer patients and their families through emotional , educational and financial support.  431 535 9059 ? [place] DSS Where to apply for food stamps, Medicaid and utility assistance. (469) 569-8051 ? [facility]: Transportation to medical appointments. 984-685-7964 ? Social Security Administration: May apply for disability if have a Stage IV cancer. 7177302489 (616) 227-3982 ? [employer], Disability and Transit Services: Assists with nutrition, care and transit needs. [place] Support Programs: @10RELATIVEDAYS @ > Cancer Support Group  2nd Tuesday of the month 1pm-2pm, Journey Room  > Creative Journey  3rd Tuesday of the month 1130am-1pm, Journey Room  > Look Good Feel Better  1st Wednesday of the month 10am-12 noon, Journey Room (Call [place] to register [phone])

## 2015-09-14 ENCOUNTER — Encounter (HOSPITAL_COMMUNITY): Payer: Self-pay | Admitting: Hematology & Oncology

## 2015-09-17 DIAGNOSIS — K4091 Unilateral inguinal hernia, without obstruction or gangrene, recurrent: Secondary | ICD-10-CM | POA: Diagnosis not present

## 2015-10-01 DIAGNOSIS — K4090 Unilateral inguinal hernia, without obstruction or gangrene, not specified as recurrent: Secondary | ICD-10-CM | POA: Diagnosis not present

## 2015-10-07 ENCOUNTER — Ambulatory Visit (INDEPENDENT_AMBULATORY_CARE_PROVIDER_SITE_OTHER): Payer: Medicare Other | Admitting: *Deleted

## 2015-10-07 DIAGNOSIS — Z954 Presence of other heart-valve replacement: Secondary | ICD-10-CM

## 2015-10-07 DIAGNOSIS — Z5181 Encounter for therapeutic drug level monitoring: Secondary | ICD-10-CM | POA: Diagnosis not present

## 2015-10-07 DIAGNOSIS — I359 Nonrheumatic aortic valve disorder, unspecified: Secondary | ICD-10-CM | POA: Diagnosis not present

## 2015-10-07 DIAGNOSIS — Z952 Presence of prosthetic heart valve: Secondary | ICD-10-CM

## 2015-10-07 LAB — POCT INR: INR: 2.4

## 2015-10-08 ENCOUNTER — Encounter (HOSPITAL_COMMUNITY): Payer: Self-pay | Admitting: Hematology & Oncology

## 2015-10-14 DIAGNOSIS — C44311 Basal cell carcinoma of skin of nose: Secondary | ICD-10-CM | POA: Diagnosis not present

## 2015-10-28 ENCOUNTER — Telehealth: Payer: Self-pay | Admitting: *Deleted

## 2015-10-28 ENCOUNTER — Ambulatory Visit (INDEPENDENT_AMBULATORY_CARE_PROVIDER_SITE_OTHER): Payer: Medicare Other | Admitting: *Deleted

## 2015-10-28 DIAGNOSIS — Z23 Encounter for immunization: Secondary | ICD-10-CM | POA: Diagnosis not present

## 2015-10-28 DIAGNOSIS — I359 Nonrheumatic aortic valve disorder, unspecified: Secondary | ICD-10-CM

## 2015-10-28 DIAGNOSIS — Z5181 Encounter for therapeutic drug level monitoring: Secondary | ICD-10-CM

## 2015-10-28 DIAGNOSIS — Z952 Presence of prosthetic heart valve: Secondary | ICD-10-CM

## 2015-10-28 DIAGNOSIS — Z954 Presence of other heart-valve replacement: Secondary | ICD-10-CM | POA: Diagnosis not present

## 2015-10-28 LAB — POCT INR: INR: 3.5

## 2015-10-28 NOTE — Telephone Encounter (Signed)
Told pt to hold coumadin tomorrow night (thurs) and Sunday night take 1/2 tablet other days as ordered.  When finished Predpak restart 1/2 tablet daily.  INR appt moved up to 9/20.  Verbalized understanding.

## 2015-11-11 ENCOUNTER — Ambulatory Visit (INDEPENDENT_AMBULATORY_CARE_PROVIDER_SITE_OTHER): Payer: Medicare Other | Admitting: *Deleted

## 2015-11-11 DIAGNOSIS — Z5181 Encounter for therapeutic drug level monitoring: Secondary | ICD-10-CM

## 2015-11-11 DIAGNOSIS — I359 Nonrheumatic aortic valve disorder, unspecified: Secondary | ICD-10-CM | POA: Diagnosis not present

## 2015-11-11 DIAGNOSIS — Z954 Presence of other heart-valve replacement: Secondary | ICD-10-CM

## 2015-11-11 DIAGNOSIS — Z952 Presence of prosthetic heart valve: Secondary | ICD-10-CM

## 2015-11-11 LAB — POCT INR: INR: 2.5

## 2015-12-04 DIAGNOSIS — I10 Essential (primary) hypertension: Secondary | ICD-10-CM | POA: Diagnosis not present

## 2015-12-04 DIAGNOSIS — Z954 Presence of other heart-valve replacement: Secondary | ICD-10-CM | POA: Diagnosis not present

## 2015-12-09 ENCOUNTER — Ambulatory Visit (INDEPENDENT_AMBULATORY_CARE_PROVIDER_SITE_OTHER): Payer: Medicare Other | Admitting: *Deleted

## 2015-12-09 DIAGNOSIS — I359 Nonrheumatic aortic valve disorder, unspecified: Secondary | ICD-10-CM | POA: Diagnosis not present

## 2015-12-09 DIAGNOSIS — Z5181 Encounter for therapeutic drug level monitoring: Secondary | ICD-10-CM | POA: Diagnosis not present

## 2015-12-09 DIAGNOSIS — Z952 Presence of prosthetic heart valve: Secondary | ICD-10-CM

## 2015-12-09 LAB — POCT INR: INR: 4.3

## 2015-12-21 DIAGNOSIS — H04123 Dry eye syndrome of bilateral lacrimal glands: Secondary | ICD-10-CM | POA: Diagnosis not present

## 2015-12-21 DIAGNOSIS — H02105 Unspecified ectropion of left lower eyelid: Secondary | ICD-10-CM | POA: Diagnosis not present

## 2015-12-23 ENCOUNTER — Ambulatory Visit (INDEPENDENT_AMBULATORY_CARE_PROVIDER_SITE_OTHER): Payer: Medicare Other | Admitting: *Deleted

## 2015-12-23 DIAGNOSIS — Z5181 Encounter for therapeutic drug level monitoring: Secondary | ICD-10-CM | POA: Diagnosis not present

## 2015-12-23 DIAGNOSIS — Z952 Presence of prosthetic heart valve: Secondary | ICD-10-CM

## 2015-12-23 DIAGNOSIS — I359 Nonrheumatic aortic valve disorder, unspecified: Secondary | ICD-10-CM

## 2015-12-23 LAB — POCT INR: INR: 2.5

## 2016-01-20 ENCOUNTER — Ambulatory Visit (INDEPENDENT_AMBULATORY_CARE_PROVIDER_SITE_OTHER): Payer: Medicare Other | Admitting: *Deleted

## 2016-01-20 DIAGNOSIS — Z952 Presence of prosthetic heart valve: Secondary | ICD-10-CM | POA: Diagnosis not present

## 2016-01-20 DIAGNOSIS — I359 Nonrheumatic aortic valve disorder, unspecified: Secondary | ICD-10-CM | POA: Diagnosis not present

## 2016-01-20 DIAGNOSIS — Z5181 Encounter for therapeutic drug level monitoring: Secondary | ICD-10-CM | POA: Diagnosis not present

## 2016-01-20 LAB — POCT INR: INR: 4.5

## 2016-02-10 ENCOUNTER — Ambulatory Visit (INDEPENDENT_AMBULATORY_CARE_PROVIDER_SITE_OTHER): Payer: Medicare Other | Admitting: *Deleted

## 2016-02-10 DIAGNOSIS — Z952 Presence of prosthetic heart valve: Secondary | ICD-10-CM

## 2016-02-10 DIAGNOSIS — Z5181 Encounter for therapeutic drug level monitoring: Secondary | ICD-10-CM | POA: Diagnosis not present

## 2016-02-10 DIAGNOSIS — I359 Nonrheumatic aortic valve disorder, unspecified: Secondary | ICD-10-CM | POA: Diagnosis not present

## 2016-02-10 LAB — POCT INR: INR: 4.1

## 2016-02-19 ENCOUNTER — Encounter (HOSPITAL_COMMUNITY): Payer: Self-pay | Admitting: *Deleted

## 2016-02-19 ENCOUNTER — Emergency Department (HOSPITAL_COMMUNITY): Payer: Medicare Other

## 2016-02-19 ENCOUNTER — Emergency Department (HOSPITAL_COMMUNITY)
Admission: EM | Admit: 2016-02-19 | Discharge: 2016-02-20 | Disposition: A | Discharge status: Home / Self Care | Payer: Medicare Other | Attending: Emergency Medicine | Admitting: Emergency Medicine

## 2016-02-19 DIAGNOSIS — R0602 Shortness of breath: Secondary | ICD-10-CM | POA: Diagnosis not present

## 2016-02-19 DIAGNOSIS — Z87891 Personal history of nicotine dependence: Secondary | ICD-10-CM | POA: Diagnosis not present

## 2016-02-19 DIAGNOSIS — J111 Influenza due to unidentified influenza virus with other respiratory manifestations: Secondary | ICD-10-CM

## 2016-02-19 DIAGNOSIS — R5383 Other fatigue: Secondary | ICD-10-CM | POA: Diagnosis not present

## 2016-02-19 DIAGNOSIS — Z952 Presence of prosthetic heart valve: Secondary | ICD-10-CM | POA: Diagnosis not present

## 2016-02-19 DIAGNOSIS — R197 Diarrhea, unspecified: Secondary | ICD-10-CM | POA: Insufficient documentation

## 2016-02-19 DIAGNOSIS — Z79899 Other long term (current) drug therapy: Secondary | ICD-10-CM | POA: Insufficient documentation

## 2016-02-19 DIAGNOSIS — Z7901 Long term (current) use of anticoagulants: Secondary | ICD-10-CM | POA: Insufficient documentation

## 2016-02-19 DIAGNOSIS — I10 Essential (primary) hypertension: Secondary | ICD-10-CM | POA: Diagnosis not present

## 2016-02-19 DIAGNOSIS — R531 Weakness: Secondary | ICD-10-CM | POA: Diagnosis not present

## 2016-02-19 DIAGNOSIS — R69 Illness, unspecified: Secondary | ICD-10-CM

## 2016-02-19 DIAGNOSIS — R509 Fever, unspecified: Secondary | ICD-10-CM | POA: Diagnosis present

## 2016-02-19 LAB — URINALYSIS, ROUTINE W REFLEX MICROSCOPIC
BILIRUBIN URINE: NEGATIVE
Bacteria, UA: NONE SEEN
Glucose, UA: NEGATIVE mg/dL
Ketones, ur: NEGATIVE mg/dL
Leukocytes, UA: NEGATIVE
NITRITE: NEGATIVE
PH: 5 (ref 5.0–8.0)
Protein, ur: NEGATIVE mg/dL
SPECIFIC GRAVITY, URINE: 1.02 (ref 1.005–1.030)

## 2016-02-19 LAB — COMPREHENSIVE METABOLIC PANEL
ALT: 20 U/L (ref 17–63)
AST: 30 U/L (ref 15–41)
Albumin: 3.9 g/dL (ref 3.5–5.0)
Alkaline Phosphatase: 46 U/L (ref 38–126)
Anion gap: 6 (ref 5–15)
BUN: 20 mg/dL (ref 6–20)
CALCIUM: 8.5 mg/dL — AB (ref 8.9–10.3)
CHLORIDE: 100 mmol/L — AB (ref 101–111)
CO2: 23 mmol/L (ref 22–32)
CREATININE: 1.65 mg/dL — AB (ref 0.61–1.24)
GFR, EST AFRICAN AMERICAN: 47 mL/min — AB (ref >60)
GFR, EST NON AFRICAN AMERICAN: 41 mL/min — AB (ref >60)
Glucose, Bld: 139 mg/dL — ABNORMAL HIGH (ref 65–99)
Potassium: 3.7 mmol/L (ref 3.5–5.1)
Sodium: 129 mmol/L — ABNORMAL LOW (ref 135–145)
Total Bilirubin: 1 mg/dL (ref 0.3–1.2)
Total Protein: 7.2 g/dL (ref 6.5–8.1)

## 2016-02-19 LAB — CBC WITH DIFFERENTIAL/PLATELET
Basophils Absolute: 0.1 10*3/uL (ref 0.0–0.1)
Basophils Relative: 1 %
EOS PCT: 0 %
Eosinophils Absolute: 0 10*3/uL (ref 0.0–0.7)
HCT: 39.7 % (ref 39.0–52.0)
Hemoglobin: 13.6 g/dL (ref 13.0–17.0)
LYMPHS ABS: 0.9 10*3/uL (ref 0.7–4.0)
LYMPHS PCT: 10 %
MCH: 29.1 pg (ref 26.0–34.0)
MCHC: 34.3 g/dL (ref 30.0–36.0)
MCV: 85 fL (ref 78.0–100.0)
MONO ABS: 1.1 10*3/uL — AB (ref 0.1–1.0)
MONOS PCT: 12 %
Neutro Abs: 7 10*3/uL (ref 1.7–7.7)
Neutrophils Relative %: 77 %
PLATELETS: 193 10*3/uL (ref 150–400)
RBC: 4.67 MIL/uL (ref 4.22–5.81)
RDW: 14.5 % (ref 11.5–15.5)
WBC: 9.1 10*3/uL (ref 4.0–10.5)

## 2016-02-19 LAB — INFLUENZA PANEL BY PCR (TYPE A & B)
INFLBPCR: NEGATIVE
Influenza A By PCR: POSITIVE — AB

## 2016-02-19 LAB — PROTIME-INR
INR: 2.13
Prothrombin Time: 24.2 seconds — ABNORMAL HIGH (ref 11.4–15.2)

## 2016-02-19 MED ORDER — OSELTAMIVIR PHOSPHATE 75 MG PO CAPS
75.0000 mg | ORAL_CAPSULE | Freq: Once | ORAL | Status: AC
  Administered 2016-02-19: 75 mg via ORAL
  Filled 2016-02-19: qty 1

## 2016-02-19 MED ORDER — OSELTAMIVIR PHOSPHATE 30 MG PO CAPS: 30.0000 mg | ORAL_CAPSULE | Freq: Two times a day (BID) | ORAL | 0 refills | Status: AC

## 2016-02-19 MED ORDER — OSELTAMIVIR PHOSPHATE 30 MG PO CAPS
30.0000 mg | ORAL_CAPSULE | Freq: Once | ORAL | Status: DC
  Filled 2016-02-19: qty 1

## 2016-02-19 MED ORDER — ACETAMINOPHEN 325 MG PO TABS
ORAL_TABLET | ORAL | Status: AC
  Filled 2016-02-19: qty 1

## 2016-02-19 MED ORDER — ACETAMINOPHEN 325 MG PO TABS
650.0000 mg | ORAL_TABLET | ORAL | Status: DC | PRN
  Administered 2016-02-19: 650 mg via ORAL
  Filled 2016-02-19: qty 2

## 2016-02-19 MED ORDER — OSELTAMIVIR PHOSPHATE 75 MG PO CAPS: 75.0000 mg | ORAL_CAPSULE | Freq: Two times a day (BID) | ORAL | 0 refills | Status: DC

## 2016-02-19 MED ORDER — SODIUM CHLORIDE 0.9 % IV BOLUS (SEPSIS)
500.0000 mL | Freq: Once | INTRAVENOUS | Status: AC
  Administered 2016-02-19: 500 mL via INTRAVENOUS

## 2016-02-19 NOTE — ED Notes (Signed)
Pt complaint of nausea

## 2016-02-19 NOTE — ED Notes (Signed)
Physician in to assess 

## 2016-02-19 NOTE — ED Triage Notes (Signed)
Pt c/o generalized body aches, fever, nausea, diarrhea, cough, and sob since Wednesday.

## 2016-02-19 NOTE — ED Notes (Signed)
Pt report URI sx x several days- he has had a flu shot, is a former smoker and reports that he has had fever, chills since weds- His PCP is Dr Gerarda Fraction. He has diminished LS - Wife is at the bedside

## 2016-02-19 NOTE — ED Notes (Signed)
Physician in to assess pt 

## 2016-02-19 NOTE — ED Notes (Signed)
Dr Dolly Rias informed that pt has had 75 mg of tamiflu previously

## 2016-02-19 NOTE — ED Provider Notes (Signed)
[facility] Provider Note   CSN: [id number] Arrival date & time: 02/19/16  1924     History   Chief Complaint Chief Complaint  Patient presents with  . Generalized Body Aches    HPI Stephen Moses is a 70 y.o. male.  HPI Patient was recently exposed to family member with the flu. States he's had fever, chills, body aches, headache, cough and diarrhea for the past 2 days. He's taken out Tylenol at home. No new lower extremity swelling. No vomiting. States he's had 5-6 episodes of diarrhea for the past 2 days. Describes the stool is watery. No blood. Cough is nonproductive. Denies any urinary symptoms. Past Medical History:  Diagnosis Date  . AORTIC VALVE REPLACEMENT, HX OF 07/06/2009   Qualifier: Diagnosis of  By: Purcell Nails, NP, Curt Bears    . Ascending aortic aneurysm ([place]) 09/16/2011  . Calculus of kidney   . Dizziness   . Leukocytosis 05/17/2015  . Long term current use of anticoagulant 05/30/2010  . Normocytic anemia 05/18/2015  . Pancytopenia ([place]) 05/18/2015  . Paraspinal muscle spasm   . Thrombocytopenia ([place]) 05/18/2015  . Unspecified essential hypertension     Patient Active Problem List   Diagnosis Date Noted  . Normocytic anemia 05/18/2015  . Thrombocytopenia ([place]) 05/18/2015  . Leukocytosis 05/17/2015  . CAP (community acquired pneumonia) 03/30/2014  . Acute bronchitis 03/30/2014  . Essential hypertension 03/30/2014  . Acute kidney injury ([place]) 03/30/2014  . Lung density on x-ray 03/30/2014  . Encounter for therapeutic drug monitoring 03/28/2013  . Ascending aortic aneurysm ([place]) 09/16/2011  . Aortic valve disorder 05/30/2010  . Long term current use of anticoagulant 05/30/2010  . SYNCOPE 07/06/2009  . DIZZINESS 07/06/2009  . PALPITATIONS 07/06/2009  . H/O aortic valve replacement 07/06/2009  . HYPERTENSION 08/08/2008  . RENAL CALCULUS 08/08/2008    Past Surgical History:  Procedure Laterality Date  . back surgery  2009  . CARDIAC VALVE  REPLACEMENT    . COLONOSCOPY    . COLONOSCOPY N/A 10/16/2014   Procedure: COLONOSCOPY;  Surgeon: Rogene Houston, MD;  Location: [facility];  Service: Endoscopy;  Laterality: N/A;  930  . EYE SURGERY    . Left rotator cuff repair    . right shoulder surgery         Home Medications    Prior to Admission medications   Medication Sig Start Date End Date Taking? Authorizing Provider  acetaminophen (TYLENOL) 500 MG tablet Take 500 mg by mouth every 6 (six) hours as needed for mild pain or moderate pain.   Yes Historical Provider, MD  amLODipine (NORVASC) 5 MG tablet Take 5 mg by mouth at bedtime.    Yes Historical Provider, MD  cyanocobalamin 500 MCG tablet Take 500 mcg by mouth daily.   Yes Historical Provider, MD  Iron Polysacch Cmplx-B12-FA 150-0.025-1 MG CAPS Take 1 capsule by mouth daily. 06/12/15  Yes Patrici Ranks, MD  metoprolol succinate (TOPROL-XL) 25 MG 24 hr tablet Take 25 mg by mouth at bedtime.  06/06/12  Yes Fay Records, MD  warfarin (COUMADIN) 10 MG tablet Take 1/2 tablet daily or as directed Patient taking differently: Take 5 mg by mouth daily. Take 1/2 tablet daily or as directed 03/19/14  Yes Herminio Commons, MD  oseltamivir (TAMIFLU) 30 MG capsule Take 1 capsule (30 mg total) by mouth 2 (two) times daily. 02/19/16 02/24/16  Merrily Pew, MD  oseltamivir (TAMIFLU) 75 MG capsule Take 1 capsule (75 mg total) by  mouth every 12 (twelve) hours. 02/20/16   Julianne Rice, MD    Family History Family History  Problem Relation Age of Onset  . Leukemia Brother   . Heart attack Brother   . Lung cancer Brother   . Colon cancer Brother   . Leukemia Sister     Social History Social History  Substance Use Topics  . Smoking status: Former Smoker    Packs/day: 2.50    Years: 35.00    Types: Cigarettes    Start date: 11/22/1958    Quit date: 11/21/1993  . Smokeless tobacco: Never Used  . Alcohol use 0.0 oz/week     Comment: occasional beer     Allergies     Codeine   Review of Systems Review of Systems  Constitutional: Positive for activity change, chills, fatigue and fever.  HENT: Positive for congestion and sore throat.   Respiratory: Positive for cough. Negative for shortness of breath and wheezing.   Cardiovascular: Negative for chest pain, palpitations and leg swelling.  Gastrointestinal: Positive for diarrhea and nausea. Negative for abdominal pain, blood in stool, constipation and vomiting.  Genitourinary: Negative for difficulty urinating, dysuria, flank pain, frequency and hematuria.  Musculoskeletal: Positive for myalgias. Negative for arthralgias, neck pain and neck stiffness.  Skin: Negative for rash and wound.  Neurological: Positive for weakness (generalized), light-headedness and headaches. Negative for dizziness, syncope and numbness.  All other systems reviewed and are negative.    Physical Exam Updated Vital Signs BP 113/81 (BP Location: Left Arm)   Pulse 84   Temp 101 F (38.3 C) (Oral)   Resp 18   Ht 5\' 8"  (1.727 m)   Wt 200 lb (90.7 kg)   SpO2 96%   BMI 30.41 kg/m   Physical Exam  Constitutional: He is oriented to person, place, and time. He appears well-developed and well-nourished. No distress.  HENT:  Head: Normocephalic and atraumatic.  Mouth/Throat: Oropharynx is clear and moist. No oropharyngeal exudate.  Eyes: EOM are normal. Pupils are equal, round, and reactive to light.  Neck: Normal range of motion. Neck supple.  No meningismus  Cardiovascular: Normal rate and regular rhythm.  Exam reveals no gallop and no friction rub.   No murmur heard. Pulmonary/Chest: Effort normal and breath sounds normal. No respiratory distress. He has no wheezes. He has no rales. He exhibits no tenderness.  Abdominal: Soft. Bowel sounds are normal. There is no tenderness. There is no rebound and no guarding.  Musculoskeletal: Normal range of motion. He exhibits no edema or tenderness.  Lower extremity swelling or  asymmetry. No CVA tenderness. No midline thoracic or lumbar tenderness.  Neurological: He is alert and oriented to person, place, and time.  Moves all extremities without deficit. Sensation fully intact.  Skin: Skin is warm and dry. Capillary refill takes less than 2 seconds. No rash noted. No erythema.  Psychiatric: He has a normal mood and affect. His behavior is normal.  Nursing note and vitals reviewed.    ED Treatments / Results  Labs (all labs ordered are listed, but only abnormal results are displayed) Labs Reviewed  CBC WITH DIFFERENTIAL/PLATELET - Abnormal; Notable for the following:       Result Value   Monocytes Absolute 1.1 (*)    All other components within normal limits  COMPREHENSIVE METABOLIC PANEL - Abnormal; Notable for the following:    Sodium 129 (*)    Chloride 100 (*)    Glucose, Bld 139 (*)    Creatinine, Ser 1.65 (*)  Calcium 8.5 (*)    GFR calc non Af Amer 41 (*)    GFR calc Af Amer 47 (*)    All other components within normal limits  URINALYSIS, ROUTINE W REFLEX MICROSCOPIC - Abnormal; Notable for the following:    Hgb urine dipstick MODERATE (*)    All other components within normal limits  PROTIME-INR - Abnormal; Notable for the following:    Prothrombin Time 24.2 (*)    All other components within normal limits  INFLUENZA PANEL BY PCR (TYPE A & B, H1N1) - Abnormal; Notable for the following:    Influenza A By PCR POSITIVE (*)    All other components within normal limits    EKG  EKG Interpretation None       Radiology Dg Chest 2 View  Result Date: 02/19/2016 CLINICAL DATA:  Short of breath EXAM: CHEST  2 VIEW COMPARISON:  CT 06/01/2015 FINDINGS: Sternotomy wires overlie normal cardiac silhouette. No effusion, infiltrate pneumothorax. Chronic bronchitic markings. Midline sternotomy. IMPRESSION: No acute findings.  Chronic bronchitic markings. Electronically Signed   By: Suzy Bouchard M.D.   On: 02/19/2016 20:03     Procedures Procedures (including critical care time)  Medications Ordered in ED Medications  sodium chloride 0.9 % bolus 500 mL (0 mLs Intravenous Stopped 02/20/16 0000)  oseltamivir (TAMIFLU) capsule 75 mg (75 mg Oral Given 02/19/16 2110)     Initial Impression / Assessment and Plan / ED Course  I have reviewed the triage vital signs and the nursing notes.  Pertinent labs & imaging results that were available during my care of the patient were reviewed by me and considered in my medical decision making (see chart for details).  Clinical Course    Patient presents with recent flu exposure and flulike symptoms. He is well-appearing. Will give IV fluids, Tylenol, Tamiflu and reassess. Signed out to oncoming emergency physician pending reassessment and disposition. Final Clinical Impressions(s) / ED Diagnoses   Final diagnoses:  Influenza-like illness  Influenza    New Prescriptions Discharge Medication List as of 02/19/2016 11:17 PM    START taking these medications   Details  !! oseltamivir (TAMIFLU) 30 MG capsule Take 1 capsule (30 mg total) by mouth 2 (two) times daily., Starting Fri 02/19/2016, Until Wed 02/24/2016, Print    !! oseltamivir (TAMIFLU) 75 MG capsule Take 1 capsule (75 mg total) by mouth every 12 (twelve) hours., Starting Sat 02/20/2016, Print     !! - Potential duplicate medications found. Please discuss with provider.       Julianne Rice, MD 02/21/16 [phone]

## 2016-02-20 NOTE — ED Notes (Signed)
Pt dropped tylenol- override to replace

## 2016-03-02 ENCOUNTER — Ambulatory Visit (INDEPENDENT_AMBULATORY_CARE_PROVIDER_SITE_OTHER): Payer: Medicare Other | Admitting: *Deleted

## 2016-03-02 DIAGNOSIS — Z952 Presence of prosthetic heart valve: Secondary | ICD-10-CM | POA: Diagnosis not present

## 2016-03-02 DIAGNOSIS — Z5181 Encounter for therapeutic drug level monitoring: Secondary | ICD-10-CM | POA: Diagnosis not present

## 2016-03-02 DIAGNOSIS — I359 Nonrheumatic aortic valve disorder, unspecified: Secondary | ICD-10-CM

## 2016-03-02 LAB — POCT INR: INR: 3.6

## 2016-03-14 ENCOUNTER — Ambulatory Visit (HOSPITAL_COMMUNITY): Payer: Medicare Other | Admitting: Oncology

## 2016-03-14 ENCOUNTER — Other Ambulatory Visit (HOSPITAL_COMMUNITY): Payer: Medicare Other

## 2016-03-23 ENCOUNTER — Ambulatory Visit (INDEPENDENT_AMBULATORY_CARE_PROVIDER_SITE_OTHER): Payer: Medicare Other | Admitting: *Deleted

## 2016-03-23 DIAGNOSIS — Z952 Presence of prosthetic heart valve: Secondary | ICD-10-CM

## 2016-03-23 DIAGNOSIS — I359 Nonrheumatic aortic valve disorder, unspecified: Secondary | ICD-10-CM

## 2016-03-23 DIAGNOSIS — Z5181 Encounter for therapeutic drug level monitoring: Secondary | ICD-10-CM

## 2016-03-23 LAB — POCT INR: INR: 3.1

## 2016-04-01 ENCOUNTER — Other Ambulatory Visit (HOSPITAL_COMMUNITY): Payer: Medicare Other

## 2016-04-01 ENCOUNTER — Ambulatory Visit (HOSPITAL_COMMUNITY): Payer: Medicare Other | Admitting: Oncology

## 2016-04-18 ENCOUNTER — Encounter (HOSPITAL_BASED_OUTPATIENT_CLINIC_OR_DEPARTMENT_OTHER): Payer: Medicare Other | Admitting: Oncology

## 2016-04-18 ENCOUNTER — Encounter (HOSPITAL_COMMUNITY): Payer: Medicare Other | Attending: Oncology

## 2016-04-18 ENCOUNTER — Encounter (HOSPITAL_COMMUNITY): Payer: Self-pay | Admitting: Oncology

## 2016-04-18 VITALS — BP 129/79 | HR 58 | Temp 98.1°F | Resp 16 | Ht 68.0 in | Wt 204.0 lb

## 2016-04-18 DIAGNOSIS — D649 Anemia, unspecified: Secondary | ICD-10-CM

## 2016-04-18 DIAGNOSIS — D696 Thrombocytopenia, unspecified: Secondary | ICD-10-CM | POA: Insufficient documentation

## 2016-04-18 DIAGNOSIS — D72829 Elevated white blood cell count, unspecified: Secondary | ICD-10-CM

## 2016-04-18 LAB — CBC WITH DIFFERENTIAL/PLATELET
Basophils Absolute: 0 10*3/uL (ref 0.0–0.1)
Basophils Relative: 1 %
EOS ABS: 0.1 10*3/uL (ref 0.0–0.7)
EOS PCT: 2 %
HCT: 40.9 % (ref 39.0–52.0)
Hemoglobin: 13.7 g/dL (ref 13.0–17.0)
LYMPHS ABS: 1.9 10*3/uL (ref 0.7–4.0)
LYMPHS PCT: 26 %
MCH: 28.5 pg (ref 26.0–34.0)
MCHC: 33.5 g/dL (ref 30.0–36.0)
MCV: 85.2 fL (ref 78.0–100.0)
MONOS PCT: 7 %
Monocytes Absolute: 0.5 10*3/uL (ref 0.1–1.0)
Neutro Abs: 4.8 10*3/uL (ref 1.7–7.7)
Neutrophils Relative %: 64 %
PLATELETS: 198 10*3/uL (ref 150–400)
RBC: 4.8 MIL/uL (ref 4.22–5.81)
RDW: 14.4 % (ref 11.5–15.5)
WBC: 7.4 10*3/uL (ref 4.0–10.5)

## 2016-04-18 LAB — FERRITIN: FERRITIN: 56 ng/mL (ref 24–336)

## 2016-04-18 NOTE — Assessment & Plan Note (Addendum)
Leukocytosis with negative peripheral workup (bone marrow aspiration and biopsy not performed).  Labs today: CBC diff.  I personally reviewed and went over laboratory results with the patient.  The results are noted within this dictation.  CBC is within normal limits.  Labs in 9 months: CBC diff.  Return in 9 months.

## 2016-04-18 NOTE — Assessment & Plan Note (Signed)
Thrombocytopenia.  WNL today.

## 2016-04-18 NOTE — Assessment & Plan Note (Addendum)
Mild anemia likely secondary to mild iron deficiency with possible mild (subclinical) hemolysis from mechanical valve.  Good response to PO iron.  Not on PO iron at this time.  Labs today: CBC diff, ferritin.  I personally reviewed and went over laboratory results with the patient.  The results are noted within this dictation.  Labs in 9 months: CBC diff, BMET, iron/TIBC, ferritin, B12, folate.  Return in 9 months for follow-up.  Addendum: He is off PO iron replacement.  Ferritin has dropped slightly, but HGB is excellent.  I have recommended interim labs in 4 months.  If ferritin continues to decline/and/or HGB drops, I would recommend restarting PO iron replacement therapy (Rx-strength).

## 2016-04-18 NOTE — Patient Instructions (Addendum)
[facility] at [facility] Discharge Instructions  RECOMMENDATIONS MADE BY THE CONSULTANT AND ANY TEST RESULTS WILL BE SENT TO YOUR REFERRING PHYSICIAN.  You were seen today by Tom Kefalas PA-C. Return in 9 months for labs and follow up.    Thank you for choosing [facility] at [facility] to provide your oncology and hematology care.  To afford each patient quality time with our provider, please arrive at least 15 minutes before your scheduled appointment time.    If you have a lab appointment with the Cancer Center please come in thru the  Main Entrance and check in at the main information desk  You need to re-schedule your appointment should you arrive 10 or more minutes late.  We strive to give you quality time with our providers, and arriving late affects you and other patients whose appointments are after yours.  Also, if you no show three or more times for appointments you may be dismissed from the clinic at the providers discretion.     Again, thank you for choosing [facility].  Our hope is that these requests will decrease the amount of time that you wait before being seen by our physicians.       _____________________________________________________________  Should you have questions after your visit to [facility], please contact our office at [phone] between the hours of 8:30 a.m. and 4:30 p.m.  Voicemails left after 4:30 p.m. will not be returned until the following business day.  For prescription refill requests, have your pharmacy contact our office.       Resources For Cancer Patients and their Caregivers ? American Cancer Society: Can assist with transportation, wigs, general needs, runs Look Good Feel Better.        1-888-227-6333 ? Cancer Care: Provides financial assistance, online support groups, medication/co-pay assistance.  1-800-813-HOPE (4673) ? [facility] Assists [place] cancer patients and their families through emotional , educational and financial support.  336-427-4357 ? [place] DSS Where to apply for food stamps, Medicaid and utility assistance. 336-342-1394 ? [facility]: Transportation to medical appointments. 336-347-2287 ? Social Security Administration: May apply for disability if have a Stage IV cancer. 336-342-7796 1-800-772-1213 ? [place] Aging, Disability and Transit Services: Assists with nutrition, care and transit needs. 336-349-2343  Cancer Center Support Programs: [email or web address]@ > Cancer Support Group  2nd Tuesday of the month 1pm-2pm, Journey Room  > Creative Journey  3rd Tuesday of the month 1130am-1pm, Journey Room  > Look Good Feel Better  1st Wednesday of the month 10am-12 noon, Journey Room (Call American Cancer Society to register 1-800-395-5775)    

## 2016-04-18 NOTE — Progress Notes (Signed)
[name], MD [address] [place] [address]  Leukocytosis, unspecified type - Plan: CBC with Differential, Basic metabolic panel  Normocytic anemia - Plan: CBC with Differential, Vitamin B12, Folate, Iron and TIBC, Ferritin, CBC with Differential, Iron and TIBC, Ferritin  Thrombocytopenia (HCC) - Plan: CBC with Differential, Vitamin B12, Folate  CURRENT THERAPY:  Observation  INTERVAL HISTORY: Stephen Moses 71 y.o. male returns for followup of leukocytosis with negative peripheral workup (bone marrow aspiration and biopsy not performed). AND Mild anemia likely secondary to mild iron deficiency with possible mild (subclinical) hemolysis from mechanical valve.  Previously on PO iron with excellent response and tolerance, OFF PO replacement at this time.  He is doing very well.  He denies any hematology complaints.  He is no longer on oral iron replacement therapy excellent response.  He notes that he tolerated prescription strength iron well in the past without any difficulties.  He denies any blood in his stool or black tarry stool.  He denies any blood loss to his knowledge.  He denies any epistaxis, hemoptysis, and gross hematuria.  Review of Systems  Constitutional: Negative.  Negative for chills, fever and weight loss.  HENT: Negative.   Eyes: Negative.   Respiratory: Negative.  Negative for cough.   Cardiovascular: Negative.  Negative for chest pain.  Gastrointestinal: Negative.  Negative for blood in stool, constipation, diarrhea, melena, nausea and vomiting.  Genitourinary: Negative.   Musculoskeletal: Negative.   Skin: Negative.   Neurological: Negative.  Negative for weakness.  Endo/Heme/Allergies: Negative.   Psychiatric/Behavioral: Negative.     Past Medical History:  Diagnosis Date  . AORTIC VALVE REPLACEMENT, HX OF 07/06/2009   Qualifier: Diagnosis of  By: Purcell Nails, NP, Curt Bears    . Ascending aortic aneurysm ([place]) 09/16/2011  .  Calculus of kidney   . Dizziness   . Leukocytosis 05/17/2015  . Long term current use of anticoagulant 05/30/2010  . Normocytic anemia 05/18/2015  . Pancytopenia ([place]) 05/18/2015  . Paraspinal muscle spasm   . Thrombocytopenia ([place]) 05/18/2015  . Unspecified essential hypertension     Past Surgical History:  Procedure Laterality Date  . back surgery  2009  . CARDIAC VALVE REPLACEMENT    . COLONOSCOPY    . COLONOSCOPY N/A 10/16/2014   Procedure: COLONOSCOPY;  Surgeon: Rogene Houston, MD;  Location[facility];  Service: Endoscopy;  Laterality: N/A;  930  . EYE SURGERY    . Left rotator cuff repair    . right shoulder surgery      Family History  Problem Relation Age of Onset  . Leukemia Brother   . Heart attack Brother   . Lung cancer Brother   . Colon cancer Brother   . Leukemia Sister     Social History   Social History  . Marital status: Married    Spouse name: N/A  . Number of children: N/A  . Years of education: N/A   Social History Main Topics  . Smoking status: Former Smoker    Packs/day: 2.50    Years: 35.00    Types: Cigarettes    Start date: 11/22/1958    Quit date: 11/21/1993  . Smokeless tobacco: Never Used  . Alcohol use 0.0 oz/week     Comment: occasional beer  . Drug use: No  . Sexual activity: Not Asked   Other Topics Concern  . None   Social History Narrative  . None     PHYSICAL EXAMINATION  ECOG PERFORMANCE STATUS: 0 - Asymptomatic  Vitals:   04/18/16 1043  BP: 129/79  Pulse: (!) 58  Resp: 16  Temp: 98.1 F (36.7 C)    GENERAL:alert, no distress, well nourished, well developed, comfortable, cooperative, smiling and accompanied by wife. SKIN: skin color, texture, turgor are normal, no rashes or significant lesions HEAD: Normocephalic, No masses, lesions, tenderness or abnormalities EYES: normal, EOMI, Conjunctiva are pink and non-injected EARS: External ears normal OROPHARYNX:lips, buccal mucosa, and tongue normal and mucous  membranes are moist  NECK: supple, no adenopathy, trachea midline LYMPH:  no palpable lymphadenopathy BREAST:not examined LUNGS: clear to auscultation and percussion HEART: regular rate & rhythm and soft systolic murmur heard best at RSB 2nd ICS. ABDOMEN:abdomen soft, non-tender and normal bowel sounds BACK: Back symmetric, no curvature. EXTREMITIES:less then 2 second capillary refill, no joint deformities, effusion, or inflammation, no skin discoloration, no cyanosis  NEURO: alert & oriented x 3 with fluent speech, no focal motor/sensory deficits, gait normal   LABORATORY DATA: CBC    Component Value Date/Time   WBC 7.4 04/18/2016 0933   RBC 4.80 04/18/2016 0933   HGB 13.7 04/18/2016 0933   HCT 40.9 04/18/2016 0933   PLT 198 04/18/2016 0933   MCV 85.2 04/18/2016 0933   MCH 28.5 04/18/2016 0933   MCHC 33.5 04/18/2016 0933   RDW 14.4 04/18/2016 0933   LYMPHSABS 1.9 04/18/2016 0933   MONOABS 0.5 04/18/2016 0933   EOSABS 0.1 04/18/2016 0933   BASOSABS 0.0 04/18/2016 0933      Chemistry      Component Value Date/Time   NA 129 (L) 02/19/2016 1941   K 3.7 02/19/2016 1941   CL 100 (L) 02/19/2016 1941   CO2 23 02/19/2016 1941   BUN 20 02/19/2016 1941   CREATININE 1.65 (H) 02/19/2016 1941      Component Value Date/Time   CALCIUM 8.5 (L) 02/19/2016 1941   ALKPHOS 46 02/19/2016 1941   AST 30 02/19/2016 1941   ALT 20 02/19/2016 1941   BILITOT 1.0 02/19/2016 1941     Lab Results  Component Value Date   IRON 79 09/11/2015   TIBC 349 09/11/2015   FERRITIN 56 04/18/2016   Lab Results  Component Value Date   [id number] 09/11/2015   Lab Results  Component Value Date   FOLATE >100.0 09/11/2015    PENDING LABS:   RADIOGRAPHIC STUDIES:  No results found.   PATHOLOGY:    ASSESSMENT AND PLAN:  Leukocytosis Leukocytosis with negative peripheral workup (bone marrow aspiration and biopsy not performed).  Labs today: CBC diff.  I personally reviewed and went  over laboratory results with the patient.  The results are noted within this dictation.  CBC is within normal limits.  Labs in 9 months: CBC diff.  Return in 9 months.  Normocytic anemia Mild anemia likely secondary to mild iron deficiency with possible mild (subclinical) hemolysis from mechanical valve.  Good response to PO iron.  Not on PO iron at this time.  Labs today: CBC diff, ferritin.  I personally reviewed and went over laboratory results with the patient.  The results are noted within this dictation.  Labs in 9 months: CBC diff, BMET, iron/TIBC, ferritin, B12, folate.  Return in 9 months for follow-up.  Addendum: He is off PO iron replacement.  Ferritin has dropped slightly, but HGB is excellent.  I have recommended interim labs in 4 months.  If ferritin continues to decline/and/or HGB drops, I would recommend restarting PO iron replacement  therapy (Rx-strength).  Thrombocytopenia (HCC) Thrombocytopenia.  WNL today.   ORDERS PLACED FOR THIS ENCOUNTER: Orders Placed This Encounter  Procedures  . CBC with Differential  . Basic metabolic panel  . Vitamin B12  . Folate  . Iron and TIBC  . Ferritin  . CBC with Differential  . Iron and TIBC  . Ferritin    MEDICATIONS PRESCRIBED THIS ENCOUNTER: No orders of the defined types were placed in this encounter.   THERAPY PLAN:  Ongoing observation of counts and iron studies.  Continue to HOLD PO iron.  If ferritin/anemia worsens, would consider restarting PO iron (Rx-strength).  All questions were answered. The patient knows to call the clinic with any problems, questions or concerns. We can certainly see the patient much sooner if necessary.  Patient and plan discussed with Dr. Twana First and she is in agreement with the aforementioned.   This note is electronically signed by: Doy Mince 04/18/2016 5:09 PM

## 2016-04-19 ENCOUNTER — Other Ambulatory Visit (HOSPITAL_COMMUNITY): Payer: Medicare Other

## 2016-04-19 ENCOUNTER — Ambulatory Visit (HOSPITAL_COMMUNITY): Payer: Medicare Other | Admitting: Oncology

## 2016-04-20 ENCOUNTER — Ambulatory Visit (INDEPENDENT_AMBULATORY_CARE_PROVIDER_SITE_OTHER): Payer: Medicare Other | Admitting: *Deleted

## 2016-04-20 DIAGNOSIS — I359 Nonrheumatic aortic valve disorder, unspecified: Secondary | ICD-10-CM

## 2016-04-20 DIAGNOSIS — Z952 Presence of prosthetic heart valve: Secondary | ICD-10-CM

## 2016-04-20 DIAGNOSIS — Z5181 Encounter for therapeutic drug level monitoring: Secondary | ICD-10-CM | POA: Diagnosis not present

## 2016-04-20 LAB — POCT INR: INR: 3

## 2016-05-18 ENCOUNTER — Ambulatory Visit (INDEPENDENT_AMBULATORY_CARE_PROVIDER_SITE_OTHER): Payer: Medicare Other | Admitting: *Deleted

## 2016-05-18 DIAGNOSIS — Z952 Presence of prosthetic heart valve: Secondary | ICD-10-CM | POA: Diagnosis not present

## 2016-05-18 DIAGNOSIS — I359 Nonrheumatic aortic valve disorder, unspecified: Secondary | ICD-10-CM

## 2016-05-18 DIAGNOSIS — Z5181 Encounter for therapeutic drug level monitoring: Secondary | ICD-10-CM

## 2016-05-18 LAB — POCT INR: INR: 2.8

## 2016-05-29 ENCOUNTER — Emergency Department (HOSPITAL_COMMUNITY): Payer: Medicare Other

## 2016-05-29 ENCOUNTER — Encounter (HOSPITAL_COMMUNITY): Payer: Self-pay | Admitting: *Deleted

## 2016-05-29 ENCOUNTER — Inpatient Hospital Stay (HOSPITAL_COMMUNITY)
Admission: EM | Admit: 2016-05-29 | Discharge: 2016-06-01 | DRG: 392 | Disposition: A | Discharge status: Home / Self Care | Payer: Medicare Other | Attending: Internal Medicine | Admitting: Internal Medicine

## 2016-05-29 DIAGNOSIS — Z79899 Other long term (current) drug therapy: Secondary | ICD-10-CM

## 2016-05-29 DIAGNOSIS — Z87442 Personal history of urinary calculi: Secondary | ICD-10-CM

## 2016-05-29 DIAGNOSIS — Z87891 Personal history of nicotine dependence: Secondary | ICD-10-CM

## 2016-05-29 DIAGNOSIS — I129 Hypertensive chronic kidney disease with stage 1 through stage 4 chronic kidney disease, or unspecified chronic kidney disease: Secondary | ICD-10-CM | POA: Diagnosis present

## 2016-05-29 DIAGNOSIS — R791 Abnormal coagulation profile: Secondary | ICD-10-CM | POA: Diagnosis present

## 2016-05-29 DIAGNOSIS — I359 Nonrheumatic aortic valve disorder, unspecified: Secondary | ICD-10-CM | POA: Diagnosis present

## 2016-05-29 DIAGNOSIS — K5732 Diverticulitis of large intestine without perforation or abscess without bleeding: Secondary | ICD-10-CM | POA: Diagnosis present

## 2016-05-29 DIAGNOSIS — I712 Thoracic aortic aneurysm, without rupture: Secondary | ICD-10-CM | POA: Diagnosis not present

## 2016-05-29 DIAGNOSIS — K5733 Diverticulitis of large intestine without perforation or abscess with bleeding: Secondary | ICD-10-CM | POA: Diagnosis not present

## 2016-05-29 DIAGNOSIS — N183 Chronic kidney disease, stage 3 (moderate): Secondary | ICD-10-CM | POA: Diagnosis present

## 2016-05-29 DIAGNOSIS — K625 Hemorrhage of anus and rectum: Secondary | ICD-10-CM | POA: Diagnosis present

## 2016-05-29 DIAGNOSIS — Z8249 Family history of ischemic heart disease and other diseases of the circulatory system: Secondary | ICD-10-CM

## 2016-05-29 DIAGNOSIS — Z8679 Personal history of other diseases of the circulatory system: Secondary | ICD-10-CM

## 2016-05-29 DIAGNOSIS — Z885 Allergy status to narcotic agent status: Secondary | ICD-10-CM

## 2016-05-29 DIAGNOSIS — K559 Vascular disorder of intestine, unspecified: Secondary | ICD-10-CM | POA: Diagnosis present

## 2016-05-29 DIAGNOSIS — K5792 Diverticulitis of intestine, part unspecified, without perforation or abscess without bleeding: Secondary | ICD-10-CM | POA: Diagnosis present

## 2016-05-29 DIAGNOSIS — K529 Noninfective gastroenteritis and colitis, unspecified: Principal | ICD-10-CM | POA: Diagnosis present

## 2016-05-29 DIAGNOSIS — Q231 Congenital insufficiency of aortic valve: Secondary | ICD-10-CM

## 2016-05-29 DIAGNOSIS — Z952 Presence of prosthetic heart valve: Secondary | ICD-10-CM

## 2016-05-29 DIAGNOSIS — Z7901 Long term (current) use of anticoagulants: Secondary | ICD-10-CM

## 2016-05-29 DIAGNOSIS — I7121 Aneurysm of the ascending aorta, without rupture: Secondary | ICD-10-CM | POA: Diagnosis present

## 2016-05-29 LAB — I-STAT CHEM 8, ED
BUN: 26 mg/dL — ABNORMAL HIGH (ref 6–20)
Calcium, Ion: 1.14 mmol/L — ABNORMAL LOW (ref 1.15–1.40)
Chloride: 108 mmol/L (ref 101–111)
Creatinine, Ser: 1.7 mg/dL — ABNORMAL HIGH (ref 0.61–1.24)
GLUCOSE: 98 mg/dL (ref 65–99)
HEMATOCRIT: 43 % (ref 39.0–52.0)
HEMOGLOBIN: 14.6 g/dL (ref 13.0–17.0)
POTASSIUM: 3.8 mmol/L (ref 3.5–5.1)
Sodium: 141 mmol/L (ref 135–145)
TCO2: 26 mmol/L (ref 0–100)

## 2016-05-29 LAB — CBC WITH DIFFERENTIAL/PLATELET
BASOS ABS: 0.1 10*3/uL (ref 0.0–0.1)
BASOS PCT: 0 %
EOS PCT: 1 %
Eosinophils Absolute: 0.1 10*3/uL (ref 0.0–0.7)
HCT: 42.3 % (ref 39.0–52.0)
Hemoglobin: 14.4 g/dL (ref 13.0–17.0)
LYMPHS PCT: 22 %
Lymphs Abs: 2.9 10*3/uL (ref 0.7–4.0)
MCH: 28.9 pg (ref 26.0–34.0)
MCHC: 34 g/dL (ref 30.0–36.0)
MCV: 84.9 fL (ref 78.0–100.0)
Monocytes Absolute: 1.3 10*3/uL — ABNORMAL HIGH (ref 0.1–1.0)
Monocytes Relative: 10 %
NEUTROS ABS: 8.9 10*3/uL — AB (ref 1.7–7.7)
Neutrophils Relative %: 67 %
PLATELETS: 259 10*3/uL (ref 150–400)
RBC: 4.98 MIL/uL (ref 4.22–5.81)
RDW: 14.7 % (ref 11.5–15.5)
WBC: 13.3 10*3/uL — AB (ref 4.0–10.5)

## 2016-05-29 LAB — COMPREHENSIVE METABOLIC PANEL
ALBUMIN: 4.3 g/dL (ref 3.5–5.0)
ALT: 22 U/L (ref 17–63)
AST: 29 U/L (ref 15–41)
Alkaline Phosphatase: 56 U/L (ref 38–126)
Anion gap: 8 (ref 5–15)
BUN: 23 mg/dL — AB (ref 6–20)
CHLORIDE: 107 mmol/L (ref 101–111)
CO2: 24 mmol/L (ref 22–32)
CREATININE: 1.66 mg/dL — AB (ref 0.61–1.24)
Calcium: 9.4 mg/dL (ref 8.9–10.3)
GFR calc Af Amer: 47 mL/min — ABNORMAL LOW (ref >60)
GFR, EST NON AFRICAN AMERICAN: 40 mL/min — AB (ref >60)
GLUCOSE: 99 mg/dL (ref 65–99)
Potassium: 3.6 mmol/L (ref 3.5–5.1)
SODIUM: 139 mmol/L (ref 135–145)
Total Bilirubin: 0.4 mg/dL (ref 0.3–1.2)
Total Protein: 7.8 g/dL (ref 6.5–8.1)

## 2016-05-29 LAB — PROTIME-INR
INR: >10.01
Prothrombin Time: 85.8 seconds — ABNORMAL HIGH (ref 11.4–15.2)

## 2016-05-29 MED ORDER — SODIUM CHLORIDE 0.9 % IV BOLUS (SEPSIS)
500.0000 mL | Freq: Once | INTRAVENOUS | Status: AC
  Administered 2016-05-29: 500 mL via INTRAVENOUS

## 2016-05-29 MED ORDER — IOPAMIDOL (ISOVUE-300) INJECTION 61%
100.0000 mL | Freq: Once | INTRAVENOUS | Status: AC | PRN
  Administered 2016-05-29: 100 mL via INTRAVENOUS

## 2016-05-29 MED ORDER — CIPROFLOXACIN IN D5W 400 MG/200ML IV SOLN
400.0000 mg | Freq: Once | INTRAVENOUS | Status: AC
  Administered 2016-05-29: 400 mg via INTRAVENOUS
  Filled 2016-05-29: qty 200

## 2016-05-29 MED ORDER — VITAMIN K1 10 MG/ML IJ SOLN
10.0000 mg | Freq: Once | INTRAMUSCULAR | Status: AC
  Administered 2016-05-29: 10 mg via SUBCUTANEOUS
  Filled 2016-05-29: qty 1

## 2016-05-29 MED ORDER — METRONIDAZOLE IN NACL 5-0.79 MG/ML-% IV SOLN
500.0000 mg | Freq: Once | INTRAVENOUS | Status: AC
  Administered 2016-05-29: 500 mg via INTRAVENOUS
  Filled 2016-05-29: qty 100

## 2016-05-29 NOTE — ED Triage Notes (Signed)
Pt reports having a pinkish mucous coming from his rectum since this morning. Pt also reports llq abdominal pain.

## 2016-05-29 NOTE — ED Provider Notes (Signed)
[facility] DEPT Provider Note   CSN: [id number] Arrival date & time: 05/29/16  2016     History   Chief Complaint Chief Complaint  Patient presents with  . Rectal Bleeding    HPI Stephen Moses is a 71 y.o. male.  Patient states that he has had several bouts of  red blood per rectum today   The history is provided by the patient. No language interpreter was used.  Rectal Bleeding  Quality:  Maroon Amount:  Moderate Timing:  Constant Context: not anal fissures   Similar prior episodes: no   Relieved by:  Nothing Worsened by:  Nothing Ineffective treatments:  None tried Associated symptoms: abdominal pain     Past Medical History:  Diagnosis Date  . AORTIC VALVE REPLACEMENT, HX OF 07/06/2009   Qualifier: Diagnosis of  By: Purcell Nails, NP, Curt Bears    . Ascending aortic aneurysm ([place]) 09/16/2011  . Calculus of kidney   . Dizziness   . Leukocytosis 05/17/2015  . Long term current use of anticoagulant 05/30/2010  . Normocytic anemia 05/18/2015  . Pancytopenia ([place]) 05/18/2015  . Paraspinal muscle spasm   . Thrombocytopenia ([place]) 05/18/2015  . Unspecified essential hypertension     Patient Active Problem List   Diagnosis Date Noted  . Normocytic anemia 05/18/2015  . Thrombocytopenia ([place]) 05/18/2015  . Leukocytosis 05/17/2015  . CAP (community acquired pneumonia) 03/30/2014  . Acute bronchitis 03/30/2014  . Essential hypertension 03/30/2014  . Acute kidney injury ([place]) 03/30/2014  . Lung density on x-ray 03/30/2014  . Encounter for therapeutic drug monitoring 03/28/2013  . Ascending aortic aneurysm ([place]) 09/16/2011  . Aortic valve disorder 05/30/2010  . Long term current use of anticoagulant 05/30/2010  . SYNCOPE 07/06/2009  . DIZZINESS 07/06/2009  . PALPITATIONS 07/06/2009  . H/O aortic valve replacement 07/06/2009  . HYPERTENSION 08/08/2008  . RENAL CALCULUS 08/08/2008    Past Surgical History:  Procedure Laterality Date  . back surgery  2009  . CARDIAC  VALVE REPLACEMENT    . COLONOSCOPY    . COLONOSCOPY N/A 10/16/2014   Procedure: COLONOSCOPY;  Surgeon: Rogene Houston, MD;  Location: [facility];  Service: Endoscopy;  Laterality: N/A;  930  . EYE SURGERY    . Left rotator cuff repair    . right shoulder surgery         Home Medications    Prior to Admission medications   Medication Sig Start Date End Date Taking? Authorizing Provider  acetaminophen (TYLENOL) 500 MG tablet Take 500 mg by mouth every 6 (six) hours as needed for mild pain or moderate pain.    Historical Provider, MD  amLODipine (NORVASC) 5 MG tablet Take 5 mg by mouth at bedtime.     Historical Provider, MD  metoprolol succinate (TOPROL-XL) 25 MG 24 hr tablet Take 25 mg by mouth at bedtime.  06/06/12   Fay Records, MD  warfarin (COUMADIN) 10 MG tablet Take 1/2 tablet daily or as directed Patient taking differently: Take 5 mg by mouth daily. Take 1/2 tablet daily or as directed 03/19/14   Herminio Commons, MD    Family History Family History  Problem Relation Age of Onset  . Leukemia Brother   . Heart attack Brother   . Lung cancer Brother   . Colon cancer Brother   . Leukemia Sister     Social History Social History  Substance Use Topics  . Smoking status: Former Smoker    Packs/day: 2.50    Years:  35.00    Types: Cigarettes    Start date: 11/22/1958    Quit date: 11/21/1993  . Smokeless tobacco: Never Used  . Alcohol use 0.0 oz/week     Comment: occasional beer     Allergies   Codeine   Review of Systems Review of Systems  Constitutional: Negative for appetite change and fatigue.  HENT: Negative for congestion, ear discharge and sinus pressure.   Eyes: Negative for discharge.  Respiratory: Negative for cough.   Cardiovascular: Negative for chest pain.  Gastrointestinal: Positive for abdominal pain and hematochezia. Negative for diarrhea.       Rectal bleeding  Genitourinary: Negative for frequency and hematuria.  Musculoskeletal:  Negative for back pain.  Skin: Negative for rash.  Neurological: Negative for seizures and headaches.  Psychiatric/Behavioral: Negative for hallucinations.     Physical Exam Updated Vital Signs BP (!) 159/93   Pulse 79   Temp 98.4 F (36.9 C) (Oral)   Resp 17   Ht 5\' 8"  (1.727 m)   Wt 204 lb (92.5 kg)   SpO2 98%   BMI 31.02 kg/m   Physical Exam  Constitutional: He is oriented to person, place, and time. He appears well-developed.  HENT:  Head: Normocephalic.  Eyes: Conjunctivae and EOM are normal. No scleral icterus.  Neck: Neck supple. No thyromegaly present.  Cardiovascular: Normal rate and regular rhythm.  Exam reveals no gallop and no friction rub.   No murmur heard. Pulmonary/Chest: No stridor. He has no wheezes. He has no rales. He exhibits no tenderness.  Abdominal: He exhibits no distension. There is no tenderness. There is no rebound.  Genitourinary:  Genitourinary Comments: No stool in vault  Musculoskeletal: Normal range of motion. He exhibits no edema.  Lymphadenopathy:    He has no cervical adenopathy.  Neurological: He is oriented to person, place, and time. He exhibits normal muscle tone. Coordination normal.  Skin: No rash noted. No erythema.  Psychiatric: He has a normal mood and affect. His behavior is normal.     ED Treatments / Results  Labs (all labs ordered are listed, but only abnormal results are displayed) Labs Reviewed  CBC WITH DIFFERENTIAL/PLATELET - Abnormal; Notable for the following:       Result Value   WBC 13.3 (*)    Neutro Abs 8.9 (*)    Monocytes Absolute 1.3 (*)    All other components within normal limits  COMPREHENSIVE METABOLIC PANEL - Abnormal; Notable for the following:    BUN 23 (*)    Creatinine, Ser 1.66 (*)    GFR calc non Af Amer 40 (*)    GFR calc Af Amer 47 (*)    All other components within normal limits  PROTIME-INR - Abnormal; Notable for the following:    Prothrombin Time 85.8 (*)    INR >10.01 (*)    All  other components within normal limits  I-STAT CHEM 8, ED - Abnormal; Notable for the following:    BUN 26 (*)    Creatinine, Ser 1.70 (*)    Calcium, Ion 1.14 (*)    All other components within normal limits    EKG  EKG Interpretation None       Radiology Ct Abdomen Pelvis W Contrast  Result Date: 05/29/2016 CLINICAL DATA:  Acute onset of pink mucus arising from the rectum, with sharp left lower quadrant abdominal pain. Initial encounter. EXAM: CT ABDOMEN AND PELVIS WITH CONTRAST TECHNIQUE: Multidetector CT imaging of the abdomen and pelvis was performed using  the standard protocol following bolus administration of intravenous contrast. CONTRAST:  11mL ISOVUE-300 IOPAMIDOL (ISOVUE-300) INJECTION 61% COMPARISON:  CT of the abdomen and pelvis performed 07/05/2013 FINDINGS: Lower chest: An aortic valve replacement is noted. Scattered blebs are noted at the left lung base. Hepatobiliary: The liver is unremarkable in appearance. The gallbladder is unremarkable in appearance. The common bile duct remains normal in caliber. Pancreas: The pancreas is within normal limits. Spleen: A tiny calcified granuloma is noted within the spleen. The spleen is otherwise unremarkable. Adrenals/Urinary Tract: The adrenal glands are unremarkable in appearance. Nonspecific perinephric stranding is noted bilaterally. There is no evidence of hydronephrosis. No renal or ureteral stones are identified. Stomach/Bowel: The stomach is unremarkable in appearance. The small bowel is within normal limits. The appendix is normal in caliber, without evidence of appendicitis. Marked wall thickening is noted at the proximal sigmoid colon, with inflamed diverticula and surrounding soft inflammation. There is no definite evidence of perforation or abscess formation. This is concerning for acute diverticulitis, though focal colitis might have a similar appearance. There also appears to be an enhancing vessel tracking into the rectum, with  suspicion of internal hemorrhoids. Vascular/Lymphatic: Diffuse calcification is seen along the abdominal aorta and its branches. The abdominal aorta is otherwise grossly unremarkable. The inferior vena cava is grossly unremarkable. No retroperitoneal lymphadenopathy is seen. No pelvic sidewall lymphadenopathy is identified. Reproductive: The prostate is enlarged, measuring 5.1 cm in transverse dimension, with scattered calcification. There is impression on the base of the bladder from the prostate. The bladder is mildly distended and grossly unremarkable. Other: A moderate left inguinal hernia is seen, containing only fat. Musculoskeletal: No acute osseous abnormalities are identified. The visualized musculature is unremarkable in appearance. IMPRESSION: 1. Marked wall thickening at the proximal sigmoid colon, with inflamed diverticula and surrounding soft tissue inflammation, concerning for acute diverticulitis, though focal colitis might have a similar appearance. No definite evidence of perforation or abscess formation at this time. 2. Apparent enhancing vessel noted tracking into the rectum, with suspicion of internal hemorrhoids. 3. Diffuse aortic atherosclerosis. 4. Enlarged prostate. 5. Moderate left inguinal hernia, containing only fat. 6. Scattered blebs at the left lung base. Electronically Signed   By: Garald Balding M.D.   On: 05/29/2016 22:14    Procedures Procedures (including critical care time)  Medications Ordered in ED Medications  ciprofloxacin (CIPRO) IVPB 400 mg (not administered)  metroNIDAZOLE (FLAGYL) IVPB 500 mg (not administered)  sodium chloride 0.9 % bolus 500 mL (0 mLs Intravenous Stopped 05/29/16 2139)  iopamidol (ISOVUE-300) 61 % injection 100 mL (100 mLs Intravenous Contrast Given 05/29/16 2131)  phytonadione (VITAMIN K) SQ injection 10 mg (10 mg Subcutaneous Given 05/29/16 2204)     Initial Impression / Assessment and Plan / ED Course  I have reviewed the triage vital  signs and the nursing notes.  Pertinent labs & imaging results that were available during my care of the patient were reviewed by me and considered in my medical decision making (see chart for details).     CRITICAL CARE Performed by: Veronnica Hennings L Total critical care time: 40 minutes Critical care time was exclusive of separately billable procedures and treating other patients. Critical care was necessary to treat or prevent imminent or life-threatening deterioration. Critical care was time spent personally by me on the following activities: development of treatment plan with patient and/or surrogate as well as nursing, discussions with consultants, evaluation of patient's response to treatment, examination of patient, obtaining history from patient or surrogate,  ordering and performing treatments and interventions, ordering and review of laboratory studies, ordering and review of radiographic studies, pulse oximetry and re-evaluation of patient's condition. Patient with rectal bleeding and possible diverticulitis also patient has INR greater than 10. Patient has been given vitamin K and the hospitalist deciding whether to give FFP. Ration is hemodynamically stable with a normal hemoglobin right now  Final Clinical Impressions(s) / ED Diagnoses   Final diagnoses:  Rectal bleeding    New Prescriptions New Prescriptions   No medications on file     Milton Ferguson, MD 05/29/16 2246

## 2016-05-29 NOTE — ED Notes (Signed)
CRITICAL VALUE ALERT  Critical value received: INR >10.01 Date of notification: 05/29/16 Time of notification:  2147 Critical value read back:Yes.   Nurse who received alert:  GMP MD notified (1st page):  [place] Time of first page: 2147 Responding MD:  Zammitt Time MD responded:  2147

## 2016-05-29 NOTE — ED Notes (Signed)
Patient transported to CT 

## 2016-05-29 NOTE — H&P (Addendum)
[facility] H&P    Patient Demographics:    Stephen Moses, is a 71 y.o. male  MRN: [id number]  DOB - 21-Jun-1945  Admit Date - 05/29/2016  Referring MD/NP/PA: Dr. Roderic Palau  Outpatient Primary MD for the patient is [name], MD  Patient coming from: Home  Chief Complaint  Patient presents with  . Rectal Bleeding      HPI:    Stephen Moses  is a 71 y.o. male, With history of aortic valve replacement on chronic anticoagulants with Coumadin, hypertension  ascending aortic aneurysm, who came to hospital with 12 bouts of frothy pinkish bowel movements since this morning. He denies nausea vomiting. Denies fever or chills. No chest pain or shortness of breath.  In the ED hemoglobin is stable at 14.6. INR was found to be significantly elevated greater than 10.0. Previous INR on 05/18/2016 was 2.8. Patient did not take any antibiotics or change dose of Coumadin. CT scan of the abdomen showed mild wall thickening at the proximal sigmoid colon, with inflamed diverticula and surrounding soft tissue inflammation concerning for acute diverticulitis..  Patient had colonoscopy in 2016 which showed scattered diverticula at sigmoid colon.     Review of systems:    In addition to the HPI above,  No Fever-chills, No Headache, No changes with Vision or hearing, No problems swallowing food or Liquids, + Blood in stool  No dysuria, No new skin rashes or bruises, No new joints pains-aches,  No new weakness, tingling, numbness in any extremity, No recent weight gain or loss, No polyuria, polydypsia or polyphagia, No significant Mental Stressors.  A full 10 point Review of Systems was done, except as stated above, all other Review of Systems were negative.   With Past History of the following :    Past Medical History:  Diagnosis Date  . AORTIC VALVE REPLACEMENT, HX OF 07/06/2009   Qualifier: Diagnosis of  By: Purcell Nails, NP,  Curt Bears    . Ascending aortic aneurysm ([place]) 09/16/2011  . Calculus of kidney   . Dizziness   . Leukocytosis 05/17/2015  . Long term current use of anticoagulant 05/30/2010  . Normocytic anemia 05/18/2015  . Pancytopenia ([place]) 05/18/2015  . Paraspinal muscle spasm   . Thrombocytopenia ([place]) 05/18/2015  . Unspecified essential hypertension       Past Surgical History:  Procedure Laterality Date  . back surgery  2009  . CARDIAC VALVE REPLACEMENT    . COLONOSCOPY    . COLONOSCOPY N/A 10/16/2014   Procedure: COLONOSCOPY;  Surgeon: Rogene Houston, MD;  Location: [facility];  Service: Endoscopy;  Laterality: N/A;  930  . EYE SURGERY    . Left rotator cuff repair    . right shoulder surgery        Social History:      Social History  Substance Use Topics  . Smoking status: Former Smoker    Packs/day: 2.50    Years: 35.00    Types: Cigarettes    Start date: 11/22/1958    Quit date: 11/21/1993  . Smokeless tobacco: Never  Used  . Alcohol use 0.0 oz/week     Comment: occasional beer       Family History :     Family History  Problem Relation Age of Onset  . Leukemia Brother   . Heart attack Brother   . Lung cancer Brother   . Colon cancer Brother   . Leukemia Sister       Home Medications:   Prior to Admission medications   Medication Sig Start Date End Date Taking? Authorizing Provider  acetaminophen (TYLENOL) 500 MG tablet Take 500 mg by mouth every 6 (six) hours as needed for mild pain or moderate pain.    Historical Provider, MD  amLODipine (NORVASC) 5 MG tablet Take 5 mg by mouth at bedtime.     Historical Provider, MD  metoprolol succinate (TOPROL-XL) 25 MG 24 hr tablet Take 25 mg by mouth at bedtime.  06/06/12   Fay Records, MD  warfarin (COUMADIN) 10 MG tablet Take 1/2 tablet daily or as directed Patient taking differently: Take 5 mg by mouth daily. Take 1/2 tablet daily or as directed 03/19/14   Herminio Commons, MD     Allergies:     Allergies    Allergen Reactions  . Codeine Nausea And Vomiting and Palpitations     Physical Exam:   Vitals  Blood pressure (!) 150/79, pulse 66, temperature 98.4 F (36.9 C), temperature source Oral, resp. rate 17, height 5\' 8"  (1.727 m), weight 92.5 kg (204 lb), SpO2 100 %.  1.  General: Appears in no acute distress  2. Psychiatric:  Intact judgement and  insight, awake alert, oriented x 3.  3. Neurologic: No focal neurological deficits, all cranial nerves intact.Strength 5/5 all 4 extremities, sensation intact all 4 extremities, plantars down going.  4. Eyes :  anicteric sclerae, moist conjunctivae with no lid lag. PERRLA.  5. ENMT:  Oropharynx clear with moist mucous membranes and good dentition  6. Neck:  supple, no cervical lymphadenopathy appriciated, No thyromegaly  7. Respiratory : Normal respiratory effort, good air movement bilaterally,clear to  auscultation bilaterally  8. Cardiovascular : RRR, no gallops, rubs or murmurs, no leg edema  9. Gastrointestinal:  Positive bowel sounds, abdomen soft, non-tender to palpation,no hepatosplenomegaly, no rigidity or guarding       10. Skin:  No cyanosis, normal texture and turgor, no rash, lesions or ulcers  11.Musculoskeletal:  Good muscle tone,  joints appear normal , no effusions,  normal range of motion    Data Review:    CBC  Recent Labs Lab 05/29/16 2031 05/29/16 2106  WBC 13.3*  --   HGB 14.4 14.6  HCT 42.3 43.0  PLT 259  --   MCV 84.9  --   MCH 28.9  --   MCHC 34.0  --   RDW 14.7  --   LYMPHSABS 2.9  --   MONOABS 1.3*  --   EOSABS 0.1  --   BASOSABS 0.1  --    ------------------------------------------------------------------------------------------------------------------  Chemistries   Recent Labs Lab 05/29/16 2031 05/29/16 2106  NA 139 141  K 3.6 3.8  CL 107 108  CO2 24  --   GLUCOSE 99 98  BUN 23* 26*  CREATININE 1.66* 1.70*  CALCIUM 9.4  --   AST 29  --   ALT 22  --   ALKPHOS 56   --   BILITOT 0.4  --    ------------------------------------------------------------------------------------------------------------------  ------------------------------------------------------------------------------------------------------------------ GFR: Estimated Creatinine Clearance: 45.2 mL/min (A) (by C-G  formula based on SCr of 1.7 mg/dL (H)). Liver Function Tests:  Recent Labs Lab 05/29/16 2031  AST 29  ALT 22  ALKPHOS 56  BILITOT 0.4  PROT 7.8  ALBUMIN 4.3   Coagulation Profile:  Recent Labs Lab 05/29/16 2031  INR >10.01*    --------------------------------------------------------------------------------------------------------------- Urine analysis:    Component Value Date/Time   COLORURINE YELLOW 02/19/2016 2039   APPEARANCEUR CLEAR 02/19/2016 2039   LABSPEC 1.020 02/19/2016 2039   PHURINE 5.0 02/19/2016 2039   GLUCOSEU NEGATIVE 02/19/2016 2039   HGBUR MODERATE (A) 02/19/2016 2039   BILIRUBINUR NEGATIVE 02/19/2016 2039   KETONESUR NEGATIVE 02/19/2016 2039   PROTEINUR NEGATIVE 02/19/2016 2039   UROBILINOGEN 0.2 07/18/2008 0952   NITRITE NEGATIVE 02/19/2016 2039   LEUKOCYTESUR NEGATIVE 02/19/2016 2039      Imaging Results:    Ct Abdomen Pelvis W Contrast  Result Date: 05/29/2016 CLINICAL DATA:  Acute onset of pink mucus arising from the rectum, with sharp left lower quadrant abdominal pain. Initial encounter. EXAM: CT ABDOMEN AND PELVIS WITH CONTRAST TECHNIQUE: Multidetector CT imaging of the abdomen and pelvis was performed using the standard protocol following bolus administration of intravenous contrast. CONTRAST:  182mL ISOVUE-300 IOPAMIDOL (ISOVUE-300) INJECTION 61% COMPARISON:  CT of the abdomen and pelvis performed 07/05/2013 FINDINGS: Lower chest: An aortic valve replacement is noted. Scattered blebs are noted at the left lung base. Hepatobiliary: The liver is unremarkable in appearance. The gallbladder is unremarkable in appearance. The common  bile duct remains normal in caliber. Pancreas: The pancreas is within normal limits. Spleen: A tiny calcified granuloma is noted within the spleen. The spleen is otherwise unremarkable. Adrenals/Urinary Tract: The adrenal glands are unremarkable in appearance. Nonspecific perinephric stranding is noted bilaterally. There is no evidence of hydronephrosis. No renal or ureteral stones are identified. Stomach/Bowel: The stomach is unremarkable in appearance. The small bowel is within normal limits. The appendix is normal in caliber, without evidence of appendicitis. Marked wall thickening is noted at the proximal sigmoid colon, with inflamed diverticula and surrounding soft inflammation. There is no definite evidence of perforation or abscess formation. This is concerning for acute diverticulitis, though focal colitis might have a similar appearance. There also appears to be an enhancing vessel tracking into the rectum, with suspicion of internal hemorrhoids. Vascular/Lymphatic: Diffuse calcification is seen along the abdominal aorta and its branches. The abdominal aorta is otherwise grossly unremarkable. The inferior vena cava is grossly unremarkable. No retroperitoneal lymphadenopathy is seen. No pelvic sidewall lymphadenopathy is identified. Reproductive: The prostate is enlarged, measuring 5.1 cm in transverse dimension, with scattered calcification. There is impression on the base of the bladder from the prostate. The bladder is mildly distended and grossly unremarkable. Other: A moderate left inguinal hernia is seen, containing only fat. Musculoskeletal: No acute osseous abnormalities are identified. The visualized musculature is unremarkable in appearance. IMPRESSION: 1. Marked wall thickening at the proximal sigmoid colon, with inflamed diverticula and surrounding soft tissue inflammation, concerning for acute diverticulitis, though focal colitis might have a similar appearance. No definite evidence of  perforation or abscess formation at this time. 2. Apparent enhancing vessel noted tracking into the rectum, with suspicion of internal hemorrhoids. 3. Diffuse aortic atherosclerosis. 4. Enlarged prostate. 5. Moderate left inguinal hernia, containing only fat. 6. Scattered blebs at the left lung base. Electronically Signed   By: Garald Balding M.D.   On: 05/29/2016 22:14      Assessment & Plan:    Active Problems:   Aortic valve disorder  Long term current use of anticoagulant   Ascending aortic aneurysm ([facility])   Rectal bleeding   Diverticulitis   1. Sigmoid diverticulitis- CT scan shows sigmoid diverticulitis, wall thickening of the proximal sigmoid colon. No definite perforation or abscess seen at this time. Will continue with antibiotics ciprofloxacin and Flagyl , which was started in the ED. We'll start on clear liquid diet.  2. Rectal bleeding- patient did not have frank blood in stool, he only complains of pinkish frothy stools. Hemoglobin is stable at 14.6. Bleeding likely from diverticulitis. Will monitor CBC in a.m. 3. Supratherapeutic INR- patient's INR significantly elevated greater than 10.0, he was given vitamin K 10 mg IM in ED. Will repeat PT/INR in a.m. 4. Bicuspid aortic valve and ascending aortic aneurysm status post clinical aVR and aneurysm repair- INR is supratherapeutic, Coumadin is currently on hold. 5. Hypertension- continue amlodipine 5 mg daily, Toprol-XL 25 mg by mouth daily. Start hydralazine 25 mg by mouth every 6 hours when necessary for BP greater than 160/100.   DVT Prophylaxis-    SCDs   AM Labs Ordered, also please review Full Orders  Family Communication: Admission, patients condition and plan of care including tests being ordered have been discussed with the patient and his wife at bedside who indicate understanding and agree with the plan and Code Status.  Code Status:  Full code  Admission status: Observation    Time spent in minutes : 60  minutes   Raeana Blinn S M.D on 05/29/2016 at 11:33 PM  Between 7am to 7pm - Pager - [phone]. After 7pm go to [email or web address] - password [facility]  [facility] - Office  [phone]

## 2016-05-30 ENCOUNTER — Encounter (HOSPITAL_COMMUNITY): Payer: Self-pay | Admitting: *Deleted

## 2016-05-30 ENCOUNTER — Observation Stay (HOSPITAL_COMMUNITY): Payer: Medicare Other

## 2016-05-30 DIAGNOSIS — K625 Hemorrhage of anus and rectum: Secondary | ICD-10-CM

## 2016-05-30 DIAGNOSIS — R791 Abnormal coagulation profile: Secondary | ICD-10-CM | POA: Diagnosis not present

## 2016-05-30 DIAGNOSIS — K5733 Diverticulitis of large intestine without perforation or abscess with bleeding: Secondary | ICD-10-CM | POA: Diagnosis not present

## 2016-05-30 DIAGNOSIS — Z952 Presence of prosthetic heart valve: Secondary | ICD-10-CM

## 2016-05-30 DIAGNOSIS — Z7901 Long term (current) use of anticoagulants: Secondary | ICD-10-CM | POA: Diagnosis not present

## 2016-05-30 DIAGNOSIS — K5790 Diverticulosis of intestine, part unspecified, without perforation or abscess without bleeding: Secondary | ICD-10-CM | POA: Diagnosis not present

## 2016-05-30 LAB — COMPREHENSIVE METABOLIC PANEL
ALK PHOS: 49 U/L (ref 38–126)
ALT: 19 U/L (ref 17–63)
ANION GAP: 9 (ref 5–15)
AST: 25 U/L (ref 15–41)
Albumin: 3.7 g/dL (ref 3.5–5.0)
BUN: 19 mg/dL (ref 6–20)
CALCIUM: 8.9 mg/dL (ref 8.9–10.3)
CO2: 22 mmol/L (ref 22–32)
Chloride: 107 mmol/L (ref 101–111)
Creatinine, Ser: 1.53 mg/dL — ABNORMAL HIGH (ref 0.61–1.24)
GFR calc non Af Amer: 45 mL/min — ABNORMAL LOW (ref >60)
GFR, EST AFRICAN AMERICAN: 52 mL/min — AB (ref >60)
Glucose, Bld: 123 mg/dL — ABNORMAL HIGH (ref 65–99)
Potassium: 3.7 mmol/L (ref 3.5–5.1)
SODIUM: 138 mmol/L (ref 135–145)
Total Bilirubin: 0.4 mg/dL (ref 0.3–1.2)
Total Protein: 7 g/dL (ref 6.5–8.1)

## 2016-05-30 LAB — CBC
HCT: 40.1 % (ref 39.0–52.0)
HEMOGLOBIN: 13.4 g/dL (ref 13.0–17.0)
MCH: 28.5 pg (ref 26.0–34.0)
MCHC: 33.4 g/dL (ref 30.0–36.0)
MCV: 85.1 fL (ref 78.0–100.0)
PLATELETS: 241 10*3/uL (ref 150–400)
RBC: 4.71 MIL/uL (ref 4.22–5.81)
RDW: 14.7 % (ref 11.5–15.5)
WBC: 13.1 10*3/uL — ABNORMAL HIGH (ref 4.0–10.5)

## 2016-05-30 LAB — PROTIME-INR
INR: 10.5
INR: 2.12
PROTHROMBIN TIME: 24.1 s — AB (ref 11.4–15.2)
Prothrombin Time: 86.6 seconds — ABNORMAL HIGH (ref 11.4–15.2)

## 2016-05-30 MED ORDER — METRONIDAZOLE IN NACL 5-0.79 MG/ML-% IV SOLN
500.0000 mg | Freq: Three times a day (TID) | INTRAVENOUS | Status: DC
  Administered 2016-05-30 – 2016-06-01 (×7): 500 mg via INTRAVENOUS
  Filled 2016-05-30 (×7): qty 100

## 2016-05-30 MED ORDER — SODIUM CHLORIDE 0.9 % IV SOLN
INTRAVENOUS | Status: DC
  Administered 2016-05-30 (×2): via INTRAVENOUS

## 2016-05-30 MED ORDER — ACETAMINOPHEN 650 MG RE SUPP: 650.0000 mg | Freq: Four times a day (QID) | RECTAL | Status: DC | PRN

## 2016-05-30 MED ORDER — CIPROFLOXACIN IN D5W 400 MG/200ML IV SOLN
400.0000 mg | Freq: Two times a day (BID) | INTRAVENOUS | Status: DC
  Administered 2016-05-30 – 2016-05-31 (×4): 400 mg via INTRAVENOUS
  Filled 2016-05-30 (×4): qty 200

## 2016-05-30 MED ORDER — HYDRALAZINE HCL 25 MG PO TABS: 25.0000 mg | ORAL_TABLET | Freq: Four times a day (QID) | ORAL | Status: DC | PRN

## 2016-05-30 MED ORDER — METOPROLOL SUCCINATE ER 25 MG PO TB24
25.0000 mg | ORAL_TABLET | Freq: Every day | ORAL | Status: DC
  Administered 2016-05-30 – 2016-05-31 (×2): 25 mg via ORAL
  Filled 2016-05-30 (×2): qty 1

## 2016-05-30 MED ORDER — VITAMIN K1 10 MG/ML IJ SOLN
5.0000 mg | Freq: Once | INTRAVENOUS | Status: AC
  Administered 2016-05-30: 5 mg via INTRAVENOUS
  Filled 2016-05-30: qty 0.5

## 2016-05-30 MED ORDER — ONDANSETRON HCL 4 MG PO TABS: 4.0000 mg | ORAL_TABLET | Freq: Four times a day (QID) | ORAL | Status: DC | PRN

## 2016-05-30 MED ORDER — ACETAMINOPHEN 500 MG PO TABS
500.0000 mg | ORAL_TABLET | Freq: Four times a day (QID) | ORAL | Status: DC | PRN
  Administered 2016-05-30: 500 mg via ORAL
  Filled 2016-05-30: qty 1

## 2016-05-30 MED ORDER — ONDANSETRON HCL 4 MG/2ML IJ SOLN: 4.0000 mg | Freq: Four times a day (QID) | INTRAMUSCULAR | Status: DC | PRN

## 2016-05-30 MED ORDER — AMLODIPINE BESYLATE 5 MG PO TABS
5.0000 mg | ORAL_TABLET | Freq: Every day | ORAL | Status: DC
  Administered 2016-05-30 – 2016-05-31 (×2): 5 mg via ORAL
  Filled 2016-05-30 (×2): qty 1

## 2016-05-30 MED ORDER — ACETAMINOPHEN 325 MG PO TABS: 650.0000 mg | ORAL_TABLET | Freq: Four times a day (QID) | ORAL | Status: DC | PRN

## 2016-05-30 NOTE — Progress Notes (Signed)
ANTICOAGULATION CONSULT NOTE - Initial Consult  Pharmacy Consult for Coumadin Indication: AVR  Allergies  Allergen Reactions  . Codeine Nausea And Vomiting and Palpitations    Patient Measurements: Height: 5\' 8"  (172.7 cm) Weight: 202 lb 9.6 oz (91.9 kg) IBW/kg (Calculated) : 68.4  Vital Signs: Temp: 98.3 F (36.8 C) (04/09 0300) Temp Source: Oral (04/09 0300) BP: 160/85 (04/09 0300) Pulse Rate: 67 (04/09 0300)  Labs:  Recent Labs  05/29/16 2031 05/29/16 2106 05/30/16 0533  HGB 14.4 14.6 13.4  HCT 42.3 43.0 40.1  PLT 259  --  241  LABPROT 85.8*  --  86.6*  INR >10.01*  --  10.50*  CREATININE 1.66* 1.70* 1.53*    Estimated Creatinine Clearance: 50.1 mL/min (A) (by C-G formula based on SCr of 1.53 mg/dL (H)).   Medical History: Past Medical History:  Diagnosis Date  . AORTIC VALVE REPLACEMENT, HX OF 07/06/2009   Qualifier: Diagnosis of  By: Purcell Nails, NP, Curt Bears    . Ascending aortic aneurysm ([place]) 09/16/2011  . Calculus of kidney   . Dizziness   . Leukocytosis 05/17/2015  . Long term current use of anticoagulant 05/30/2010  . Normocytic anemia 05/18/2015  . Pancytopenia ([place]) 05/18/2015  . Paraspinal muscle spasm   . Thrombocytopenia ([place]) 05/18/2015  . Unspecified essential hypertension     Medications:  Prescriptions Prior to Admission  Medication Sig Dispense Refill Last Dose  . acetaminophen (TYLENOL) 500 MG tablet Take 500 mg by mouth every 6 (six) hours as needed for mild pain or moderate pain.   Taking  . amLODipine (NORVASC) 5 MG tablet Take 5 mg by mouth at bedtime.    Taking  . metoprolol succinate (TOPROL-XL) 25 MG 24 hr tablet Take 25 mg by mouth at bedtime.    Taking  . warfarin (COUMADIN) 10 MG tablet Take 1/2 tablet daily or as directed (Patient taking differently: Take 5 mg by mouth daily. Take 1/2 tablet daily or as directed) 90 tablet 3 Taking    Assessment: 71 yo male admitted with rectal bleeding. He is on chronic coumadin tx for aortic  valve replacement. Also, has h/o HTN and ascending aortic aneurysm. Patient presents to hospital with 12 bouts of frothy pinkish BM. CT consistent with diverticulitis. INR was significantly elevated >10. Vitamin K 10mg  SQ  was given at 2200 4/8. INR this AM still elevated at 10.5. An additional Vitamin K 5mg  IV ordered this AM.    Goal of Therapy:  INR 2.5-3.5 Monitor platelets by anticoagulation protocol: Yes   Plan:  Hold Coumadin Restart Coumadin when INR <3.5 PT-INR daily Monitor for bleeding  Isac Sarna, BS Vena [place], BCPS Clinical Pharmacist Pager [phone]  05/30/2016,8:25 AM

## 2016-05-30 NOTE — Care Management Obs Status (Signed)
[facility] NOTIFICATION   Patient Details  Name: HOYT LEANOS MRN: [id number] Date of Birth: December 03, 1945   Medicare Observation Status Notification Given:  Yes    Kindred Heying, Chauncey Reading, RN 05/30/2016, 2:11 PM

## 2016-05-30 NOTE — Progress Notes (Signed)
Subjective: He was admitted with left lower quadrant pain and pink mucous-like stools. CT was read as consistent with diverticulitis. He has been treated with Cipro and Flagyl. He continues to have left lower quadrant pain and recurrent similar stools. INR was elevated above 10.  Objective: Vital signs in last 24 hours: Vitals:   05/30/16 0000 05/30/16 0030 05/30/16 0105 05/30/16 0300  BP: (!) 155/84 138/81 (!) 166/79 (!) 160/85  Pulse: 66 65 79 67  Resp: 17 17 16 17   Temp:   98.4 F (36.9 C) 98.3 F (36.8 C)  TempSrc:   Oral Oral  SpO2: 98% 96% 98% 98%  Weight:   202 lb 9.6 oz (91.9 kg)   Height:   5\' 8"  (1.727 m)    Weight change:   Intake/Output Summary (Last 24 hours) at 05/30/16 0741 Last data filed at 05/30/16 0303  Gross per 24 hour  Intake             1015 ml  Output              300 ml  Net              715 ml    Physical Exam: Alert. No distress. Lungs clear. Heart regular with a mechanical S2. Abdomen soft and nondistended with tenderness in the left lower quadrant. Extremities reveal no edema.  Lab Results:    Results for orders placed or performed during the hospital encounter of 05/29/16 (from the past 24 hour(s))  CBC with Differential/Platelet     Status: Abnormal   Collection Time: 05/29/16  8:31 PM  Result Value Ref Range   WBC 13.3 (H) 4.0 - 10.5 K/uL   RBC 4.98 4.22 - 5.81 MIL/uL   Hemoglobin 14.4 13.0 - 17.0 g/dL   HCT 42.3 39.0 - 52.0 %   MCV 84.9 78.0 - 100.0 fL   MCH 28.9 26.0 - 34.0 pg   MCHC 34.0 30.0 - 36.0 g/dL   RDW 14.7 11.5 - 15.5 %   Platelets 259 150 - 400 K/uL   Neutrophils Relative % 67 %   Neutro Abs 8.9 (H) 1.7 - 7.7 K/uL   Lymphocytes Relative 22 %   Lymphs Abs 2.9 0.7 - 4.0 K/uL   Monocytes Relative 10 %   Monocytes Absolute 1.3 (H) 0.1 - 1.0 K/uL   Eosinophils Relative 1 %   Eosinophils Absolute 0.1 0.0 - 0.7 K/uL   Basophils Relative 0 %   Basophils Absolute 0.1 0.0 - 0.1 K/uL  Comprehensive metabolic panel     Status:  Abnormal   Collection Time: 05/29/16  8:31 PM  Result Value Ref Range   Sodium 139 135 - 145 mmol/L   Potassium 3.6 3.5 - 5.1 mmol/L   Chloride 107 101 - 111 mmol/L   CO2 24 22 - 32 mmol/L   Glucose, Bld 99 65 - 99 mg/dL   BUN 23 (H) 6 - 20 mg/dL   Creatinine, Ser 1.66 (H) 0.61 - 1.24 mg/dL   Calcium 9.4 8.9 - 10.3 mg/dL   Total Protein 7.8 6.5 - 8.1 g/dL   Albumin 4.3 3.5 - 5.0 g/dL   AST 29 15 - 41 U/L   ALT 22 17 - 63 U/L   Alkaline Phosphatase 56 38 - 126 U/L   Total Bilirubin 0.4 0.3 - 1.2 mg/dL   GFR calc non Af Amer 40 (L) >60 mL/min   GFR calc Af Amer 47 (L) >60 mL/min   Anion gap  8 5 - 15  Protime-INR     Status: Abnormal   Collection Time: 05/29/16  8:31 PM  Result Value Ref Range   Prothrombin Time 85.8 (H) 11.4 - 15.2 seconds   INR >10.01 (HH)   I-stat chem 8, ed     Status: Abnormal   Collection Time: 05/29/16  9:06 PM  Result Value Ref Range   Sodium 141 135 - 145 mmol/L   Potassium 3.8 3.5 - 5.1 mmol/L   Chloride 108 101 - 111 mmol/L   BUN 26 (H) 6 - 20 mg/dL   Creatinine, Ser 1.70 (H) 0.61 - 1.24 mg/dL   Glucose, Bld 98 65 - 99 mg/dL   Calcium, Ion 1.14 (L) 1.15 - 1.40 mmol/L   TCO2 26 0 - 100 mmol/L   Hemoglobin 14.6 13.0 - 17.0 g/dL   HCT 43.0 39.0 - 52.0 %  CBC     Status: Abnormal   Collection Time: 05/30/16  5:33 AM  Result Value Ref Range   WBC 13.1 (H) 4.0 - 10.5 K/uL   RBC 4.71 4.22 - 5.81 MIL/uL   Hemoglobin 13.4 13.0 - 17.0 g/dL   HCT 40.1 39.0 - 52.0 %   MCV 85.1 78.0 - 100.0 fL   MCH 28.5 26.0 - 34.0 pg   MCHC 33.4 30.0 - 36.0 g/dL   RDW 14.7 11.5 - 15.5 %   Platelets 241 150 - 400 K/uL  Comprehensive metabolic panel     Status: Abnormal   Collection Time: 05/30/16  5:33 AM  Result Value Ref Range   Sodium 138 135 - 145 mmol/L   Potassium 3.7 3.5 - 5.1 mmol/L   Chloride 107 101 - 111 mmol/L   CO2 22 22 - 32 mmol/L   Glucose, Bld 123 (H) 65 - 99 mg/dL   BUN 19 6 - 20 mg/dL   Creatinine, Ser 1.53 (H) 0.61 - 1.24 mg/dL   Calcium 8.9  8.9 - 10.3 mg/dL   Total Protein 7.0 6.5 - 8.1 g/dL   Albumin 3.7 3.5 - 5.0 g/dL   AST 25 15 - 41 U/L   ALT 19 17 - 63 U/L   Alkaline Phosphatase 49 38 - 126 U/L   Total Bilirubin 0.4 0.3 - 1.2 mg/dL   GFR calc non Af Amer 45 (L) >60 mL/min   GFR calc Af Amer 52 (L) >60 mL/min   Anion gap 9 5 - 15  Protime-INR     Status: Abnormal   Collection Time: 05/30/16  5:33 AM  Result Value Ref Range   Prothrombin Time 86.6 (H) 11.4 - 15.2 seconds   INR 10.50 (HH)      ABGS  Recent Labs  05/29/16 2106  TCO2 26   CULTURES No results found for this or any previous visit (from the past 240 hour(s)). Studies/Results: Ct Abdomen Pelvis W Contrast  Result Date: 05/29/2016 CLINICAL DATA:  Acute onset of pink mucus arising from the rectum, with sharp left lower quadrant abdominal pain. Initial encounter. EXAM: CT ABDOMEN AND PELVIS WITH CONTRAST TECHNIQUE: Multidetector CT imaging of the abdomen and pelvis was performed using the standard protocol following bolus administration of intravenous contrast. CONTRAST:  117mL ISOVUE-300 IOPAMIDOL (ISOVUE-300) INJECTION 61% COMPARISON:  CT of the abdomen and pelvis performed 07/05/2013 FINDINGS: Lower chest: An aortic valve replacement is noted. Scattered blebs are noted at the left lung base. Hepatobiliary: The liver is unremarkable in appearance. The gallbladder is unremarkable in appearance. The common bile duct remains normal in  caliber. Pancreas: The pancreas is within normal limits. Spleen: A tiny calcified granuloma is noted within the spleen. The spleen is otherwise unremarkable. Adrenals/Urinary Tract: The adrenal glands are unremarkable in appearance. Nonspecific perinephric stranding is noted bilaterally. There is no evidence of hydronephrosis. No renal or ureteral stones are identified. Stomach/Bowel: The stomach is unremarkable in appearance. The small bowel is within normal limits. The appendix is normal in caliber, without evidence of appendicitis.  Marked wall thickening is noted at the proximal sigmoid colon, with inflamed diverticula and surrounding soft inflammation. There is no definite evidence of perforation or abscess formation. This is concerning for acute diverticulitis, though focal colitis might have a similar appearance. There also appears to be an enhancing vessel tracking into the rectum, with suspicion of internal hemorrhoids. Vascular/Lymphatic: Diffuse calcification is seen along the abdominal aorta and its branches. The abdominal aorta is otherwise grossly unremarkable. The inferior vena cava is grossly unremarkable. No retroperitoneal lymphadenopathy is seen. No pelvic sidewall lymphadenopathy is identified. Reproductive: The prostate is enlarged, measuring 5.1 cm in transverse dimension, with scattered calcification. There is impression on the base of the bladder from the prostate. The bladder is mildly distended and grossly unremarkable. Other: A moderate left inguinal hernia is seen, containing only fat. Musculoskeletal: No acute osseous abnormalities are identified. The visualized musculature is unremarkable in appearance. IMPRESSION: 1. Marked wall thickening at the proximal sigmoid colon, with inflamed diverticula and surrounding soft tissue inflammation, concerning for acute diverticulitis, though focal colitis might have a similar appearance. No definite evidence of perforation or abscess formation at this time. 2. Apparent enhancing vessel noted tracking into the rectum, with suspicion of internal hemorrhoids. 3. Diffuse aortic atherosclerosis. 4. Enlarged prostate. 5. Moderate left inguinal hernia, containing only fat. 6. Scattered blebs at the left lung base. Electronically Signed   By: Garald Balding M.D.   On: 05/29/2016 22:14   Micro Results: No results found for this or any previous visit (from the past 240 hour(s)). Studies/Results: Ct Abdomen Pelvis W Contrast  Result Date: 05/29/2016 CLINICAL DATA:  Acute onset of  pink mucus arising from the rectum, with sharp left lower quadrant abdominal pain. Initial encounter. EXAM: CT ABDOMEN AND PELVIS WITH CONTRAST TECHNIQUE: Multidetector CT imaging of the abdomen and pelvis was performed using the standard protocol following bolus administration of intravenous contrast. CONTRAST:  138mL ISOVUE-300 IOPAMIDOL (ISOVUE-300) INJECTION 61% COMPARISON:  CT of the abdomen and pelvis performed 07/05/2013 FINDINGS: Lower chest: An aortic valve replacement is noted. Scattered blebs are noted at the left lung base. Hepatobiliary: The liver is unremarkable in appearance. The gallbladder is unremarkable in appearance. The common bile duct remains normal in caliber. Pancreas: The pancreas is within normal limits. Spleen: A tiny calcified granuloma is noted within the spleen. The spleen is otherwise unremarkable. Adrenals/Urinary Tract: The adrenal glands are unremarkable in appearance. Nonspecific perinephric stranding is noted bilaterally. There is no evidence of hydronephrosis. No renal or ureteral stones are identified. Stomach/Bowel: The stomach is unremarkable in appearance. The small bowel is within normal limits. The appendix is normal in caliber, without evidence of appendicitis. Marked wall thickening is noted at the proximal sigmoid colon, with inflamed diverticula and surrounding soft inflammation. There is no definite evidence of perforation or abscess formation. This is concerning for acute diverticulitis, though focal colitis might have a similar appearance. There also appears to be an enhancing vessel tracking into the rectum, with suspicion of internal hemorrhoids. Vascular/Lymphatic: Diffuse calcification is seen along the abdominal aorta and its  branches. The abdominal aorta is otherwise grossly unremarkable. The inferior vena cava is grossly unremarkable. No retroperitoneal lymphadenopathy is seen. No pelvic sidewall lymphadenopathy is identified. Reproductive: The prostate is  enlarged, measuring 5.1 cm in transverse dimension, with scattered calcification. There is impression on the base of the bladder from the prostate. The bladder is mildly distended and grossly unremarkable. Other: A moderate left inguinal hernia is seen, containing only fat. Musculoskeletal: No acute osseous abnormalities are identified. The visualized musculature is unremarkable in appearance. IMPRESSION: 1. Marked wall thickening at the proximal sigmoid colon, with inflamed diverticula and surrounding soft tissue inflammation, concerning for acute diverticulitis, though focal colitis might have a similar appearance. No definite evidence of perforation or abscess formation at this time. 2. Apparent enhancing vessel noted tracking into the rectum, with suspicion of internal hemorrhoids. 3. Diffuse aortic atherosclerosis. 4. Enlarged prostate. 5. Moderate left inguinal hernia, containing only fat. 6. Scattered blebs at the left lung base. Electronically Signed   By: Garald Balding M.D.   On: 05/29/2016 22:14   Medications:  I have reviewed the patient's current medications Scheduled Meds: . amLODipine  5 mg Oral QHS  . ciprofloxacin  400 mg Intravenous Q12H  . metoprolol succinate  25 mg Oral QHS  . metronidazole  500 mg Intravenous Q8H   Continuous Infusions: . sodium chloride 75 mL/hr at 05/30/16 0131   PRN Meds:.acetaminophen **OR** acetaminophen, acetaminophen, hydrALAZINE, ondansetron **OR** ondansetron (ZOFRAN) IV   Assessment/Plan: #1. Left lower quadrant pain and pink mucus-like stools. Diverticulitis by CT. Question possibility of ischemic colitis. Consult gastroenterology. Hemoglobin is 13.4. INR is 10.5. He received vitamin K. Coumadin is being held.  Continue Cipro and Flagyl. #2. Aortic valve replacement. We will consult cardiology. #3. Chronic kidney disease stage III. INR 1.5. Stable. He does not use NSAIDs. Active Problems:   Aortic valve disorder   Long term current use of  anticoagulant   Ascending aortic aneurysm ([facility])   Rectal bleeding   Diverticulitis     LOS: 0 days   Tess Potts 05/30/2016, 7:41 AM

## 2016-05-30 NOTE — Progress Notes (Signed)
CRITICAL VALUE ALERT  Critical value received: INR 10.5  Date of notification:  05/30/16  Time of notification:  0658  Critical value read back:Yes.    Nurse who received alert:  THandy  MD notified (1st page):  [place]  Time of first page:  0700  MD notified (2nd page):  Time of second page:  Responding MD:    Time MD responded:

## 2016-05-30 NOTE — Consult Note (Signed)
CARDIOLOGY CONSULT NOTE   Patient ID: Stephen Moses MRN: [id number] DOB/AGE: September 27, 1945 71 y.o.  Admit Date: 05/29/2016 Referring Physician: Willey Blade Primary Physician: [name], MD Consulting Cardiologist: Bronson Ing MD Primary Cardiologist: Bronson Ing, MD Reason for Consultation: Assistance with management of anticoagulation in the setting of GIB with AVR on coumadin.  Clinical Summary Stephen Moses is a 71 y.o. male who is being seen today for the evaluation of anticoagulation for at the request of Dr.Fagan for assistance with anticoagulation management. The patient has a history of a mechanical .AoV repair in the setting of Bicuspid Aortic Valve and AAA repair in 2010. He is being followed in our coumadin clinic for management of dosing. Last seen in the coumadin clinic on 05/18/2016 with INR of 2.8. Last seen for office visit 06/2015.  Other history includes hypertension, thrombocytopenia, and anemia.   Patient presented to the ER with complaints of rectal bleeding, with CT revealing diverticulitis with the question of possible ischemic colitis. On arrival, INR was 10.5. He complained of pink, mucus like stools, and left lower quadrant pain. He was found to be hypertensive with BNP 171/89, Pertinent labs revealed Creatinine of 1.70, BUN 26, WBC 13.3,. He was not found to be anemic, Hgb 14.4, Hct 42.3. He was treated with IV fluids, given 10 mg of Vitamin K SQ, and started on cipro and flagyl. INR this am 10.50. Coumadin is on hold. He was given 5 mg IV X 1 of Vitamin K this am by Dr. Willey Blade.   He states that he has been meticulous on his medications. He follows everything to the letter, and checks in the with coumadin clinic when something new occurs. He states that he has been medically compliant. Only difference in diet has been eating [place] Onion dip. He states he ate a lot of it over the weekend at a anniversary celebration. He states after eating so much dip, he began to have  gas-like feeling in abdomen and some discomfort. It was not until the next day that he noticed pink mucus stools.     Allergies  Allergen Reactions  . Codeine Nausea And Vomiting and Palpitations    Medications Scheduled Medications: . amLODipine  5 mg Oral QHS  . ciprofloxacin  400 mg Intravenous Q12H  . metoprolol succinate  25 mg Oral QHS  . metronidazole  500 mg Intravenous Q8H  . phytonadione (VITAMIN K) IV  5 mg Intravenous Once     Infusions: . sodium chloride 50 mL/hr at 05/30/16 0834     PRN Medications:  acetaminophen **OR** acetaminophen, acetaminophen, hydrALAZINE, ondansetron **OR** ondansetron (ZOFRAN) IV   Past Medical History:  Diagnosis Date  . AORTIC VALVE REPLACEMENT, HX OF 07/06/2009   Qualifier: Diagnosis of  By: Purcell Nails, NP, Curt Bears    . Ascending aortic aneurysm ([place]) 09/16/2011  . Calculus of kidney   . Dizziness   . Leukocytosis 05/17/2015  . Long term current use of anticoagulant 05/30/2010  . Normocytic anemia 05/18/2015  . Pancytopenia ([place]) 05/18/2015  . Paraspinal muscle spasm   . Thrombocytopenia ([place]) 05/18/2015  . Unspecified essential hypertension     Past Surgical History:  Procedure Laterality Date  . back surgery  2009  . CARDIAC VALVE REPLACEMENT    . COLONOSCOPY    . COLONOSCOPY N/A 10/16/2014   Procedure: COLONOSCOPY;  Surgeon: Rogene Houston, MD;  Location: [facility];  Service: Endoscopy;  Laterality: N/A;  930  . EYE SURGERY    . Left rotator cuff  repair    . right shoulder surgery      Family History  Problem Relation Age of Onset  . Leukemia Brother   . Heart attack Brother   . Lung cancer Brother   . Colon cancer Brother   . Leukemia Sister      Social History Mr. Yankowski reports that he quit smoking about 22 years ago. His smoking use included Cigarettes. He started smoking about 57 years ago. He has a 87.50 pack-year smoking history. He has never used smokeless tobacco. Mr. Davalos reports that he drinks  alcohol.  Review of Systems Complete review of systems are found to be negative unless outlined in H&P above.  Physical Examination Blood pressure (!) 160/85, pulse 67, temperature 98.3 F (36.8 C), temperature source Oral, resp. rate 17, height 5\' 8"  (1.727 m), weight 202 lb 9.6 oz (91.9 kg), SpO2 98 %.  Intake/Output Summary (Last 24 hours) at 05/30/16 0913 Last data filed at 05/30/16 0900  Gross per 24 hour  Intake          1528.75 ml  Output              300 ml  Net          1228.75 ml    Telemetry: NSR rates in the 70's.   GEN: No acute distress.  HEENT: Conjunctiva and lids normal, oropharynx clear with moist mucosa. Neck: Supple, no elevated JVP or carotid bruits, no thyromegaly. Lungs: Clear to auscultation, nonlabored breathing at rest. Cardiac: Regular rate and rhythm, crisp click is noted. No S3 or significant systolic murmur, no pericardial rub. Abdomen: Soft, mild tenderness in the left lower quadrant with hyperactive bowel sounds, no hepatomegaly, no guarding or rebound. Extremities: No pitting edema, distal pulses 2+. Skin: Warm and dry. Musculoskeletal: No kyphosis. Neuropsychiatric: Alert and oriented x3, affect grossly appropriate.  Prior Cardiac Testing/Procedures  Echocardiogram 07/08/2008 1. Left ventricle: The cavity size was normal. Wall thickness was  normal. Systolic function was normal. The estimated ejection  fraction was in the range of 55% to 60%. 2. Aortic valve: Bicuspid. Valve area: 2.02cm^2(VTI). Valve area:  2.1cm^2 (Vmax). 3. Atrial septum: No defect or patent foramen ovale was identified.  Cardiac Cath 06/21/2008 RESULTS:  Hemodynamics:  LV 131/22, AO 129/96.   Coronaries:  Left main was long and normal.  The LAD was large and  normal.  There was first diagonal, which was small and normal.  The  circumflex in the AV groove was codominant.  It was normal throughout  its course.  There was moderate size PDA, which was normal.   The right  coronary artery was moderate size codominant vessel.  It was normal  throughout its course.  The PDA of the right was moderate and normal.   Left ventriculogram:  The left ventriculogram was obtained in the RAO  projection.  The EF was 65% with normal wall motion.  An aortogram was  obtained and demonstrated diffuse aortic root, ascending aorta, and  aortic arch aneurysm.   CONCLUSION:  Normal coronary arteries.  Normal left ventricular  function.  Aortic aneurysm as described.   PLAN:  I have discussed the case with Dr. Gilford Raid.  We will obtain  a dedicated CT aortogram.  He will then be seen by Dr. Cyndia Bent in  consultation.  Lab Results  Basic Metabolic Panel:  Recent Labs Lab 05/29/16 2031 05/29/16 2106 05/30/16 0533  NA 139 141 138  K 3.6 3.8 3.7  CL 107 108  107  CO2 24  --  22  GLUCOSE 99 98 123*  BUN 23* 26* 19  CREATININE 1.66* 1.70* 1.53*  CALCIUM 9.4  --  8.9    Liver Function Tests:  Recent Labs Lab 05/29/16 2031 05/30/16 0533  AST 29 25  ALT 22 19  ALKPHOS 56 49  BILITOT 0.4 0.4  PROT 7.8 7.0  ALBUMIN 4.3 3.7    CBC:  Recent Labs Lab 05/29/16 2031 05/29/16 2106 05/30/16 0533  WBC 13.3*  --  13.1*  NEUTROABS 8.9*  --   --   HGB 14.4 14.6 13.4  HCT 42.3 43.0 40.1  MCV 84.9  --  85.1  PLT 259  --  241    Cardiac Enzymes: No results for input(s): CKTOTAL, CKMB, CKMBINDEX, TROPONINI in the last 168 hours.  BNP: Invalid input(s): POCBNP   Radiology: Ct Abdomen Pelvis W Contrast  Result Date: 05/29/2016 CLINICAL DATA:  Acute onset of pink mucus arising from the rectum, with sharp left lower quadrant abdominal pain. Initial encounter. EXAM: CT ABDOMEN AND PELVIS WITH CONTRAST TECHNIQUE: Multidetector CT imaging of the abdomen and pelvis was performed using the standard protocol following bolus administration of intravenous contrast. CONTRAST:  131mL ISOVUE-300 IOPAMIDOL (ISOVUE-300) INJECTION 61% COMPARISON:  CT of the  abdomen and pelvis performed 07/05/2013 FINDINGS: Lower chest: An aortic valve replacement is noted. Scattered blebs are noted at the left lung base. Hepatobiliary: The liver is unremarkable in appearance. The gallbladder is unremarkable in appearance. The common bile duct remains normal in caliber. Pancreas: The pancreas is within normal limits. Spleen: A tiny calcified granuloma is noted within the spleen. The spleen is otherwise unremarkable. Adrenals/Urinary Tract: The adrenal glands are unremarkable in appearance. Nonspecific perinephric stranding is noted bilaterally. There is no evidence of hydronephrosis. No renal or ureteral stones are identified. Stomach/Bowel: The stomach is unremarkable in appearance. The small bowel is within normal limits. The appendix is normal in caliber, without evidence of appendicitis. Marked wall thickening is noted at the proximal sigmoid colon, with inflamed diverticula and surrounding soft inflammation. There is no definite evidence of perforation or abscess formation. This is concerning for acute diverticulitis, though focal colitis might have a similar appearance. There also appears to be an enhancing vessel tracking into the rectum, with suspicion of internal hemorrhoids. Vascular/Lymphatic: Diffuse calcification is seen along the abdominal aorta and its branches. The abdominal aorta is otherwise grossly unremarkable. The inferior vena cava is grossly unremarkable. No retroperitoneal lymphadenopathy is seen. No pelvic sidewall lymphadenopathy is identified. Reproductive: The prostate is enlarged, measuring 5.1 cm in transverse dimension, with scattered calcification. There is impression on the base of the bladder from the prostate. The bladder is mildly distended and grossly unremarkable. Other: A moderate left inguinal hernia is seen, containing only fat. Musculoskeletal: No acute osseous abnormalities are identified. The visualized musculature is unremarkable in  appearance. IMPRESSION: 1. Marked wall thickening at the proximal sigmoid colon, with inflamed diverticula and surrounding soft tissue inflammation, concerning for acute diverticulitis, though focal colitis might have a similar appearance. No definite evidence of perforation or abscess formation at this time. 2. Apparent enhancing vessel noted tracking into the rectum, with suspicion of internal hemorrhoids. 3. Diffuse aortic atherosclerosis. 4. Enlarged prostate. 5. Moderate left inguinal hernia, containing only fat. 6. Scattered blebs at the left lung base. Electronically Signed   By: Garald Balding M.D.   On: 05/29/2016 22:14     ECG: Will order, (not completed in ER)   Impression and  Recommendations  1. Mechanical AoV: St Jude 23 mm, placed by Dr. Caffie Pinto 07/25/2008. On coumadin therapy. Has been compliant with diet and medication. INR elevated >10 . Treated with Vitamin K 10 mg SQ and one IV dose 5 mg this am. Coumadin is on hold. After speaking with pharmacist, there will be no change in INR status for approximately 12 hours. Will repeat INR at 5 pm. Will not provide any more Vitamin K until labs are reviewed. He has not had any more pink mucus stools today. Will need to go back on coumadin once stable.     2.GIB: Found to have diverticulitis and is being treated with IV abx. This will impact INR as well. GI following.   3.Thrombocytopenia: Platelets 241 this am.      Signed: Phill Myron. Lawrence NP [place]  05/30/2016, 9:13 AM Co-Sign MD  The patient was seen and examined, and I agree with the history, physical exam, assessment and plan as documented above, with modifications as noted below. I have also personally reviewed all relevant documentation, old records, labs, and both radiographic and cardiovascular studies. I have also independently interpreted old and new ECG's.  71 yr old male known to me from clinic with h/o bicuspid aortic valve and ascending aortic aneurysm with subsequent  mechanical AV prothesis and aneurysm repair in June 2010, admitted with markedly supratherapeutic INR and currently undergoing INR reversal with vitamin K.  Very perplexing to patient as he watches his diet carefully and meticulously takes warfarin as directed, with INR monitoring in our anticoagulation clinic. The only things he has eaten which are different are french onion dip and beef liver.  He is also being treated for divertculitis with IV antibiotics (Cipro and Flagyl).  Will await repeat INR this evening.   [name], MD, [facility]  05/30/2016 11:40 AM

## 2016-05-30 NOTE — Consult Note (Signed)
Referring Provider: Dr. Willey Blade  Primary Care Physician:  [name], MD Primary Gastroenterologist:  Dr. Laural Golden   Date of Admission: 05/29/16 Date of Consultation: 05/30/16   Reason for Consultation:  Rectal bleeding   HPI:  Stephen Moses is a 71 y.o. year old male presenting with acute onset of rectal bleeding in the setting of INR > 10, on Coumadin with history of valve replacement. Coumadin on hold, and he has received Vit K 10 mg SQ yesterday and another 5 mg IV this morning. Hgb 14.4 on admission and 13.4 today.   Mucus and blood since Saturday evening. Noted abdominal cramping followed by mucus/blood. Noted LLQ abdominal cramping. Acute onset Saturday night. No fever but endorsed chills. Symptoms seem to be easing off this morning and not running to the bathroom "like I was".  Medium amounts of pink blood. No vomiting. Felt nauseated earlier today but did well with broth today.   CT abd/pelvis with contrast noted marked wall thickening at proximal sigmoid colon with inflamed diverticula and surrounding soft tissue inflammation, differentials to include acute diverticulitis but unable to exclude focal colitis.INR greater than 10 on admission. Last dose of Coumadin yesterday evening. On Cipro and Flagyl IV.   Only takes acetaminophen OTC.    Colonoscopy 09/2014 by Dr. Laural Golden: tubular adenomas, scattered sigmoid diverticula, small hemorrhoids, surveillance due in 2021.    Past Medical History:  Diagnosis Date  . AORTIC VALVE REPLACEMENT, HX OF 07/06/2009   Qualifier: Diagnosis of  By: Purcell Nails, NP, Curt Bears    . Ascending aortic aneurysm ([place]) 09/16/2011  . Calculus of kidney   . Dizziness   . Leukocytosis 05/17/2015  . Long term current use of anticoagulant 05/30/2010  . Normocytic anemia 05/18/2015  . Pancytopenia ([place]) 05/18/2015  . Paraspinal muscle spasm   . Thrombocytopenia ([place]) 05/18/2015  . Unspecified essential hypertension     Past Surgical History:  Procedure Laterality  Date  . back surgery  2009  . CARDIAC VALVE REPLACEMENT    . COLONOSCOPY    . COLONOSCOPY N/A 10/16/2014   Dr. Laural Golden: two colon polyps (tubular adenomas), scattered diverticula at sigmoid, small hemorrhoids above and below dentate line, surveillance due in 2021.   [name] EYE SURGERY    . Left rotator cuff repair    . SHOULDER SURGERY Bilateral     Prior to Admission medications   Medication Sig Start Date End Date Taking? Authorizing Provider  acetaminophen (TYLENOL) 500 MG tablet Take 500 mg by mouth every 6 (six) hours as needed for mild pain or moderate pain.    Historical Provider, MD  amLODipine (NORVASC) 5 MG tablet Take 5 mg by mouth at bedtime.     Historical Provider, MD  metoprolol succinate (TOPROL-XL) 25 MG 24 hr tablet Take 25 mg by mouth at bedtime.  06/06/12   Fay Records, MD  warfarin (COUMADIN) 10 MG tablet Take 1/2 tablet daily or as directed Patient taking differently: Take 5 mg by mouth daily. Take 1/2 tablet daily or as directed 03/19/14   Herminio Commons, MD    Current Facility-Administered Medications  Medication Dose Route Frequency Provider Last Rate Last Dose  . 0.9 %  sodium chloride infusion   Intravenous Continuous [name], MD 50 mL/hr at 05/30/16 [phone]    . acetaminophen (TYLENOL) tablet 650 mg  650 mg Oral Q6H PRN Oswald Hillock, MD       Or  . acetaminophen (TYLENOL) suppository 650 mg  650 mg Rectal Q6H PRN Frederich Chick  Toney Sang, MD      . acetaminophen (TYLENOL) tablet 500 mg  500 mg Oral Q6H PRN Oswald Hillock, MD   500 mg at 05/30/16 0917  . amLODipine (NORVASC) tablet 5 mg  5 mg Oral QHS Oswald Hillock, MD      . ciprofloxacin (CIPRO) IVPB 400 mg  400 mg Intravenous Q12H Oswald Hillock, MD   400 mg at 05/30/16 1231  . hydrALAZINE (APRESOLINE) tablet 25 mg  25 mg Oral Q6H PRN Oswald Hillock, MD      . metoprolol succinate (TOPROL-XL) 24 hr tablet 25 mg  25 mg Oral QHS Oswald Hillock, MD      . metroNIDAZOLE (FLAGYL) IVPB 500 mg  500 mg Intravenous Q8H Oswald Hillock, MD    500 mg at 05/30/16 8676  . ondansetron (ZOFRAN) tablet 4 mg  4 mg Oral Q6H PRN Oswald Hillock, MD       Or  . ondansetron (ZOFRAN) injection 4 mg  4 mg Intravenous Q6H PRN Oswald Hillock, MD        Allergies as of 05/29/2016 - Review Complete 05/29/2016  Allergen Reaction Noted  . Codeine Nausea And Vomiting and Palpitations 11/10/2008    Family History  Problem Relation Age of Onset  . Leukemia Brother   . Heart attack Brother   . Lung cancer Brother   . Colon cancer Brother     less than age 63.   [name] Leukemia Sister     Social History   Social History  . Marital status: Married    Spouse name: N/A  . Number of children: N/A  . Years of education: N/A   Occupational History  . Not on file.   Social History Main Topics  . Smoking status: Former Smoker    Packs/day: 2.50    Years: 35.00    Types: Cigarettes    Start date: 11/22/1958    Quit date: 11/21/1993  . Smokeless tobacco: Never Used  . Alcohol use 0.0 oz/week     Comment: occasional beer  . Drug use: No  . Sexual activity: Not on file   Other Topics Concern  . Not on file   Social History Narrative  . No narrative on file    Review of Systems: Gen: see HPI  CV: Denies chest pain, heart palpitations, syncope, edema  Resp: Denies shortness of breath with rest, cough, wheezing GI: see HPI  GU : Denies urinary burning, urinary frequency, urinary incontinence.  MS: +arthritis  Derm: Denies rash, itching, dry skin Psych: Denies depression, anxiety,confusion, or memory loss Heme: see HPI   Physical Exam: Vital signs in last 24 hours: Temp:  [98.3 F (36.8 C)-98.4 F (36.9 C)] 98.3 F (36.8 C) (04/09 0300) Pulse Rate:  [64-79] 67 (04/09 0300) Resp:  [16-18] 17 (04/09 0300) BP: (138-171)/(79-93) 160/85 (04/09 0300) SpO2:  [96 %-100 %] 98 % (04/09 0300) Weight:  [202 lb 9.6 oz (91.9 kg)-204 lb (92.5 kg)] 202 lb 9.6 oz (91.9 kg) (04/09 0105) Last BM Date: 05/30/16 General:   Alert,  Well-developed,  well-nourished, pleasant and cooperative in NAD Head:  Normocephalic and atraumatic. Eyes:  Sclera clear, no icterus.   Conjunctiva pink. Ears:  Normal auditory acuity. Nose:  No deformity, discharge,  or lesions. Mouth:  No deformity or lesions, dentition normal. Lungs:  Clear throughout to auscultation.   No wheezes, crackles, or rhonchi. No acute distress. Heart:  Regular rate and rhythm; audible click  Abdomen:  Soft, moderate TTP LLQ and nondistended. No masses, hepatosplenomegaly or hernias noted. Normal bowel sounds, without guarding, and without rebound.   Rectal:  Deferred  Msk:  Symmetrical without gross deformities. Normal posture. Extremities:  Without  edema. Neurologic:  Alert and  oriented x4 Psych:  Alert and cooperative. Normal mood and affect.  Intake/Output from previous day: 04/08 0701 - 04/09 0700 In: 1015 [I.V.:115; IV Piggyback:900] Out: 300 [Urine:300] Intake/Output this shift: Total I/O In: 803.8 [P.O.:240; I.V.:413.8; IV Piggyback:150] Out: 250 [Urine:250]  Lab Results:  Recent Labs  05/29/16 2031 05/29/16 2106 05/30/16 0533  WBC 13.3*  --  13.1*  HGB 14.4 14.6 13.4  HCT 42.3 43.0 40.1  PLT 259  --  241   BMET  Recent Labs  05/29/16 2031 05/29/16 2106 05/30/16 0533  NA 139 141 138  K 3.6 3.8 3.7  CL 107 108 107  CO2 24  --  22  GLUCOSE 99 98 123*  BUN 23* 26* 19  CREATININE 1.66* 1.70* 1.53*  CALCIUM 9.4  --  8.9   LFT  Recent Labs  05/29/16 2031 05/30/16 0533  PROT 7.8 7.0  ALBUMIN 4.3 3.7  AST 29 25  ALT 22 19  ALKPHOS 56 49  BILITOT 0.4 0.4   PT/INR  Recent Labs  05/29/16 2031 05/30/16 0533  LABPROT 85.8* 86.6*  INR >10.01* 10.50*    Studies/Results: Ct Abdomen Pelvis W Contrast  Result Date: 05/29/2016 CLINICAL DATA:  Acute onset of pink mucus arising from the rectum, with sharp left lower quadrant abdominal pain. Initial encounter. EXAM: CT ABDOMEN AND PELVIS WITH CONTRAST TECHNIQUE: Multidetector CT imaging  of the abdomen and pelvis was performed using the standard protocol following bolus administration of intravenous contrast. CONTRAST:  159mL ISOVUE-300 IOPAMIDOL (ISOVUE-300) INJECTION 61% COMPARISON:  CT of the abdomen and pelvis performed 07/05/2013 FINDINGS: Lower chest: An aortic valve replacement is noted. Scattered blebs are noted at the left lung base. Hepatobiliary: The liver is unremarkable in appearance. The gallbladder is unremarkable in appearance. The common bile duct remains normal in caliber. Pancreas: The pancreas is within normal limits. Spleen: A tiny calcified granuloma is noted within the spleen. The spleen is otherwise unremarkable. Adrenals/Urinary Tract: The adrenal glands are unremarkable in appearance. Nonspecific perinephric stranding is noted bilaterally. There is no evidence of hydronephrosis. No renal or ureteral stones are identified. Stomach/Bowel: The stomach is unremarkable in appearance. The small bowel is within normal limits. The appendix is normal in caliber, without evidence of appendicitis. Marked wall thickening is noted at the proximal sigmoid colon, with inflamed diverticula and surrounding soft inflammation. There is no definite evidence of perforation or abscess formation. This is concerning for acute diverticulitis, though focal colitis might have a similar appearance. There also appears to be an enhancing vessel tracking into the rectum, with suspicion of internal hemorrhoids. Vascular/Lymphatic: Diffuse calcification is seen along the abdominal aorta and its branches. The abdominal aorta is otherwise grossly unremarkable. The inferior vena cava is grossly unremarkable. No retroperitoneal lymphadenopathy is seen. No pelvic sidewall lymphadenopathy is identified. Reproductive: The prostate is enlarged, measuring 5.1 cm in transverse dimension, with scattered calcification. There is impression on the base of the bladder from the prostate. The bladder is mildly distended  and grossly unremarkable. Other: A moderate left inguinal hernia is seen, containing only fat. Musculoskeletal: No acute osseous abnormalities are identified. The visualized musculature is unremarkable in appearance. IMPRESSION: 1. Marked wall thickening at the proximal sigmoid colon, with inflamed diverticula and  surrounding soft tissue inflammation, concerning for acute diverticulitis, though focal colitis might have a similar appearance. No definite evidence of perforation or abscess formation at this time. 2. Apparent enhancing vessel noted tracking into the rectum, with suspicion of internal hemorrhoids. 3. Diffuse aortic atherosclerosis. 4. Enlarged prostate. 5. Moderate left inguinal hernia, containing only fat. 6. Scattered blebs at the left lung base. Electronically Signed   By: Garald Balding M.D.   On: 05/29/2016 22:14    Impression: 71 year old pleasant male presenting with abdominal pain and pink-tinged mucus in setting of INR greater than 10 on Coumadin. CT findings of acute diverticulitis, possible focal colitis. Overt rectal bleeding improving and Hgb with just mild drift since admission. Clinically is improving since admission on Cipro and Flagyl. Last colonoscopy by Dr. Laural Golden fairly recent in 2016 with tubular adenomas. As he is improving, continue supportive measures, IV antibiotics for now, and will advance diet to full liquids. May advance to low-residue as tolerated. Further management per Dr. Laural Golden tomorrow, 05/31/16.    Plan: Full liquid diet today, hopefully advancing to low-residue if he continues to improve Continue Cipro/Flagyl Follow H/H Monitor for any persistent/worsening GI bleeding Cardiology following, pharmacy following INR Consider outpatient colonoscopy. Hold on inpatient procedures unless clinically indicated   Annitta Needs, ANP-BC [facility]      LOS: 0 days    05/30/2016, 12:58 PM

## 2016-05-30 NOTE — Care Management Note (Signed)
Case Management Note  Patient Details  Name: Stephen Moses MRN: [id number] Date of Birth: 1946-01-27  Subjective/Objective: Adm with rectal bleeding/diverticulitis/INR>10. From home, ind with ADL's. No DME or HH PTA. Has PCP, still drives, reports no issues affording medications.                   Action/Plan: Anticipate DC home with self care.    Expected Discharge Date:  05/31/16               Expected Discharge Plan:  Home/Self Care  In-House Referral:     Discharge planning Services  CM Consult  Post Acute Care Choice:    Choice offered to:  NA  DME Arranged:    DME Agency:     HH Arranged:    HH Agency:     Status of Service:  Completed, signed off  If discussed at [employer] of Stay Meetings, dates discussed:    Additional Comments:  Marquite Attwood, Chauncey Reading, RN 05/30/2016, 2:12 PM

## 2016-05-31 ENCOUNTER — Encounter (INDEPENDENT_AMBULATORY_CARE_PROVIDER_SITE_OTHER): Payer: Self-pay | Admitting: *Deleted

## 2016-05-31 ENCOUNTER — Telehealth: Payer: Self-pay | Admitting: Gastroenterology

## 2016-05-31 DIAGNOSIS — Z79899 Other long term (current) drug therapy: Secondary | ICD-10-CM | POA: Diagnosis not present

## 2016-05-31 DIAGNOSIS — K5790 Diverticulosis of intestine, part unspecified, without perforation or abscess without bleeding: Secondary | ICD-10-CM | POA: Diagnosis not present

## 2016-05-31 DIAGNOSIS — Z885 Allergy status to narcotic agent status: Secondary | ICD-10-CM | POA: Diagnosis not present

## 2016-05-31 DIAGNOSIS — N183 Chronic kidney disease, stage 3 (moderate): Secondary | ICD-10-CM | POA: Diagnosis present

## 2016-05-31 DIAGNOSIS — I712 Thoracic aortic aneurysm, without rupture: Secondary | ICD-10-CM | POA: Diagnosis present

## 2016-05-31 DIAGNOSIS — Z87891 Personal history of nicotine dependence: Secondary | ICD-10-CM | POA: Diagnosis not present

## 2016-05-31 DIAGNOSIS — K559 Vascular disorder of intestine, unspecified: Secondary | ICD-10-CM | POA: Diagnosis present

## 2016-05-31 DIAGNOSIS — Q231 Congenital insufficiency of aortic valve: Secondary | ICD-10-CM | POA: Diagnosis not present

## 2016-05-31 DIAGNOSIS — K5792 Diverticulitis of intestine, part unspecified, without perforation or abscess without bleeding: Secondary | ICD-10-CM | POA: Diagnosis not present

## 2016-05-31 DIAGNOSIS — I129 Hypertensive chronic kidney disease with stage 1 through stage 4 chronic kidney disease, or unspecified chronic kidney disease: Secondary | ICD-10-CM | POA: Diagnosis present

## 2016-05-31 DIAGNOSIS — K625 Hemorrhage of anus and rectum: Secondary | ICD-10-CM | POA: Diagnosis not present

## 2016-05-31 DIAGNOSIS — K529 Noninfective gastroenteritis and colitis, unspecified: Secondary | ICD-10-CM | POA: Diagnosis present

## 2016-05-31 DIAGNOSIS — K5732 Diverticulitis of large intestine without perforation or abscess without bleeding: Secondary | ICD-10-CM | POA: Diagnosis present

## 2016-05-31 DIAGNOSIS — Z952 Presence of prosthetic heart valve: Secondary | ICD-10-CM | POA: Diagnosis not present

## 2016-05-31 DIAGNOSIS — R791 Abnormal coagulation profile: Secondary | ICD-10-CM | POA: Diagnosis present

## 2016-05-31 DIAGNOSIS — Z7901 Long term (current) use of anticoagulants: Secondary | ICD-10-CM | POA: Diagnosis not present

## 2016-05-31 DIAGNOSIS — Z8249 Family history of ischemic heart disease and other diseases of the circulatory system: Secondary | ICD-10-CM | POA: Diagnosis not present

## 2016-05-31 DIAGNOSIS — Z8679 Personal history of other diseases of the circulatory system: Secondary | ICD-10-CM | POA: Diagnosis not present

## 2016-05-31 DIAGNOSIS — Z87442 Personal history of urinary calculi: Secondary | ICD-10-CM | POA: Diagnosis not present

## 2016-05-31 LAB — CBC
HCT: 37 % — ABNORMAL LOW (ref 39.0–52.0)
HEMOGLOBIN: 12.4 g/dL — AB (ref 13.0–17.0)
MCH: 28.3 pg (ref 26.0–34.0)
MCHC: 33.5 g/dL (ref 30.0–36.0)
MCV: 84.5 fL (ref 78.0–100.0)
Platelets: 200 10*3/uL (ref 150–400)
RBC: 4.38 MIL/uL (ref 4.22–5.81)
RDW: 14.4 % (ref 11.5–15.5)
WBC: 8.7 10*3/uL (ref 4.0–10.5)

## 2016-05-31 LAB — PROTIME-INR
INR: 1.75
Prothrombin Time: 20.7 seconds — ABNORMAL HIGH (ref 11.4–15.2)

## 2016-05-31 MED ORDER — WARFARIN SODIUM 5 MG PO TABS
5.0000 mg | ORAL_TABLET | Freq: Once | ORAL | Status: AC
  Administered 2016-05-31: 5 mg via ORAL
  Filled 2016-05-31: qty 1

## 2016-05-31 MED ORDER — WARFARIN - PHARMACIST DOSING INPATIENT
Status: DC
  Administered 2016-05-31: 16:00:00

## 2016-05-31 NOTE — Telephone Encounter (Signed)
appt sch'd 07/19/16, letter mailed to patient

## 2016-05-31 NOTE — Progress Notes (Signed)
[facility] for Coumadin Indication: AVR  Allergies  Allergen Reactions  . Codeine Nausea And Vomiting and Palpitations    Patient Measurements: Height: 5\' 8"  (172.7 cm) Weight: 202 lb 9.6 oz (91.9 kg) IBW/kg (Calculated) : 68.4  Vital Signs: Temp: 97.5 F (36.4 C) (04/10 0500) Temp Source: Oral (04/10 0500) BP: 116/69 (04/10 0500) Pulse Rate: 65 (04/10 0500)  Labs:  Recent Labs  05/29/16 2031 05/29/16 2106 05/30/16 0533 05/30/16 2148 05/31/16 0549  HGB 14.4 14.6 13.4  --  12.4*  HCT 42.3 43.0 40.1  --  37.0*  PLT 259  --  241  --  200  LABPROT 85.8*  --  86.6* 24.1* 20.7*  INR >10.01*  --  10.50* 2.12 1.75  CREATININE 1.66* 1.70* 1.53*  --   --     Estimated Creatinine Clearance: 50.1 mL/min (A) (by C-G formula based on SCr of 1.53 mg/dL (H)).   Medical History: Past Medical History:  Diagnosis Date  . AORTIC VALVE REPLACEMENT, HX OF 07/06/2009   Qualifier: Diagnosis of  By: Purcell Nails, NP, Curt Bears    . Ascending aortic aneurysm ([place]) 09/16/2011  . Calculus of kidney   . Dizziness   . Leukocytosis 05/17/2015  . Long term current use of anticoagulant 05/30/2010  . Normocytic anemia 05/18/2015  . Pancytopenia ([place]) 05/18/2015  . Paraspinal muscle spasm   . Thrombocytopenia ([place]) 05/18/2015  . Unspecified essential hypertension     Medications:  Prescriptions Prior to Admission  Medication Sig Dispense Refill Last Dose  . acetaminophen (TYLENOL) 500 MG tablet Take 500 mg by mouth every 6 (six) hours as needed for mild pain or moderate pain.   05/30/2016 at Unknown time  . amLODipine (NORVASC) 5 MG tablet Take 5 mg by mouth at bedtime.    05/29/2016 at Unknown time  . metoprolol succinate (TOPROL-XL) 25 MG 24 hr tablet Take 25 mg by mouth at bedtime.    05/29/2016 at 1630  . warfarin (COUMADIN) 10 MG tablet Take 1/2 tablet daily or as directed (Patient taking differently: Take 5 mg by mouth daily. ) 90 tablet 3 05/29/2016 at 1630     Assessment: 71 yo male admitted with rectal bleeding. He is on chronic coumadin tx for aortic valve replacement. Also, has h/o HTN and ascending aortic aneurysm. Patient presents to hospital with 12 bouts of frothy pinkish BM. CT consistent with diverticulitis. INR was significantly elevated >10. Vitamin K 10mg  SQ  was given at 2200 4/8 and Vitamin K 5mg  IV was given on 4/9 at 10am. INR this AM < 2.    H/H OK, no further bleeding reported.   Goal of Therapy:  INR 2.5-3.5 Monitor platelets by anticoagulation protocol: Yes   Plan:  Restart Coumadin when INR <3.5 per MD instructions Coumadin 5mg  today x 1 PT-INR daily Monitor for bleeding  Hart Robinsons, PharmD Clinical Pharmacist Pager:  [phone] 05/31/2016    05/31/2016,11:12 AM

## 2016-05-31 NOTE — Progress Notes (Signed)
Subjective: Abdominal pain improved significantly. No overt GI bleeding. A few mucus-like stools overnight but improving. Tolerating diet.   Objective: Vital signs in last 24 hours: Temp:  [97.5 F (36.4 C)-98.3 F (36.8 C)] 97.5 F (36.4 C) (04/10 0500) Pulse Rate:  [63-65] 65 (04/10 0500) Resp:  [18-20] 20 (04/10 0500) BP: (116-129)/(69-74) 116/69 (04/10 0500) SpO2:  [96 %-98 %] 96 % (04/10 0500) Last BM Date: 05/30/16 General:   Alert and oriented, pleasant Head:  Normocephalic and atraumatic. Eyes:  No icterus, sclera clear. Conjuctiva pink.  Abdomen:  Bowel sounds present, soft, mild TTP LLQ, no rebound or guarding, non-distended. Neurologic:  Alert and  oriented x4 Psych:  Alert and cooperative. Normal mood and affect.  Intake/Output from previous day: 04/09 0701 - 04/10 0700 In: 1945.4 [P.O.:720; I.V.:775.4; IV Piggyback:450] Out: 1150 [Urine:1150] Intake/Output this shift: No intake/output data recorded.  Lab Results:  Recent Labs  05/29/16 2031 05/29/16 2106 05/30/16 0533 05/31/16 0549  WBC 13.3*  --  13.1* 8.7  HGB 14.4 14.6 13.4 12.4*  HCT 42.3 43.0 40.1 37.0*  PLT 259  --  241 200   BMET  Recent Labs  05/29/16 2031 05/29/16 2106 05/30/16 0533  NA 139 141 138  K 3.6 3.8 3.7  CL 107 108 107  CO2 24  --  22  GLUCOSE 99 98 123*  BUN 23* 26* 19  CREATININE 1.66* 1.70* 1.53*  CALCIUM 9.4  --  8.9   LFT  Recent Labs  05/29/16 2031 05/30/16 0533  PROT 7.8 7.0  ALBUMIN 4.3 3.7  AST 29 25  ALT 22 19  ALKPHOS 56 49  BILITOT 0.4 0.4   PT/INR  Recent Labs  05/30/16 2148 05/31/16 0549  LABPROT 24.1* 20.7*  INR 2.12 1.75     Studies/Results: Ct Abdomen Pelvis W Contrast  Result Date: 05/29/2016 CLINICAL DATA:  Acute onset of pink mucus arising from the rectum, with sharp left lower quadrant abdominal pain. Initial encounter. EXAM: CT ABDOMEN AND PELVIS WITH CONTRAST TECHNIQUE: Multidetector CT imaging of the abdomen and pelvis was  performed using the standard protocol following bolus administration of intravenous contrast. CONTRAST:  114mL ISOVUE-300 IOPAMIDOL (ISOVUE-300) INJECTION 61% COMPARISON:  CT of the abdomen and pelvis performed 07/05/2013 FINDINGS: Lower chest: An aortic valve replacement is noted. Scattered blebs are noted at the left lung base. Hepatobiliary: The liver is unremarkable in appearance. The gallbladder is unremarkable in appearance. The common bile duct remains normal in caliber. Pancreas: The pancreas is within normal limits. Spleen: A tiny calcified granuloma is noted within the spleen. The spleen is otherwise unremarkable. Adrenals/Urinary Tract: The adrenal glands are unremarkable in appearance. Nonspecific perinephric stranding is noted bilaterally. There is no evidence of hydronephrosis. No renal or ureteral stones are identified. Stomach/Bowel: The stomach is unremarkable in appearance. The small bowel is within normal limits. The appendix is normal in caliber, without evidence of appendicitis. Marked wall thickening is noted at the proximal sigmoid colon, with inflamed diverticula and surrounding soft inflammation. There is no definite evidence of perforation or abscess formation. This is concerning for acute diverticulitis, though focal colitis might have a similar appearance. There also appears to be an enhancing vessel tracking into the rectum, with suspicion of internal hemorrhoids. Vascular/Lymphatic: Diffuse calcification is seen along the abdominal aorta and its branches. The abdominal aorta is otherwise grossly unremarkable. The inferior vena cava is grossly unremarkable. No retroperitoneal lymphadenopathy is seen. No pelvic sidewall lymphadenopathy is identified. Reproductive: The prostate is  enlarged, measuring 5.1 cm in transverse dimension, with scattered calcification. There is impression on the base of the bladder from the prostate. The bladder is mildly distended and grossly unremarkable. Other:  A moderate left inguinal hernia is seen, containing only fat. Musculoskeletal: No acute osseous abnormalities are identified. The visualized musculature is unremarkable in appearance. IMPRESSION: 1. Marked wall thickening at the proximal sigmoid colon, with inflamed diverticula and surrounding soft tissue inflammation, concerning for acute diverticulitis, though focal colitis might have a similar appearance. No definite evidence of perforation or abscess formation at this time. 2. Apparent enhancing vessel noted tracking into the rectum, with suspicion of internal hemorrhoids. 3. Diffuse aortic atherosclerosis. 4. Enlarged prostate. 5. Moderate left inguinal hernia, containing only fat. 6. Scattered blebs at the left lung base. Electronically Signed   By: Garald Balding M.D.   On: 05/29/2016 22:14    Assessment: 71 year old male admitted with abdominal pain and pink-tinged mucus in setting of INR greater than 10 on Coumadin. Presentation most consistent with ischemic or segmental colitis. Agree with empiric antibiotics. He has improved significantly since admission, with tapered bleeding since admission and now without any overt GI bleeding. Agree with advanced diet. Hopeful discharge soon once INR therapeutic; he is stable from a GI standpoint. Dr. Laural Golden to assess tomorrow morning. Consider outpatient colonoscopy.     Plan: Will follow peripherally Consider outpatient colonoscopy Dr. Laural Golden to see tomorrow and as outpatient for follow-up.   Annitta Needs, ANP-BC [facility]    LOS: 0 days    05/31/2016, 8:12 AM

## 2016-05-31 NOTE — Progress Notes (Signed)
Subjective: He states that his abdominal pain is much improved. He had 3 mucus-containing stools overnight. No fever. He is hungry. He denies any signs of any increased bleeding.  Objective: Vital signs in last 24 hours: Vitals:   05/30/16 0300 05/30/16 1516 05/30/16 2100 05/31/16 0500  BP: (!) 160/85 123/70 129/74 116/69  Pulse: 67 63 65 65  Resp: 17 20 18 20   Temp: 98.3 F (36.8 C) 98.1 F (36.7 C) 98.3 F (36.8 C) 97.5 F (36.4 C)  TempSrc: Oral Oral Oral Oral  SpO2: 98% 97% 98% 96%  Weight:      Height:       Weight change:   Intake/Output Summary (Last 24 hours) at 05/31/16 0744 Last data filed at 05/31/16 0500  Gross per 24 hour  Intake          1945.42 ml  Output             1150 ml  Net           795.42 ml    Physical Exam: No distress. Lungs clear. Heart regular at 68 bpm. Abdomen soft and nondistended with minimal left lower quadrant tenderness. Extremities reveal no edema.  Lab Results:    Results for orders placed or performed during the hospital encounter of 05/29/16 (from the past 24 hour(s))  Protime-INR     Status: Abnormal   Collection Time: 05/30/16  9:48 PM  Result Value Ref Range   Prothrombin Time 24.1 (H) 11.4 - 15.2 seconds   INR 2.12   CBC     Status: Abnormal   Collection Time: 05/31/16  5:49 AM  Result Value Ref Range   WBC 8.7 4.0 - 10.5 K/uL   RBC 4.38 4.22 - 5.81 MIL/uL   Hemoglobin 12.4 (L) 13.0 - 17.0 g/dL   HCT 37.0 (L) 39.0 - 52.0 %   MCV 84.5 78.0 - 100.0 fL   MCH 28.3 26.0 - 34.0 pg   MCHC 33.5 30.0 - 36.0 g/dL   RDW 14.4 11.5 - 15.5 %   Platelets 200 150 - 400 K/uL  Protime-INR     Status: Abnormal   Collection Time: 05/31/16  5:49 AM  Result Value Ref Range   Prothrombin Time 20.7 (H) 11.4 - 15.2 seconds   INR 1.75      ABGS  Recent Labs  05/29/16 2106  TCO2 26   CULTURES No results found for this or any previous visit (from the past 240 hour(s)). Studies/Results: Ct Abdomen Pelvis W Contrast  Result Date:  05/29/2016 CLINICAL DATA:  Acute onset of pink mucus arising from the rectum, with sharp left lower quadrant abdominal pain. Initial encounter. EXAM: CT ABDOMEN AND PELVIS WITH CONTRAST TECHNIQUE: Multidetector CT imaging of the abdomen and pelvis was performed using the standard protocol following bolus administration of intravenous contrast. CONTRAST:  191mL ISOVUE-300 IOPAMIDOL (ISOVUE-300) INJECTION 61% COMPARISON:  CT of the abdomen and pelvis performed 07/05/2013 FINDINGS: Lower chest: An aortic valve replacement is noted. Scattered blebs are noted at the left lung base. Hepatobiliary: The liver is unremarkable in appearance. The gallbladder is unremarkable in appearance. The common bile duct remains normal in caliber. Pancreas: The pancreas is within normal limits. Spleen: A tiny calcified granuloma is noted within the spleen. The spleen is otherwise unremarkable. Adrenals/Urinary Tract: The adrenal glands are unremarkable in appearance. Nonspecific perinephric stranding is noted bilaterally. There is no evidence of hydronephrosis. No renal or ureteral stones are identified. Stomach/Bowel: The stomach is unremarkable in appearance.  The small bowel is within normal limits. The appendix is normal in caliber, without evidence of appendicitis. Marked wall thickening is noted at the proximal sigmoid colon, with inflamed diverticula and surrounding soft inflammation. There is no definite evidence of perforation or abscess formation. This is concerning for acute diverticulitis, though focal colitis might have a similar appearance. There also appears to be an enhancing vessel tracking into the rectum, with suspicion of internal hemorrhoids. Vascular/Lymphatic: Diffuse calcification is seen along the abdominal aorta and its branches. The abdominal aorta is otherwise grossly unremarkable. The inferior vena cava is grossly unremarkable. No retroperitoneal lymphadenopathy is seen. No pelvic sidewall lymphadenopathy is  identified. Reproductive: The prostate is enlarged, measuring 5.1 cm in transverse dimension, with scattered calcification. There is impression on the base of the bladder from the prostate. The bladder is mildly distended and grossly unremarkable. Other: A moderate left inguinal hernia is seen, containing only fat. Musculoskeletal: No acute osseous abnormalities are identified. The visualized musculature is unremarkable in appearance. IMPRESSION: 1. Marked wall thickening at the proximal sigmoid colon, with inflamed diverticula and surrounding soft tissue inflammation, concerning for acute diverticulitis, though focal colitis might have a similar appearance. No definite evidence of perforation or abscess formation at this time. 2. Apparent enhancing vessel noted tracking into the rectum, with suspicion of internal hemorrhoids. 3. Diffuse aortic atherosclerosis. 4. Enlarged prostate. 5. Moderate left inguinal hernia, containing only fat. 6. Scattered blebs at the left lung base. Electronically Signed   By: Garald Balding M.D.   On: 05/29/2016 22:14   Micro Results: No results found for this or any previous visit (from the past 240 hour(s)). Studies/Results: Ct Abdomen Pelvis W Contrast  Result Date: 05/29/2016 CLINICAL DATA:  Acute onset of pink mucus arising from the rectum, with sharp left lower quadrant abdominal pain. Initial encounter. EXAM: CT ABDOMEN AND PELVIS WITH CONTRAST TECHNIQUE: Multidetector CT imaging of the abdomen and pelvis was performed using the standard protocol following bolus administration of intravenous contrast. CONTRAST:  161mL ISOVUE-300 IOPAMIDOL (ISOVUE-300) INJECTION 61% COMPARISON:  CT of the abdomen and pelvis performed 07/05/2013 FINDINGS: Lower chest: An aortic valve replacement is noted. Scattered blebs are noted at the left lung base. Hepatobiliary: The liver is unremarkable in appearance. The gallbladder is unremarkable in appearance. The common bile duct remains normal in  caliber. Pancreas: The pancreas is within normal limits. Spleen: A tiny calcified granuloma is noted within the spleen. The spleen is otherwise unremarkable. Adrenals/Urinary Tract: The adrenal glands are unremarkable in appearance. Nonspecific perinephric stranding is noted bilaterally. There is no evidence of hydronephrosis. No renal or ureteral stones are identified. Stomach/Bowel: The stomach is unremarkable in appearance. The small bowel is within normal limits. The appendix is normal in caliber, without evidence of appendicitis. Marked wall thickening is noted at the proximal sigmoid colon, with inflamed diverticula and surrounding soft inflammation. There is no definite evidence of perforation or abscess formation. This is concerning for acute diverticulitis, though focal colitis might have a similar appearance. There also appears to be an enhancing vessel tracking into the rectum, with suspicion of internal hemorrhoids. Vascular/Lymphatic: Diffuse calcification is seen along the abdominal aorta and its branches. The abdominal aorta is otherwise grossly unremarkable. The inferior vena cava is grossly unremarkable. No retroperitoneal lymphadenopathy is seen. No pelvic sidewall lymphadenopathy is identified. Reproductive: The prostate is enlarged, measuring 5.1 cm in transverse dimension, with scattered calcification. There is impression on the base of the bladder from the prostate. The bladder is mildly distended and  grossly unremarkable. Other: A moderate left inguinal hernia is seen, containing only fat. Musculoskeletal: No acute osseous abnormalities are identified. The visualized musculature is unremarkable in appearance. IMPRESSION: 1. Marked wall thickening at the proximal sigmoid colon, with inflamed diverticula and surrounding soft tissue inflammation, concerning for acute diverticulitis, though focal colitis might have a similar appearance. No definite evidence of perforation or abscess formation at  this time. 2. Apparent enhancing vessel noted tracking into the rectum, with suspicion of internal hemorrhoids. 3. Diffuse aortic atherosclerosis. 4. Enlarged prostate. 5. Moderate left inguinal hernia, containing only fat. 6. Scattered blebs at the left lung base. Electronically Signed   By: Garald Balding M.D.   On: 05/29/2016 22:14   Medications:  I have reviewed the patient's current medications Scheduled Meds: . amLODipine  5 mg Oral QHS  . ciprofloxacin  400 mg Intravenous Q12H  . metoprolol succinate  25 mg Oral QHS  . metronidazole  500 mg Intravenous Q8H   Continuous Infusions: PRN Meds:.acetaminophen **OR** acetaminophen, acetaminophen, hydrALAZINE, ondansetron **OR** ondansetron (ZOFRAN) IV   Assessment/Plan: #1.  Colitis. Symptoms improving. White count normalized. Hemoglobin 12.4. Continue Cipro and Flagyl.  Advanced diet. #2. Elevated INR. Decreased to 2.1 then 1.7 after vitamin K. Plan restarting Coumadin per cardiology. Active Problems:   Aortic valve disorder   Long term current use of anticoagulant   Ascending aortic aneurysm ([facility]CC)   Rectal bleeding   Diverticulitis     LOS: 0 days   Stephen Moses 05/31/2016, 7:44 AM

## 2016-05-31 NOTE — Telephone Encounter (Signed)
Please arrange outpatient follow-up with Dr. Laural Golden or Karna Christmas in 6-8 weeks. Recently inpatient with colitis.

## 2016-06-01 DIAGNOSIS — K625 Hemorrhage of anus and rectum: Secondary | ICD-10-CM

## 2016-06-01 DIAGNOSIS — K5792 Diverticulitis of intestine, part unspecified, without perforation or abscess without bleeding: Secondary | ICD-10-CM

## 2016-06-01 DIAGNOSIS — Z7901 Long term (current) use of anticoagulants: Secondary | ICD-10-CM

## 2016-06-01 LAB — PROTIME-INR
INR: 1.7
PROTHROMBIN TIME: 20.2 s — AB (ref 11.4–15.2)

## 2016-06-01 MED ORDER — ENOXAPARIN SODIUM 150 MG/ML ~~LOC~~ SOLN
130.0000 mg | SUBCUTANEOUS | Status: DC
  Filled 2016-06-01 (×3): qty 0.87

## 2016-06-01 MED ORDER — ENOXAPARIN SODIUM 150 MG/ML ~~LOC~~ SOLN: 130.0000 mg | SUBCUTANEOUS | 0 refills | Status: DC

## 2016-06-01 MED ORDER — ENOXAPARIN SODIUM 120 MG/0.8ML ~~LOC~~ SOLN: 120.0000 mg | SUBCUTANEOUS | 0 refills | Status: DC

## 2016-06-01 MED ORDER — ENOXAPARIN SODIUM 120 MG/0.8ML ~~LOC~~ SOLN
120.0000 mg | SUBCUTANEOUS | Status: DC
  Administered 2016-06-01: 120 mg via SUBCUTANEOUS
  Filled 2016-06-01 (×3): qty 0.8

## 2016-06-01 NOTE — Care Management Note (Signed)
Case Management Note  Patient Details  Name: Stephen Moses MRN: [id number] Date of Birth: 01/03/1946  Expected Discharge Date:  06/01/16               Expected Discharge Plan:  Home/Self Care  In-House Referral:  NA  Discharge planning Services  CM Consult  Post Acute Care Choice:  NA Choice offered to:  NA  Status of Service:  Completed, signed off  Additional Comments: Pt discharging home today with self care.   Sherald Barge, RN 06/01/2016, 8:59 AM

## 2016-06-01 NOTE — Progress Notes (Signed)
Patient ID: Stephen Moses, male   DOB: [birth year], 71 y.o.   MRN: [id number] States he feels good. Tolerating diet well. He denies any abdominal pain. Last BM this am. Stool brown in color. No blood per patient.  Blood pressure 122/74, pulse 62, temperature 98.2 F (36.8 C), temperature source Oral, resp. rate 20, height 5\' 8"  (1.727 m), weight 202 lb 9.6 oz (91.9 kg), SpO2 97 %. His last colonoscopy was in 2016 with 2 small polyps removed. Mild sigmoid diverticulosis.  Patient had 2 polyps removed and both are tubular adenomas. Has f/u visit in our office 07/19/2016.

## 2016-06-01 NOTE — Progress Notes (Signed)
Patient discharging home.  IV removed - WNL.  Reviewed DC instructions and medications - wife already knowledgeable on lovenox injection administration from past.  Follow up in place.  No questions at this time - verbalizes understanding.  Patient in NAD at this time

## 2016-06-01 NOTE — Discharge Summary (Signed)
Physician Discharge Summary  Stephen Moses WLN:[id number] DOB: 05/28/1945 DOA: 05/29/2016   Admit date: 05/29/2016 Discharge date: 06/01/2016  Discharge Diagnoses:  Active Problems:   Aortic valve disorder   Long term current use of anticoagulant   Ascending aortic aneurysm (HCC)   Rectal bleeding   Diverticulitis    Wt Readings from Last 3 Encounters:  05/30/16 202 lb 9.6 oz (91.9 kg)  04/18/16 204 lb (92.5 kg)  02/19/16 200 lb (90.7 kg)     Hospital Course:  This patient is a 71 year old male with a history of an aortic valve replacement who presented with acute onset of left lower quadrant pain and some pink tinged mucus-like stools. Hemoglobin was normal. INR was greater than 10. Coumadin was held. Vitamin K was administered. His INR returned to 1.7. A CT scan of his abdomen revealed inflammatory changes originally felt to possibly be diverticulitis versus colitis. He was seen in consultation by gastroenterology. He was felt to have ischemic colitis. He was treated initially with Cipro and Flagyl. His leukocytosis resolved. He had no fever. His bleeding resolved. His diet was advanced without complication. Coumadin was restarted.  He was much improved and stable for discharge on the morning of April 11. He will be seen in follow-up in my office in one week. He will continue his Coumadin at 5 mg daily. He will be treated with Lovenox 130 mg daily over the next 5 days.  Case will be discussed further with his Coumadin clinic staff. Cardiology was consulted during hospitalization as well.   Discharge Instructions  Discharge Instructions    Diet - low sodium heart healthy    Complete by:  As directed    Increase activity slowly    Complete by:  As directed      Allergies as of 06/01/2016      Reactions   Codeine Nausea And Vomiting, Palpitations      Medication List    TAKE these medications   acetaminophen 500 MG tablet Commonly known as:  TYLENOL Take 500 mg by mouth  every 6 (six) hours as needed for mild pain or moderate pain.   amLODipine 5 MG tablet Commonly known as:  NORVASC Take 5 mg by mouth at bedtime.   enoxaparin 150 MG/ML injection Commonly known as:  LOVENOX Inject 0.87 mLs (130 mg total) into the skin daily.   metoprolol succinate 25 MG 24 hr tablet Commonly known as:  TOPROL-XL Take 25 mg by mouth at bedtime.   warfarin 10 MG tablet Commonly known as:  COUMADIN Take 1/2 tablet daily or as directed What changed:  how much to take  how to take this  when to take this  additional instructions        Stephen Moses 06/01/2016

## 2016-06-01 NOTE — Care Management Important Message (Signed)
Important Message  Patient Details  Name: VEDANSH KERSTETTER MRN: [id number] Date of Birth: 1946-01-30   Medicare Important Message Given:  Yes    Sherald Barge, RN 06/01/2016, 9:00 AM

## 2016-06-06 ENCOUNTER — Ambulatory Visit (INDEPENDENT_AMBULATORY_CARE_PROVIDER_SITE_OTHER): Payer: Medicare Other | Admitting: *Deleted

## 2016-06-06 DIAGNOSIS — Z5181 Encounter for therapeutic drug level monitoring: Secondary | ICD-10-CM

## 2016-06-06 DIAGNOSIS — Z952 Presence of prosthetic heart valve: Secondary | ICD-10-CM

## 2016-06-06 DIAGNOSIS — I359 Nonrheumatic aortic valve disorder, unspecified: Secondary | ICD-10-CM | POA: Diagnosis not present

## 2016-06-06 LAB — POCT INR: INR: 3.8

## 2016-06-10 DIAGNOSIS — I10 Essential (primary) hypertension: Secondary | ICD-10-CM | POA: Diagnosis not present

## 2016-06-10 DIAGNOSIS — E785 Hyperlipidemia, unspecified: Secondary | ICD-10-CM | POA: Diagnosis not present

## 2016-06-10 DIAGNOSIS — R001 Bradycardia, unspecified: Secondary | ICD-10-CM | POA: Diagnosis not present

## 2016-06-10 DIAGNOSIS — Z79899 Other long term (current) drug therapy: Secondary | ICD-10-CM | POA: Diagnosis not present

## 2016-06-10 DIAGNOSIS — N183 Chronic kidney disease, stage 3 (moderate): Secondary | ICD-10-CM | POA: Diagnosis not present

## 2016-06-10 DIAGNOSIS — Z125 Encounter for screening for malignant neoplasm of prostate: Secondary | ICD-10-CM | POA: Diagnosis not present

## 2016-06-10 DIAGNOSIS — R7301 Impaired fasting glucose: Secondary | ICD-10-CM | POA: Diagnosis not present

## 2016-06-10 DIAGNOSIS — G464 Cerebellar stroke syndrome: Secondary | ICD-10-CM | POA: Diagnosis not present

## 2016-06-13 ENCOUNTER — Ambulatory Visit (INDEPENDENT_AMBULATORY_CARE_PROVIDER_SITE_OTHER): Payer: Medicare Other | Admitting: *Deleted

## 2016-06-13 DIAGNOSIS — Z5181 Encounter for therapeutic drug level monitoring: Secondary | ICD-10-CM | POA: Diagnosis not present

## 2016-06-13 DIAGNOSIS — Z952 Presence of prosthetic heart valve: Secondary | ICD-10-CM

## 2016-06-13 DIAGNOSIS — I359 Nonrheumatic aortic valve disorder, unspecified: Secondary | ICD-10-CM | POA: Diagnosis not present

## 2016-06-13 LAB — POCT INR: INR: 2.8

## 2016-06-20 ENCOUNTER — Telehealth: Payer: Self-pay | Admitting: *Deleted

## 2016-06-20 NOTE — Telephone Encounter (Signed)
Spoke with pt.  Told him Atorvastatin might make his blood a little thinner.  Reviewed chart.  Last INR 2.8  Will continue current dose of coumadin until next INR check and reevaluate then.  Pt in agreement.

## 2016-06-20 NOTE — Telephone Encounter (Signed)
Pt wanted you to be aware that he's started taking Atorvastatin 20mg , Dr. Willey Blade put him on this medication on Friday

## 2016-06-23 ENCOUNTER — Telehealth: Payer: Self-pay

## 2016-06-23 DIAGNOSIS — E782 Mixed hyperlipidemia: Secondary | ICD-10-CM

## 2016-06-23 MED ORDER — ATORVASTATIN CALCIUM 40 MG PO TABS: 40.0000 mg | ORAL_TABLET | Freq: Every day | ORAL | 3 refills | Status: DC

## 2016-06-23 NOTE — Telephone Encounter (Signed)
Wife notified, mailed lab slip, e-scribed medication

## 2016-07-11 ENCOUNTER — Ambulatory Visit (INDEPENDENT_AMBULATORY_CARE_PROVIDER_SITE_OTHER): Payer: Medicare Other | Admitting: *Deleted

## 2016-07-11 DIAGNOSIS — I359 Nonrheumatic aortic valve disorder, unspecified: Secondary | ICD-10-CM

## 2016-07-11 DIAGNOSIS — Z5181 Encounter for therapeutic drug level monitoring: Secondary | ICD-10-CM | POA: Diagnosis not present

## 2016-07-11 DIAGNOSIS — Z952 Presence of prosthetic heart valve: Secondary | ICD-10-CM

## 2016-07-11 LAB — POCT INR: INR: 4.5

## 2016-07-19 ENCOUNTER — Ambulatory Visit (INDEPENDENT_AMBULATORY_CARE_PROVIDER_SITE_OTHER): Payer: Medicare Other | Admitting: Internal Medicine

## 2016-08-01 ENCOUNTER — Ambulatory Visit (INDEPENDENT_AMBULATORY_CARE_PROVIDER_SITE_OTHER): Payer: Medicare Other | Admitting: *Deleted

## 2016-08-01 DIAGNOSIS — Z5181 Encounter for therapeutic drug level monitoring: Secondary | ICD-10-CM | POA: Diagnosis not present

## 2016-08-01 DIAGNOSIS — I359 Nonrheumatic aortic valve disorder, unspecified: Secondary | ICD-10-CM | POA: Diagnosis not present

## 2016-08-01 DIAGNOSIS — Z952 Presence of prosthetic heart valve: Secondary | ICD-10-CM | POA: Diagnosis not present

## 2016-08-01 LAB — POCT INR: INR: 2.6

## 2016-08-15 ENCOUNTER — Encounter (HOSPITAL_COMMUNITY): Payer: Medicare Other | Attending: Oncology

## 2016-08-15 DIAGNOSIS — D649 Anemia, unspecified: Secondary | ICD-10-CM

## 2016-08-15 LAB — CBC WITH DIFFERENTIAL/PLATELET
BASOS PCT: 1 %
Basophils Absolute: 0.1 10*3/uL (ref 0.0–0.1)
EOS ABS: 0.2 10*3/uL (ref 0.0–0.7)
Eosinophils Relative: 2 %
HEMATOCRIT: 42.2 % (ref 39.0–52.0)
HEMOGLOBIN: 14.1 g/dL (ref 13.0–17.0)
LYMPHS ABS: 2.3 10*3/uL (ref 0.7–4.0)
Lymphocytes Relative: 26 %
MCH: 28.5 pg (ref 26.0–34.0)
MCHC: 33.4 g/dL (ref 30.0–36.0)
MCV: 85.4 fL (ref 78.0–100.0)
Monocytes Absolute: 0.9 10*3/uL (ref 0.1–1.0)
Monocytes Relative: 10 %
NEUTROS ABS: 5.5 10*3/uL (ref 1.7–7.7)
NEUTROS PCT: 61 %
Platelets: 187 10*3/uL (ref 150–400)
RBC: 4.94 MIL/uL (ref 4.22–5.81)
RDW: 14.3 % (ref 11.5–15.5)
WBC: 8.9 10*3/uL (ref 4.0–10.5)

## 2016-08-15 LAB — FERRITIN: FERRITIN: 54 ng/mL (ref 24–336)

## 2016-08-15 LAB — IRON AND TIBC
IRON: 56 ug/dL (ref 45–182)
SATURATION RATIOS: 18 % (ref 17.9–39.5)
TIBC: 316 ug/dL (ref 250–450)
UIBC: 260 ug/dL

## 2016-08-29 ENCOUNTER — Ambulatory Visit (INDEPENDENT_AMBULATORY_CARE_PROVIDER_SITE_OTHER): Payer: Medicare Other | Admitting: *Deleted

## 2016-08-29 DIAGNOSIS — Z5181 Encounter for therapeutic drug level monitoring: Secondary | ICD-10-CM

## 2016-08-29 DIAGNOSIS — Z952 Presence of prosthetic heart valve: Secondary | ICD-10-CM

## 2016-08-29 DIAGNOSIS — I359 Nonrheumatic aortic valve disorder, unspecified: Secondary | ICD-10-CM | POA: Diagnosis not present

## 2016-08-29 LAB — POCT INR: INR: 2.6

## 2016-09-19 ENCOUNTER — Ambulatory Visit (INDEPENDENT_AMBULATORY_CARE_PROVIDER_SITE_OTHER): Payer: Medicare Other | Admitting: Otolaryngology

## 2016-09-19 DIAGNOSIS — R040 Epistaxis: Secondary | ICD-10-CM | POA: Diagnosis not present

## 2016-09-26 ENCOUNTER — Ambulatory Visit (INDEPENDENT_AMBULATORY_CARE_PROVIDER_SITE_OTHER): Payer: Medicare Other | Admitting: *Deleted

## 2016-09-26 DIAGNOSIS — Z952 Presence of prosthetic heart valve: Secondary | ICD-10-CM | POA: Diagnosis not present

## 2016-09-26 DIAGNOSIS — Z5181 Encounter for therapeutic drug level monitoring: Secondary | ICD-10-CM

## 2016-09-26 DIAGNOSIS — I359 Nonrheumatic aortic valve disorder, unspecified: Secondary | ICD-10-CM | POA: Diagnosis not present

## 2016-09-26 LAB — POCT INR: INR: 3.5

## 2016-10-17 ENCOUNTER — Ambulatory Visit (INDEPENDENT_AMBULATORY_CARE_PROVIDER_SITE_OTHER): Payer: Medicare Other | Admitting: Otolaryngology

## 2016-10-17 DIAGNOSIS — R040 Epistaxis: Secondary | ICD-10-CM | POA: Diagnosis not present

## 2016-11-07 ENCOUNTER — Ambulatory Visit (INDEPENDENT_AMBULATORY_CARE_PROVIDER_SITE_OTHER): Payer: Medicare Other | Admitting: *Deleted

## 2016-11-07 DIAGNOSIS — I359 Nonrheumatic aortic valve disorder, unspecified: Secondary | ICD-10-CM | POA: Diagnosis not present

## 2016-11-07 DIAGNOSIS — Z5181 Encounter for therapeutic drug level monitoring: Secondary | ICD-10-CM | POA: Diagnosis not present

## 2016-11-07 DIAGNOSIS — Z952 Presence of prosthetic heart valve: Secondary | ICD-10-CM

## 2016-11-07 LAB — POCT INR: INR: 2.5

## 2016-12-21 ENCOUNTER — Ambulatory Visit (INDEPENDENT_AMBULATORY_CARE_PROVIDER_SITE_OTHER): Payer: Medicare Other | Admitting: *Deleted

## 2016-12-21 DIAGNOSIS — I359 Nonrheumatic aortic valve disorder, unspecified: Secondary | ICD-10-CM

## 2016-12-21 DIAGNOSIS — Z5181 Encounter for therapeutic drug level monitoring: Secondary | ICD-10-CM

## 2016-12-21 DIAGNOSIS — Z952 Presence of prosthetic heart valve: Secondary | ICD-10-CM | POA: Diagnosis not present

## 2016-12-21 LAB — POCT INR: INR: 3

## 2017-01-18 ENCOUNTER — Encounter (HOSPITAL_COMMUNITY): Payer: Medicare Other

## 2017-01-18 ENCOUNTER — Encounter (HOSPITAL_COMMUNITY): Payer: Self-pay | Admitting: Adult Health

## 2017-01-18 ENCOUNTER — Other Ambulatory Visit: Payer: Self-pay

## 2017-01-18 ENCOUNTER — Encounter (HOSPITAL_COMMUNITY): Payer: Medicare Other | Attending: Oncology | Admitting: Adult Health

## 2017-01-18 ENCOUNTER — Other Ambulatory Visit (HOSPITAL_COMMUNITY): Payer: Medicare Other

## 2017-01-18 VITALS — BP 130/73 | HR 72 | Temp 97.8°F | Resp 18 | Ht 68.0 in | Wt 201.0 lb

## 2017-01-18 DIAGNOSIS — D696 Thrombocytopenia, unspecified: Secondary | ICD-10-CM | POA: Insufficient documentation

## 2017-01-18 DIAGNOSIS — D72829 Elevated white blood cell count, unspecified: Secondary | ICD-10-CM | POA: Diagnosis not present

## 2017-01-18 DIAGNOSIS — D649 Anemia, unspecified: Secondary | ICD-10-CM | POA: Diagnosis not present

## 2017-01-18 LAB — IRON AND TIBC
Iron: 69 ug/dL (ref 45–182)
SATURATION RATIOS: 21 % (ref 17.9–39.5)
TIBC: 329 ug/dL (ref 250–450)
UIBC: 260 ug/dL

## 2017-01-18 LAB — CBC WITH DIFFERENTIAL/PLATELET
Basophils Absolute: 0.1 10*3/uL (ref 0.0–0.1)
Basophils Relative: 1 %
EOS ABS: 0.1 10*3/uL (ref 0.0–0.7)
Eosinophils Relative: 1 %
HEMATOCRIT: 42.1 % (ref 39.0–52.0)
HEMOGLOBIN: 13.6 g/dL (ref 13.0–17.0)
Lymphocytes Relative: 17 %
Lymphs Abs: 1.7 10*3/uL (ref 0.7–4.0)
MCH: 28.1 pg (ref 26.0–34.0)
MCHC: 32.3 g/dL (ref 30.0–36.0)
MCV: 87 fL (ref 78.0–100.0)
MONOS PCT: 8 %
Monocytes Absolute: 0.7 10*3/uL (ref 0.1–1.0)
NEUTROS ABS: 7.3 10*3/uL (ref 1.7–7.7)
NEUTROS PCT: 73 %
Platelets: 213 10*3/uL (ref 150–400)
RBC: 4.84 MIL/uL (ref 4.22–5.81)
RDW: 14.3 % (ref 11.5–15.5)
WBC: 9.8 10*3/uL (ref 4.0–10.5)

## 2017-01-18 LAB — BASIC METABOLIC PANEL
Anion gap: 7 (ref 5–15)
BUN: 27 mg/dL — AB (ref 6–20)
CHLORIDE: 103 mmol/L (ref 101–111)
CO2: 25 mmol/L (ref 22–32)
CREATININE: 1.76 mg/dL — AB (ref 0.61–1.24)
Calcium: 9.2 mg/dL (ref 8.9–10.3)
GFR calc Af Amer: 43 mL/min — ABNORMAL LOW (ref >60)
GFR calc non Af Amer: 37 mL/min — ABNORMAL LOW (ref >60)
Glucose, Bld: 118 mg/dL — ABNORMAL HIGH (ref 65–99)
POTASSIUM: 3.6 mmol/L (ref 3.5–5.1)
Sodium: 135 mmol/L (ref 135–145)

## 2017-01-18 LAB — VITAMIN B12: Vitamin B-12: 296 pg/mL (ref 180–914)

## 2017-01-18 LAB — FOLATE: Folate: 13 ng/mL (ref >5.9)

## 2017-01-18 LAB — FERRITIN: Ferritin: 59 ng/mL (ref 24–336)

## 2017-01-18 NOTE — Progress Notes (Signed)
Stephen Moses, Stephen Moses   CLINIC:  Medical Oncology/Hematology  PCP:  [name], MD [address] [place] [address] [phone]   REASON FOR VISIT:  Follow-up for Leukocytosis AND anemia   CURRENT THERAPY: Observation     HISTORY OF PRESENT ILLNESS:  (From Kirby Crigler, PA-C's last note on 04/18/16)       INTERVAL HISTORY:  Stephen Moses 70 y.o. male returns for routine follow-up for leukocytosis and anemia.   Here today with his wife.   Overall, he tells me he has been feeling great. Appetite and energy levels both 100%. He has intermittent arthralgias, for which he takes Tylenol PRN. Remains on Coumadin for h/o aortic valve replacement. He remains active by walking ~2 miles per day.  Admittedly, he does not drink much fluid during the day.    Denies any fever/chills or night sweats.  Denies any frank bleeding episodes including blood in his stool, dark/tarry stools, hematuria, nosebleeds, or gingival bleeding.  He stopped his oral iron as directed at his last visit.  He did have a hospitalization in 05/2016 for rectal bleeding; he was found to have supratherapeutic INR >10 at that time.  CT abdomen revealed possible diverticulitis versus colitis; he was treated with antibiotics and his leukocytosis resolved.  He reportedly never had fever.  His GI bleeding resolved as well.  He was able to advance his diet during hospitalization without difficulty.  Coumadin was resumed with Lovenox bridge.  He has had no subsequent GI bleeding since that time.   Anticoagulation is managed by cardiology; he sees them regularly.  Otherwise he is largely without complaints today.     REVIEW OF SYSTEMS:  Review of Systems  Constitutional: Negative.  Negative for chills, fatigue and fever.  HENT:  Negative.  Negative for lump/mass and nosebleeds.   Eyes: Negative.   Respiratory: Negative.  Negative for cough and shortness of breath.     Cardiovascular: Negative.  Negative for chest pain and leg swelling.  Gastrointestinal: Negative.  Negative for abdominal pain, blood in stool, constipation, diarrhea, nausea and vomiting.  Endocrine: Negative.   Genitourinary: Negative.  Negative for dysuria and hematuria.   Musculoskeletal: Positive for arthralgias.  Skin: Negative.  Negative for rash.  Neurological: Negative.  Negative for dizziness and headaches.  Hematological: Negative.  Negative for adenopathy. Does not bruise/bleed easily.  Psychiatric/Behavioral: Negative.  Negative for depression and sleep disturbance. The patient is not nervous/anxious.      PAST MEDICAL/SURGICAL HISTORY:  Past Medical History:  Diagnosis Date  . AORTIC VALVE REPLACEMENT, HX OF 07/06/2009   Qualifier: Diagnosis of  By: Purcell Nails, NP, Curt Bears    . Ascending aortic aneurysm ([place]) 09/16/2011  . Calculus of kidney   . Dizziness   . Leukocytosis 05/17/2015  . Long term current use of anticoagulant 05/30/2010  . Normocytic anemia 05/18/2015  . Pancytopenia ([place]) 05/18/2015  . Paraspinal muscle spasm   . Thrombocytopenia ([place]) 05/18/2015  . Unspecified essential hypertension    Past Surgical History:  Procedure Laterality Date  . back surgery  2009  . CARDIAC VALVE REPLACEMENT    . COLONOSCOPY    . COLONOSCOPY N/A 10/16/2014   Dr. Laural Golden: two colon polyps (tubular adenomas), scattered diverticula at sigmoid, small hemorrhoids above and below dentate line, surveillance due in 2021.   [name] EYE SURGERY    . Left rotator cuff repair    . SHOULDER SURGERY Bilateral  SOCIAL HISTORY:  Social History   Socioeconomic History  . Marital status: Married    Spouse name: Not on file  . Number of children: Not on file  . Years of education: Not on file  . Highest education level: Not on file  Social Needs  . Financial resource strain: Not on file  . Food insecurity - worry: Not on file  . Food insecurity - inability: Not on file  . Transportation  needs - medical: Not on file  . Transportation needs - non-medical: Not on file  Occupational History  . Not on file  Tobacco Use  . Smoking status: Former Smoker    Packs/day: 2.50    Years: 35.00    Pack years: 87.50    Types: Cigarettes    Start date: 11/22/1958    Last attempt to quit: 11/21/1993    Years since quitting: 23.1  . Smokeless tobacco: Never Used  Substance and Sexual Activity  . Alcohol use: Yes    Alcohol/week: 0.0 oz    Comment: occasional beer  . Drug use: No  . Sexual activity: Not on file  Other Topics Concern  . Not on file  Social History Narrative  . Not on file    FAMILY HISTORY:  Family History  Problem Relation Age of Onset  . Leukemia Brother   . Heart attack Brother   . Lung cancer Brother   . Colon cancer Brother        less than age 68.   [name] Leukemia Sister     CURRENT MEDICATIONS:  Outpatient Encounter Medications as of 01/18/2017  Medication Sig  . acetaminophen (TYLENOL) 500 MG tablet Take 500 mg by mouth every 6 (six) hours as needed for mild pain or moderate pain.  [name] amLODipine (NORVASC) 5 MG tablet Take 5 mg by mouth at bedtime.   [name] atorvastatin (LIPITOR) 40 MG tablet   . metoprolol succinate (TOPROL-XL) 25 MG 24 hr tablet Take 25 mg by mouth at bedtime.   [name] warfarin (COUMADIN) 10 MG tablet Take 1/2 tablet daily or as directed (Patient taking differently: Take 5 mg by mouth daily. )  . [DISCONTINUED] atorvastatin (LIPITOR) 40 MG tablet Take 1 tablet (40 mg total) by mouth daily.  . [DISCONTINUED] enoxaparin (LOVENOX) 120 MG/0.8ML injection Inject 0.8 mLs (120 mg total) into the skin daily.   No facility-administered encounter medications on file as of 01/18/2017.     ALLERGIES:  Allergies  Allergen Reactions  . Codeine Nausea And Vomiting and Palpitations     PHYSICAL EXAM:  ECOG Performance status: 0 - Asymptomatic   Vitals:   01/18/17 1350  BP: 130/73  Pulse: 72  Resp: 18  Temp: 97.8 F (36.6 C)  SpO2: 99%    Filed Weights   01/18/17 1350  Weight: 201 lb (91.2 kg)    Physical Exam  Constitutional: He is oriented to person, place, and time and well-developed, well-nourished, and in no distress.  HENT:  Head: Normocephalic.  Mouth/Throat: Oropharynx is clear and moist. No oropharyngeal exudate.  Eyes: Conjunctivae are normal. Pupils are equal, round, and reactive to light. No scleral icterus.  Neck: Normal range of motion. Neck supple.  Cardiovascular: Normal rate and regular rhythm.  Systolic click (s/p aortic valve replacement)  Pulmonary/Chest: Effort normal and breath sounds normal. No respiratory distress.  Abdominal: Soft. Bowel sounds are normal. There is no tenderness.  Musculoskeletal: Normal range of motion. He exhibits no edema.  Lymphadenopathy:    He has no  cervical adenopathy.       Right: No supraclavicular adenopathy present.       Left: No supraclavicular adenopathy present.  Neurological: He is alert and oriented to person, place, and time. No cranial nerve deficit. Gait normal.  Skin: Skin is warm and dry. No rash noted.  Psychiatric: Mood, memory, affect and judgment normal.  Nursing note and vitals reviewed.    LABORATORY DATA:  I have reviewed the labs as listed.  CBC    Component Value Date/Time   WBC 9.8 01/18/2017 1309   RBC 4.84 01/18/2017 1309   HGB 13.6 01/18/2017 1309   HCT 42.1 01/18/2017 1309   PLT 213 01/18/2017 1309   MCV 87.0 01/18/2017 1309   MCH 28.1 01/18/2017 1309   MCHC 32.3 01/18/2017 1309   RDW 14.3 01/18/2017 1309   LYMPHSABS 1.7 01/18/2017 1309   MONOABS 0.7 01/18/2017 1309   EOSABS 0.1 01/18/2017 1309   BASOSABS 0.1 01/18/2017 1309   CMP Latest Ref Rng & Units 01/18/2017 05/30/2016 05/29/2016  Glucose 65 - 99 mg/dL 118(H) 123(H) 98  BUN 6 - 20 mg/dL 27(H) 19 26(H)  Creatinine 0.61 - 1.24 mg/dL 1.76(H) 1.53(H) 1.70(H)  Sodium 135 - 145 mmol/L 135 138 141  Potassium 3.5 - 5.1 mmol/L 3.6 3.7 3.8  Chloride 101 - 111 mmol/L 103 107  108  CO2 22 - 32 mmol/L 25 22 -  Calcium 8.9 - 10.3 mg/dL 9.2 8.9 -  Total Protein 6.5 - 8.1 g/dL - 7.0 -  Total Bilirubin 0.3 - 1.2 mg/dL - 0.4 -  Alkaline Phos 38 - 126 U/L - 49 -  AST 15 - 41 U/L - 25 -  ALT 17 - 63 U/L - 19 -    PENDING LABS:    DIAGNOSTIC IMAGING:    PATHOLOGY:     ASSESSMENT & PLAN:   Leukocytosis:  -Negative peripheral blood work-up. He has not had bone marrow biopsy/aspiration.   -WBCs were noted to be in normal range in 03/2016, 05/2016, and 07/2016.  There was one period of mild leukocytosis in 05/2016 in 13 range, then normalized thereafter. He was hospitalized for rectal bleeding during that time of elevated WBCs.  -WBCs normal today at 9.8. Discussed with Dr. Talbert Cage. Given the stability of his WBCs over the past several months, we feel comfortable releasing him from follow-up at the cancer center here today. Stephen Moses is enthusiastic about this plan.  Of course, should he have any concerns in the future, we would be happy to see him as needed.   Anemia:  -Hgb has been normal and stable for the past several months in the 13-14 g/dL range. Only 1 reported hgb was mildly low at 12.4 on 05/31/16, again during hospitalization for rectal bleeding.  -Hgb today normal at 13.6 g/dL. Clinically, he feels great and has had no recurrent GI bleeding or other active bleeding episodes since his hospitalization several months ago.  Again discussed with Dr. Talbert Cage. Since his labs are normal and have remained normal for several months, we feel comfortable releasing him from additional follow-up at the cancer center.  He agrees with this plan.  Encouraged him to call us or his PCP if he has any additional questions/concerns and we would be happy to see him again in the future as needed.   Addendum:      *Anemia panel resulted after patient left clinic. Iron studies, vitamin B12, and folate remain normal.  Dispo:  -No additional follow-up at cancer center is  warranted at this time. Of course, if he has any new or worsening concerns in the future, we would be happy to see him again as needed. No return appointments to be made today. I wished him well.    All questions were answered to patient's stated satisfaction. Encouraged patient to call with any new concerns or questions.  Plan of care discussed with Dr. Talbert Cage, who agrees with the above aforementioned.    Orders placed this encounter:  No orders of the defined types were placed in this encounter.     Mike Craze, NP [place] [phone]

## 2017-01-20 ENCOUNTER — Encounter (HOSPITAL_COMMUNITY): Payer: Self-pay | Admitting: Adult Health

## 2017-02-01 ENCOUNTER — Ambulatory Visit (INDEPENDENT_AMBULATORY_CARE_PROVIDER_SITE_OTHER): Payer: Medicare Other | Admitting: *Deleted

## 2017-02-01 DIAGNOSIS — Z5181 Encounter for therapeutic drug level monitoring: Secondary | ICD-10-CM

## 2017-02-01 DIAGNOSIS — I359 Nonrheumatic aortic valve disorder, unspecified: Secondary | ICD-10-CM

## 2017-02-01 DIAGNOSIS — Z952 Presence of prosthetic heart valve: Secondary | ICD-10-CM

## 2017-02-01 LAB — POCT INR: INR: 2.8

## 2017-02-10 ENCOUNTER — Emergency Department (HOSPITAL_COMMUNITY)
Admission: EM | Admit: 2017-02-10 | Discharge: 2017-02-10 | Disposition: A | Discharge status: Home / Self Care | Payer: Medicare Other | Attending: Emergency Medicine | Admitting: Emergency Medicine

## 2017-02-10 ENCOUNTER — Other Ambulatory Visit: Payer: Self-pay

## 2017-02-10 ENCOUNTER — Encounter (HOSPITAL_COMMUNITY): Payer: Self-pay

## 2017-02-10 DIAGNOSIS — Z48817 Encounter for surgical aftercare following surgery on the skin and subcutaneous tissue: Secondary | ICD-10-CM | POA: Diagnosis not present

## 2017-02-10 DIAGNOSIS — Z7901 Long term (current) use of anticoagulants: Secondary | ICD-10-CM | POA: Insufficient documentation

## 2017-02-10 DIAGNOSIS — G8918 Other acute postprocedural pain: Secondary | ICD-10-CM | POA: Diagnosis not present

## 2017-02-10 DIAGNOSIS — Z79899 Other long term (current) drug therapy: Secondary | ICD-10-CM | POA: Insufficient documentation

## 2017-02-10 DIAGNOSIS — Z87891 Personal history of nicotine dependence: Secondary | ICD-10-CM | POA: Insufficient documentation

## 2017-02-10 DIAGNOSIS — I10 Essential (primary) hypertension: Secondary | ICD-10-CM | POA: Diagnosis not present

## 2017-02-10 MED ORDER — OXYCODONE-ACETAMINOPHEN 5-325 MG PO TABS: 1.0000 | ORAL_TABLET | Freq: Four times a day (QID) | ORAL | 0 refills | Status: DC | PRN

## 2017-02-10 MED ORDER — LIDOCAINE-EPINEPHRINE-TETRACAINE (LET) SOLUTION
3.0000 mL | Freq: Once | NASAL | Status: AC
  Administered 2017-02-10: 3 mL via TOPICAL
  Filled 2017-02-10: qty 3

## 2017-02-10 MED ORDER — OXYCODONE-ACETAMINOPHEN 5-325 MG PO TABS: 2.0000 | ORAL_TABLET | ORAL | 0 refills | Status: DC | PRN

## 2017-02-10 MED ORDER — OXYCODONE-ACETAMINOPHEN 5-325 MG PO TABS
1.0000 | ORAL_TABLET | Freq: Once | ORAL | Status: AC
  Administered 2017-02-10: 1 via ORAL
  Filled 2017-02-10: qty 1

## 2017-02-10 NOTE — ED Triage Notes (Signed)
Pt had an area of skin cancer taken off of his left forehead Thursday approx 8 am.  Pt states as soon as the numbing meds wore off the pain has been increasing, taking tylenol only due to coumadin use

## 2017-02-10 NOTE — ED Notes (Signed)
Patient was given a prepackage of Percocet, quantity six and given instructions on use, patient verbally understands.

## 2017-02-10 NOTE — ED Provider Notes (Signed)
[facility] EMERGENCY DEPARTMENT Provider Note   CSN: [id number] Arrival date & time: 02/10/17  0033     History   Chief Complaint Chief Complaint  Patient presents with  . post surgical pain    skin cancer removed from forehead    HPI Stephen Moses is a 71 y.o. male.  Patient presents to the emergency department for evaluation of postsurgical pain.  Patient had what appears to be Mohs surgery on the left side of his forehead yesterday.  He reports that he was not given any pain medication to use, has been taking Tylenol but it has not helped the pain.  The pain is progressively worsened, now 10 out of 10.      Past Medical History:  Diagnosis Date  . AORTIC VALVE REPLACEMENT, HX OF 07/06/2009   Qualifier: Diagnosis of  By: Purcell Nails, NP, Curt Bears    . Ascending aortic aneurysm ([place]) 09/16/2011  . Calculus of kidney   . Dizziness   . Leukocytosis 05/17/2015  . Long term current use of anticoagulant 05/30/2010  . Normocytic anemia 05/18/2015  . Pancytopenia ([place]) 05/18/2015  . Paraspinal muscle spasm   . Thrombocytopenia ([place]) 05/18/2015  . Unspecified essential hypertension     Patient Active Problem List   Diagnosis Date Noted  . Rectal bleeding 05/29/2016  . Diverticulitis 05/29/2016  . Normocytic anemia 05/18/2015  . Thrombocytopenia ([place]) 05/18/2015  . Leukocytosis 05/17/2015  . CAP (community acquired pneumonia) 03/30/2014  . Acute bronchitis 03/30/2014  . Essential hypertension 03/30/2014  . Acute kidney injury ([place]) 03/30/2014  . Lung density on x-ray 03/30/2014  . Encounter for therapeutic drug monitoring 03/28/2013  . Ascending aortic aneurysm ([place]) 09/16/2011  . Aortic valve disorder 05/30/2010  . Long term current use of anticoagulant 05/30/2010  . SYNCOPE 07/06/2009  . DIZZINESS 07/06/2009  . PALPITATIONS 07/06/2009  . H/O aortic valve replacement 07/06/2009  . HYPERTENSION 08/08/2008  . RENAL CALCULUS 08/08/2008    Past Surgical History:    Procedure Laterality Date  . back surgery  2009  . CARDIAC VALVE REPLACEMENT    . COLONOSCOPY    . COLONOSCOPY N/A 10/16/2014   Dr. Laural Golden: two colon polyps (tubular adenomas), scattered diverticula at sigmoid, small hemorrhoids above and below dentate line, surveillance due in 2021.   [name] EYE SURGERY    . Left rotator cuff repair    . SHOULDER SURGERY Bilateral        Home Medications    Prior to Admission medications   Medication Sig Start Date End Date Taking? Authorizing Provider  acetaminophen (TYLENOL) 500 MG tablet Take 500 mg by mouth every 6 (six) hours as needed for mild pain or moderate pain.    [provider]  amLODipine (NORVASC) 5 MG tablet Take 5 mg by mouth at bedtime.     [provider]  atorvastatin (LIPITOR) 40 MG tablet  01/07/17   [provider]  metoprolol succinate (TOPROL-XL) 25 MG 24 hr tablet Take 25 mg by mouth at bedtime.  06/06/12   Fay Records, MD  oxyCODONE-acetaminophen (PERCOCET) 5-325 MG tablet Take 2 tablets by mouth every 4 (four) hours as needed. 02/10/17   Orpah Greek, MD  oxyCODONE-acetaminophen (PERCOCET) 5-325 MG tablet Take 1 tablet by mouth every 6 (six) hours as needed. 02/10/17   Orpah Greek, MD  warfarin (COUMADIN) 10 MG tablet Take 1/2 tablet daily or as directed Patient taking differently: Take 5 mg by mouth daily.  03/19/14   Bronson Ing,  Lenise Herald, MD    Family History Family History  Problem Relation Age of Onset  . Leukemia Brother   . Heart attack Brother   . Lung cancer Brother   . Colon cancer Brother        less than age 30.   [name] Leukemia Sister     Social History Social History   Tobacco Use  . Smoking status: Former Smoker    Packs/day: 2.50    Years: 35.00    Pack years: 87.50    Types: Cigarettes    Start date: 11/22/1958    Last attempt to quit: 11/21/1993    Years since quitting: 23.2  . Smokeless tobacco: Never Used  Substance Use Topics  . Alcohol use: Yes     Alcohol/week: 0.0 oz    Comment: occasional beer  . Drug use: No     Allergies   Codeine   Review of Systems Review of Systems  Constitutional: Negative for fever.  Skin: Positive for wound.  All other systems reviewed and are negative.    Physical Exam Updated Vital Signs BP (!) 143/88 (BP Location: Right Arm)   Pulse 74   Temp 98.1 F (36.7 C) (Oral)   Resp 16   Ht 5\' 8"  (1.727 m)   Wt 90.7 kg (200 lb)   SpO2 98%   BMI 30.41 kg/m   Physical Exam  Constitutional: He is oriented to person, place, and time. He appears well-developed and well-nourished. No distress.  HENT:  Head: Normocephalic.  Right Ear: Hearing normal.  Left Ear: Hearing normal.  Nose: Nose normal.  Mouth/Throat: Oropharynx is clear and moist and mucous membranes are normal.  Horizontally oriented surgical incision left forehead with sutures intact.  Some edema around the wound with mild ecchymosis.  No bleeding, no erythema or drainage to suggest infection  Eyes: Conjunctivae and EOM are normal. Pupils are equal, round, and reactive to light.  Neck: Normal range of motion. Neck supple.  Cardiovascular: Regular rhythm, S1 normal and S2 normal. Exam reveals no gallop and no friction rub.  No murmur heard. Pulmonary/Chest: Effort normal and breath sounds normal. No respiratory distress. He exhibits no tenderness.  Abdominal: Soft. Normal appearance and bowel sounds are normal. There is no hepatosplenomegaly. There is no tenderness. There is no rebound, no guarding, no tenderness at McBurney's point and negative Murphy's sign. No hernia.  Musculoskeletal: Normal range of motion.  Neurological: He is alert and oriented to person, place, and time. He has normal strength. No cranial nerve deficit or sensory deficit. Coordination normal. GCS eye subscore is 4. GCS verbal subscore is 5. GCS motor subscore is 6.  Skin: Skin is warm, dry and intact. No rash noted. No cyanosis.  Psychiatric: He has a normal  mood and affect. His speech is normal and behavior is normal. Thought content normal.  Nursing note and vitals reviewed.    ED Treatments / Results  Labs (all labs ordered are listed, but only abnormal results are displayed) Labs Reviewed - No data to display  EKG  EKG Interpretation None       Radiology No results found.  Procedures Procedures (including critical care time)  Medications Ordered in ED Medications  oxyCODONE-acetaminophen (PERCOCET/ROXICET) 5-325 MG per tablet 1 tablet (1 tablet Oral Given 02/10/17 0123)  lidocaine-EPINEPHrine-tetracaine (LET) solution (3 mLs Topical Given 02/10/17 0123)     Initial Impression / Assessment and Plan / ED Course  I have reviewed the triage vital signs and the nursing  notes.  Pertinent labs & imaging results that were available during my care of the patient were reviewed by me and considered in my medical decision making (see chart for details).     Patient presents with postoperative pain.  He had a skin cancer removed from his left forehead yesterday.  He takes Coumadin, has some evidence of slight bleeding under the surface of the wound with stretching of the sutures, however, wound is intact.  No evidence of infection at this time.  He also had a pressure dressing over the area that was very tight and was causing some increased pain.  This was removed and the patient was provided analgesia.  Final Clinical Impressions(s) / ED Diagnoses   Final diagnoses:  Post-operative pain    ED Discharge Orders        Ordered    oxyCODONE-acetaminophen (PERCOCET) 5-325 MG tablet  Every 4 hours PRN     02/10/17 0204    oxyCODONE-acetaminophen (PERCOCET) 5-325 MG tablet  Every 6 hours PRN     02/10/17 0204       Orpah Greek, MD 02/10/17 [phone]

## 2017-02-16 MED FILL — Oxycodone w/ Acetaminophen Tab 5-325 MG: ORAL | Qty: 6 | Status: AC

## 2017-02-17 ENCOUNTER — Ambulatory Visit (INDEPENDENT_AMBULATORY_CARE_PROVIDER_SITE_OTHER): Payer: Medicare Other | Admitting: *Deleted

## 2017-02-17 DIAGNOSIS — I359 Nonrheumatic aortic valve disorder, unspecified: Secondary | ICD-10-CM

## 2017-02-17 DIAGNOSIS — Z5181 Encounter for therapeutic drug level monitoring: Secondary | ICD-10-CM

## 2017-02-17 DIAGNOSIS — Z952 Presence of prosthetic heart valve: Secondary | ICD-10-CM | POA: Diagnosis not present

## 2017-02-17 LAB — POCT INR: INR: 3.1

## 2017-02-17 NOTE — Patient Instructions (Signed)
Continue coumadin 1/2 tablet everyday.  Recheck in 6 weeks Continue coumadin greens

## 2017-03-29 ENCOUNTER — Ambulatory Visit (INDEPENDENT_AMBULATORY_CARE_PROVIDER_SITE_OTHER): Payer: Medicare Other | Admitting: *Deleted

## 2017-03-29 DIAGNOSIS — Z5181 Encounter for therapeutic drug level monitoring: Secondary | ICD-10-CM | POA: Diagnosis not present

## 2017-03-29 DIAGNOSIS — Z952 Presence of prosthetic heart valve: Secondary | ICD-10-CM | POA: Diagnosis not present

## 2017-03-29 DIAGNOSIS — I359 Nonrheumatic aortic valve disorder, unspecified: Secondary | ICD-10-CM | POA: Diagnosis not present

## 2017-03-29 LAB — POCT INR: INR: 3.5

## 2017-03-29 NOTE — Patient Instructions (Signed)
Continue coumadin 1/2 tablet everyday.  Recheck in 6 weeks Continue coumadin greens

## 2017-04-17 ENCOUNTER — Telehealth: Payer: Self-pay | Admitting: Cardiovascular Disease

## 2017-04-17 NOTE — Telephone Encounter (Signed)
Patient started new medication and was instructed to contact Lattie Haw about coumadin

## 2017-04-17 NOTE — Telephone Encounter (Signed)
Spoke with pt's wife.  Pt was started on Augmentin bid x 7 days on 04/14/17 by Dr Willey Blade for bronchitis.  Pt was told to call since it elevates INR level. Told pt to decrease coumadin to 5mg  daily except none tonight and Thursday night then resume 5mg  daily on Friday.  Appt made for INR check on 04/24/17.  Wife/pt verbalized understanding.

## 2017-04-24 ENCOUNTER — Ambulatory Visit (INDEPENDENT_AMBULATORY_CARE_PROVIDER_SITE_OTHER): Payer: Medicare Other | Admitting: *Deleted

## 2017-04-24 DIAGNOSIS — I359 Nonrheumatic aortic valve disorder, unspecified: Secondary | ICD-10-CM

## 2017-04-24 DIAGNOSIS — Z952 Presence of prosthetic heart valve: Secondary | ICD-10-CM | POA: Diagnosis not present

## 2017-04-24 DIAGNOSIS — Z5181 Encounter for therapeutic drug level monitoring: Secondary | ICD-10-CM | POA: Diagnosis not present

## 2017-04-24 LAB — POCT INR: INR: 2.1

## 2017-04-24 NOTE — Patient Instructions (Signed)
Take coumadin 1 tablet tonight then resume 1/2 tablet everyday.  Recheck in 4 weeks Continue coumadin greens

## 2017-05-24 ENCOUNTER — Ambulatory Visit (INDEPENDENT_AMBULATORY_CARE_PROVIDER_SITE_OTHER): Payer: Medicare Other | Admitting: *Deleted

## 2017-05-24 DIAGNOSIS — Z5181 Encounter for therapeutic drug level monitoring: Secondary | ICD-10-CM | POA: Diagnosis not present

## 2017-05-24 DIAGNOSIS — Z952 Presence of prosthetic heart valve: Secondary | ICD-10-CM | POA: Diagnosis not present

## 2017-05-24 DIAGNOSIS — I359 Nonrheumatic aortic valve disorder, unspecified: Secondary | ICD-10-CM

## 2017-05-24 LAB — POCT INR: INR: 3.4

## 2017-05-24 NOTE — Patient Instructions (Signed)
Continue coumadin 1/2 tablet daily Recheck in 4 weeks Continue coumadin greens

## 2017-06-21 ENCOUNTER — Ambulatory Visit (INDEPENDENT_AMBULATORY_CARE_PROVIDER_SITE_OTHER): Payer: Medicare Other | Admitting: *Deleted

## 2017-06-21 DIAGNOSIS — I359 Nonrheumatic aortic valve disorder, unspecified: Secondary | ICD-10-CM

## 2017-06-21 DIAGNOSIS — Z952 Presence of prosthetic heart valve: Secondary | ICD-10-CM

## 2017-06-21 DIAGNOSIS — Z5181 Encounter for therapeutic drug level monitoring: Secondary | ICD-10-CM

## 2017-06-21 LAB — POCT INR: INR: 3

## 2017-06-21 NOTE — Patient Instructions (Signed)
Continue coumadin 1/2 tablet daily Recheck in 4 weeks Continue coumadin greens

## 2017-07-19 ENCOUNTER — Ambulatory Visit (INDEPENDENT_AMBULATORY_CARE_PROVIDER_SITE_OTHER): Payer: Medicare Other | Admitting: *Deleted

## 2017-07-19 DIAGNOSIS — Z5181 Encounter for therapeutic drug level monitoring: Secondary | ICD-10-CM

## 2017-07-19 DIAGNOSIS — I359 Nonrheumatic aortic valve disorder, unspecified: Secondary | ICD-10-CM

## 2017-07-19 DIAGNOSIS — Z952 Presence of prosthetic heart valve: Secondary | ICD-10-CM | POA: Diagnosis not present

## 2017-07-19 LAB — POCT INR: INR: 2.8 (ref 2.0–3.0)

## 2017-07-19 NOTE — Patient Instructions (Signed)
Continue coumadin 1/2 tablet daily Recheck in 6 weeks Continue coumadin greens 

## 2017-07-20 ENCOUNTER — Other Ambulatory Visit: Payer: Self-pay | Admitting: Cardiovascular Disease

## 2017-08-30 ENCOUNTER — Ambulatory Visit (INDEPENDENT_AMBULATORY_CARE_PROVIDER_SITE_OTHER): Payer: Medicare Other | Admitting: *Deleted

## 2017-08-30 DIAGNOSIS — Z952 Presence of prosthetic heart valve: Secondary | ICD-10-CM

## 2017-08-30 DIAGNOSIS — I359 Nonrheumatic aortic valve disorder, unspecified: Secondary | ICD-10-CM

## 2017-08-30 DIAGNOSIS — Z5181 Encounter for therapeutic drug level monitoring: Secondary | ICD-10-CM | POA: Diagnosis not present

## 2017-08-30 LAB — POCT INR: INR: 3.5 — AB (ref 2.0–3.0)

## 2017-08-30 NOTE — Patient Instructions (Signed)
Continue coumadin 1/2 tablet daily Recheck in 6 weeks Continue coumadin greens

## 2017-10-11 ENCOUNTER — Ambulatory Visit (INDEPENDENT_AMBULATORY_CARE_PROVIDER_SITE_OTHER): Payer: Medicare Other | Admitting: *Deleted

## 2017-10-11 DIAGNOSIS — I359 Nonrheumatic aortic valve disorder, unspecified: Secondary | ICD-10-CM | POA: Diagnosis not present

## 2017-10-11 DIAGNOSIS — Z952 Presence of prosthetic heart valve: Secondary | ICD-10-CM | POA: Diagnosis not present

## 2017-10-11 DIAGNOSIS — Z5181 Encounter for therapeutic drug level monitoring: Secondary | ICD-10-CM | POA: Diagnosis not present

## 2017-10-11 LAB — POCT INR: INR: 4.2 — AB (ref 2.0–3.0)

## 2017-10-11 NOTE — Patient Instructions (Signed)
Description   Skip today's dose, then Continue coumadin 1/2 tablet daily Recheck in 3 weeks Continue coumadin greens

## 2017-11-01 ENCOUNTER — Ambulatory Visit (INDEPENDENT_AMBULATORY_CARE_PROVIDER_SITE_OTHER): Payer: Medicare Other | Admitting: *Deleted

## 2017-11-01 DIAGNOSIS — Z952 Presence of prosthetic heart valve: Secondary | ICD-10-CM | POA: Diagnosis not present

## 2017-11-01 DIAGNOSIS — I359 Nonrheumatic aortic valve disorder, unspecified: Secondary | ICD-10-CM | POA: Diagnosis not present

## 2017-11-01 DIAGNOSIS — Z5181 Encounter for therapeutic drug level monitoring: Secondary | ICD-10-CM | POA: Diagnosis not present

## 2017-11-01 LAB — POCT INR: INR: 2.9 (ref 2.0–3.0)

## 2017-11-01 NOTE — Patient Instructions (Signed)
Continue coumadin 1/2 tablet daily Recheck in 4 weeks Continue greens

## 2017-11-29 ENCOUNTER — Ambulatory Visit (INDEPENDENT_AMBULATORY_CARE_PROVIDER_SITE_OTHER): Payer: Medicare Other | Admitting: *Deleted

## 2017-11-29 DIAGNOSIS — I359 Nonrheumatic aortic valve disorder, unspecified: Secondary | ICD-10-CM

## 2017-11-29 DIAGNOSIS — Z952 Presence of prosthetic heart valve: Secondary | ICD-10-CM

## 2017-11-29 DIAGNOSIS — Z5181 Encounter for therapeutic drug level monitoring: Secondary | ICD-10-CM | POA: Diagnosis not present

## 2017-11-29 LAB — POCT INR: INR: 3.4 — AB (ref 2.0–3.0)

## 2017-11-29 NOTE — Patient Instructions (Signed)
Continue coumadin 1/2 tablet daily Recheck in 4 weeks Continue greens

## 2017-12-14 ENCOUNTER — Encounter: Payer: Self-pay | Admitting: Cardiology

## 2017-12-14 NOTE — Progress Notes (Deleted)
Cardiology Office Note  Date: 12/14/2017   ID: Stephen, Moses 20-Feb-1946, MRN [id number]  PCP: [name], MD  Primary Cardiologist: [name], MD  No chief complaint on file.   History of Present Illness: Stephen Moses is a 72 y.o. male patient of [name], inadvertently placed on my schedule today overdue for a routine follow-up visit.  I reviewed his records and updated the chart.  He last saw [name] in May 2017.  He continues on Coumadin with follow-up in the anticoagulation clinic.  He has a history of ascending aortic aneurysm with 23-mm St. Jude mechanical valve conduit and re-implantation of the coronary arteries on July 25, 2008.  He has not had a follow-up echocardiogram in several years.  Past Medical History:  Diagnosis Date  . Ascending aortic aneurysm ([place] [facility]) 2010    23-mm St. Jude mechanical valve conduit and re-implantation of the coronary arteries  . Bicuspid aortic valve   . Calculus of kidney   . Essential hypertension   . Long term current use of anticoagulant   . Normocytic anemia   . Pancytopenia ([place])   . Paraspinal muscle spasm     Past Surgical History:  Procedure Laterality Date  . BACK SURGERY  2009  . CARDIAC VALVE REPLACEMENT  2010  . COLONOSCOPY    . COLONOSCOPY N/A 10/16/2014   Dr. Laural Golden: two colon polyps (tubular adenomas), scattered diverticula at sigmoid, small hemorrhoids above and below dentate line, surveillance due in 2021.   [name] EYE SURGERY    . Left rotator cuff repair    . SHOULDER SURGERY Bilateral     Current Outpatient Medications  Medication Sig Dispense Refill  . acetaminophen (TYLENOL) 500 MG tablet Take 500 mg by mouth every 6 (six) hours as needed for mild pain or moderate pain.    [name] amLODipine (NORVASC) 5 MG tablet Take 5 mg by mouth at bedtime.     [name] atorvastatin (LIPITOR) 40 MG tablet     . atorvastatin (LIPITOR) 40 MG tablet Take 1 tablet by mouth every day 90 tablet 2  . metoprolol  succinate (TOPROL-XL) 25 MG 24 hr tablet Take 25 mg by mouth at bedtime.     [name] oxyCODONE-acetaminophen (PERCOCET) 5-325 MG tablet Take 2 tablets by mouth every 4 (four) hours as needed. 6 tablet 0  . oxyCODONE-acetaminophen (PERCOCET) 5-325 MG tablet Take 1 tablet by mouth every 6 (six) hours as needed. 10 tablet 0  . warfarin (COUMADIN) 10 MG tablet Take 1/2 tablet daily or as directed (Patient taking differently: Take 5 mg by mouth daily. ) 90 tablet 3   No current facility-administered medications for this visit.    Allergies:  Codeine   Social History: The patient  reports that he quit smoking about 24 years ago. His smoking use included cigarettes. He started smoking about 59 years ago. He has a 87.50 pack-year smoking history. He has never used smokeless tobacco. He reports that he drinks alcohol. He reports that he does not use drugs.   Family History: The patient's family history includes Colon cancer in his brother; Heart attack in his brother; Leukemia in his brother and sister; Lung cancer in his brother.   ROS:  Please see the history of present illness. Otherwise, complete review of systems is positive for {NONE DEFAULTED:18576::"none"}.  All other systems are reviewed and negative.   Physical Exam: VS:  There were no vitals taken for this visit., BMI There is no height or  weight on file to calculate BMI.  Wt Readings from Last 3 Encounters:  02/10/17 200 lb (90.7 kg)  01/18/17 201 lb (91.2 kg)  05/30/16 202 lb 9.6 oz (91.9 kg)    General: Patient appears comfortable at rest. HEENT: Conjunctiva and lids normal, oropharynx clear with moist mucosa. Neck: Supple, no elevated JVP or carotid bruits, no thyromegaly. Lungs: Clear to auscultation, nonlabored breathing at rest. Cardiac: Regular rate and rhythm, no S3 or significant systolic murmur, no pericardial rub. Abdomen: Soft, nontender, no hepatomegaly, bowel sounds present, no guarding or rebound. Extremities: No pitting  edema, distal pulses 2+. Skin: Warm and dry. Musculoskeletal: No kyphosis. Neuropsychiatric: Alert and oriented x3, affect grossly appropriate.  ECG: I personally reviewed the tracing from 03/30/2014 which showed probable sinus tachycardia with prolonged PR interval and nonspecific ST changes.  Recent Labwork: 01/18/2017: BUN 27; Creatinine, Ser 1.76; Hemoglobin 13.6; Platelets 213; Potassium 3.6; Sodium 135   Other Studies Reviewed Today:  Chest x-ray 02/19/2016: FINDINGS: Sternotomy wires overlie normal cardiac silhouette. No effusion, infiltrate pneumothorax. Chronic bronchitic markings. Midline sternotomy.  IMPRESSION: No acute findings.  Chronic bronchitic markings.  Assessment and Plan:   Current medicines were reviewed with the patient today.  No orders of the defined types were placed in this encounter.   Disposition:  Signed, Stephen Sark, MD, [facility] 12/14/2017 11:26 AM    [facility]. [address], [place], [place] [address] Phone: [phone]; Fax: [phone]

## 2017-12-15 ENCOUNTER — Ambulatory Visit: Payer: Medicare Other | Admitting: Cardiology

## 2017-12-27 ENCOUNTER — Ambulatory Visit (INDEPENDENT_AMBULATORY_CARE_PROVIDER_SITE_OTHER): Payer: Medicare Other | Admitting: *Deleted

## 2017-12-27 DIAGNOSIS — I359 Nonrheumatic aortic valve disorder, unspecified: Secondary | ICD-10-CM

## 2017-12-27 DIAGNOSIS — Z952 Presence of prosthetic heart valve: Secondary | ICD-10-CM

## 2017-12-27 DIAGNOSIS — Z5181 Encounter for therapeutic drug level monitoring: Secondary | ICD-10-CM | POA: Diagnosis not present

## 2017-12-27 LAB — POCT INR: INR: 3 (ref 2.0–3.0)

## 2017-12-27 NOTE — Patient Instructions (Signed)
Continue coumadin 1/2 tablet daily Recheck in 6 weeks Continue greens

## 2018-01-16 NOTE — Progress Notes (Signed)
Cardiology Office Note    Date:  01/17/2018   ID:  Stephen Moses 05/28/1945, MRN [id number]  PCP:  [name], MD  Cardiologist: [name], MD    Chief Complaint  Patient presents with  . Follow-up    Overdue Annual Visit    History of Present Illness:    Stephen Moses is a 72 y.o. male with past medical history of bicuspid aortic valve (s/p mechanical AVR in 2010), AAA (s/p repair in 2010), HTN, and HLD who presents to the office today for overdue annual follow-up.  He was last examined by [name] as an outpatient in 06/2015 and denied any recent chest pain or dyspnea on exertion at that time. He was exercising for up to 30 minutes every day without any anginal symptoms and was continued on his current medication regimen at that time.  In the interim, he was admitted to [facility] in 05/2016 for evaluation of rectal bleeding and was found to have an INR of 10.5. Was also found to have diverticulitis and was treated with Ciprofloxacin and Flagyl. Was followed closely by pharmacy and GI during admission and INR returned to within normal limits prior to discharge. He has been followed closely by the Coumadin clinic since and INR was stable at 3.0 when checked on 12/27/2017.  In talking with the patient and his wife today, he reports overall doing well from a cardiac perspective since his last office visit. He exercises approximately 5 to 6 days per week at his home gym and lifts weights, walks on the treadmill, and rides a stationary bike during this timeframe. He denies any associated chest pain or dyspnea on exertion when performing these activities. No recent orthopnea, PND, lower extremity edema, palpitations, dizziness, or presyncope.  He did just purchase a blood pressure cuff earlier today and plans to follow this routinely. BP is well-controlled at 136/82 during today's visit.   Past Medical History:  Diagnosis Date  . Ascending aortic aneurysm ([name])  2010    23-mm St. Jude mechanical valve conduit and re-implantation of the coronary arteries  . Bicuspid aortic valve   . Calculus of kidney   . Essential hypertension   . Long term current use of anticoagulant   . Normocytic anemia   . Pancytopenia ([facility] Moses)   . Paraspinal muscle spasm     Past Surgical History:  Procedure Laterality Date  . BACK SURGERY  2009  . CARDIAC VALVE REPLACEMENT  2010  . COLONOSCOPY    . COLONOSCOPY N/A 10/16/2014   Dr. Laural Golden: two colon polyps (tubular adenomas), scattered diverticula at sigmoid, small hemorrhoids above and below dentate line, surveillance due in 2021.   [name] EYE SURGERY    . Left rotator cuff repair    . SHOULDER SURGERY Bilateral     Current Medications: Outpatient Medications Prior to Visit  Medication Sig Dispense Refill  . acetaminophen (TYLENOL) 500 MG tablet Take 500 mg by mouth every 6 (six) hours as needed for mild pain or moderate pain.    [name] amLODipine (NORVASC) 5 MG tablet Take 5 mg by mouth at bedtime.     [name] atorvastatin (LIPITOR) 40 MG tablet Take 1 tablet by mouth every day 90 tablet 2  . atorvastatin (LIPITOR) 40 MG tablet TAKE 1 TABLET BY MOUTH EVERY DAY 90 tablet 2  . metoprolol succinate (TOPROL-XL) 25 MG 24 hr tablet Take 25 mg by mouth at bedtime.     [name] oxyCODONE-acetaminophen (PERCOCET) 5-325 MG tablet  Take 1 tablet by mouth every 6 (six) hours as needed. 10 tablet 0  . warfarin (COUMADIN) 10 MG tablet Take 1/2 tablet daily or as directed (Patient taking differently: Take 5 mg by mouth daily. ) 90 tablet 3  . atorvastatin (LIPITOR) 40 MG tablet     . oxyCODONE-acetaminophen (PERCOCET) 5-325 MG tablet Take 2 tablets by mouth every 4 (four) hours as needed. 6 tablet 0   No facility-administered medications prior to visit.      Allergies:   Codeine   Social History   Socioeconomic History  . Marital status: Married    Spouse name: Not on file  . Number of children: Not on file  . Years of education: Not on file  .  Highest education level: Not on file  Occupational History  . Not on file  Social Needs  . Financial resource strain: Not on file  . Food insecurity:    Worry: Not on file    Inability: Not on file  . Transportation needs:    Medical: Not on file    Non-medical: Not on file  Tobacco Use  . Smoking status: Former Smoker    Packs/day: 2.50    Years: 35.00    Pack years: 87.50    Types: Cigarettes    Start date: 11/22/1958    Last attempt to quit: 11/21/1993    Years since quitting: 24.1  . Smokeless tobacco: Never Used  Substance and Sexual Activity  . Alcohol use: Not Currently    Alcohol/week: 0.0 standard drinks    Comment: occasional beer  . Drug use: No  . Sexual activity: Not on file  Lifestyle  . Physical activity:    Days per week: Not on file    Minutes per session: Not on file  . Stress: Not on file  Relationships  . Social connections:    Talks on phone: Not on file    Gets together: Not on file    Attends religious service: Not on file    Active member of club or organization: Not on file    Attends meetings of clubs or organizations: Not on file    Relationship status: Not on file  Other Topics Concern  . Not on file  Social History Narrative  . Not on file     Family History:  The patient's family history includes Colon cancer in his brother; Heart attack in his brother; Leukemia in his brother and sister; Lung cancer in his brother.   Review of Systems:   Please see the history of present illness.     General:  No chills, fever, night sweats or weight changes.  Cardiovascular:  No chest pain, dyspnea on exertion, edema, orthopnea, palpitations, paroxysmal nocturnal dyspnea. Dermatological: No rash, lesions/masses Respiratory: No cough, dyspnea Urologic: No hematuria, dysuria Abdominal:   No nausea, vomiting, diarrhea, bright red blood per rectum, melena, or hematemesis Neurologic:  No visual changes, wkns, changes in mental status.  He denies any  of the above symptoms.   All other systems reviewed and are otherwise negative except as noted above.   Physical Exam:    VS:  BP 136/82   Pulse 68   Ht 5\' 8"  (1.727 m)   Wt 202 lb (91.6 kg)   SpO2 95%   BMI 30.71 kg/m    General: Well developed, well nourished Caucasian male appearing in no acute distress. Head: Normocephalic, atraumatic, sclera non-icteric, no xanthomas, nares are without discharge.  Neck: No carotid bruits.  JVD not elevated.  Lungs: Respirations regular and unlabored, without wheezes or rales.  Heart: Regular rate and rhythm. No S3 or S4.  Crisp mechanical valve sounds.  Abdomen: Soft, non-tender, non-distended with normoactive bowel sounds. No hepatomegaly. No rebound/guarding. No obvious abdominal masses. Msk:  Strength and tone appear normal for age. No joint deformities or effusions. Extremities: No clubbing or cyanosis. No lower extremity edema.  Distal pedal pulses are 2+ bilaterally. Neuro: Alert and oriented X 3. Moves all extremities spontaneously. No focal deficits noted. Psych:  Responds to questions appropriately with a normal affect. Skin: No rashes or lesions noted  Wt Readings from Last 3 Encounters:  01/17/18 202 lb (91.6 kg)  02/10/17 200 lb (90.7 kg)  01/18/17 201 lb (91.2 kg)     Studies/Labs Reviewed:   EKG:  EKG is ordered today. The ekg ordered today demonstrates sinus bradycardia, HR 59, with TWI along inferior leads (noted on prior tracings).   Recent Labs: 01/18/2017: BUN 27; Creatinine, Ser 1.76; Hemoglobin 13.6; Platelets 213; Potassium 3.6; Sodium 135   Lipid Panel No results found for: CHOL, TRIG, HDL, CHOLHDL, VLDL, LDLCALC, LDLDIRECT  Additional studies/ records that were reviewed today include:   Carotid Dopplers: 2010 IMPRESSION:   1.  Early right carotid bifurcation plaque without significant stenosis. The exam does not exclude plaque ulceration or embolization.  Continued surveillance  recommended.   Assessment:    1. Bicuspid aortic valve   2. H/O aortic valve replacement   3. S/P AAA repair   4. Essential hypertension   5. Mixed hyperlipidemia      Plan:   In order of problems listed above:  1. Bicuspid Aortic Valve - s/p mechanical AVR in 2010. He reports good compliance with Coumadin and INR was 3.0 on most recent check. Denies any evidence of active bleeding.  - he denies any recent dyspnea and crisp mechanical valve sounds are appreciated on examination. Will plan to obtain a repeat echocardiogram to assess valve function and gradients as he has not had repeat imaging since his initial surgery in 2010.  2. AAA - s/p repair in 2010 at the time of AVR. CT Chest in 05/2014 showed stable surgical changes from aortic valve replacement surgery and ascending aortic graft.  3. HTN - BP is well controlled at 136/82 during today's visit. Continue Amlodipine 5 mg daily and Toprol-XL 25 mg daily. Encouraged to follow BP in the ambulatory setting.   4. HLD - Followed by PCP. He remains on Atorvastatin 40 mg daily. Will request recent records.    Medication Adjustments/Labs and Tests Ordered: Current medicines are reviewed at length with the patient today.  Concerns regarding medicines are outlined above.  Medication changes, Labs and Tests ordered today are listed in the Patient Instructions below. Patient Instructions  Medication Instructions:  Your physician recommends that you continue on your current medications as directed. Please refer to the Current Medication list given to you today.  Labwork: NONE  Testing/Procedures: Your physician has requested that you have an echocardiogram. Echocardiography is a painless test that uses sound waves to create images of your heart. It provides your doctor with information about the size and shape of your heart and how well your heart's chambers and valves are working. This procedure takes approximately one hour. There  are no restrictions for this procedure.  Follow-Up: Your physician wants you to follow-up in: 1 YEAR.  You will receive a reminder letter in the mail two months in advance. If you don't  receive a letter, please call our office to schedule the follow-up appointment.  Any Other Special Instructions Will Be Listed Below (If Applicable).  If you need a refill on your cardiac medications before your next appointment, please call your pharmacy.    Signed, Erma Heritage, PA-C  01/17/2018 2:05 PM    [facility] [address] [place], [place] [address] Phone: [phone]

## 2018-01-17 ENCOUNTER — Ambulatory Visit (INDEPENDENT_AMBULATORY_CARE_PROVIDER_SITE_OTHER): Payer: Medicare Other | Admitting: Student

## 2018-01-17 ENCOUNTER — Encounter: Payer: Self-pay | Admitting: Student

## 2018-01-17 ENCOUNTER — Other Ambulatory Visit: Payer: Self-pay | Admitting: Cardiovascular Disease

## 2018-01-17 VITALS — BP 136/82 | HR 68 | Ht 68.0 in | Wt 202.0 lb

## 2018-01-17 DIAGNOSIS — Z9889 Other specified postprocedural states: Secondary | ICD-10-CM | POA: Diagnosis not present

## 2018-01-17 DIAGNOSIS — Z8679 Personal history of other diseases of the circulatory system: Secondary | ICD-10-CM

## 2018-01-17 DIAGNOSIS — Z952 Presence of prosthetic heart valve: Secondary | ICD-10-CM

## 2018-01-17 DIAGNOSIS — Q231 Congenital insufficiency of aortic valve: Secondary | ICD-10-CM | POA: Diagnosis not present

## 2018-01-17 DIAGNOSIS — I10 Essential (primary) hypertension: Secondary | ICD-10-CM | POA: Diagnosis not present

## 2018-01-17 DIAGNOSIS — E782 Mixed hyperlipidemia: Secondary | ICD-10-CM

## 2018-01-17 NOTE — Patient Instructions (Signed)
Medication Instructions:  Your physician recommends that you continue on your current medications as directed. Please refer to the Current Medication list given to you today.   Labwork: NONE  Testing/Procedures: Your physician has requested that you have an echocardiogram. Echocardiography is a painless test that uses sound waves to create images of your heart. It provides your doctor with information about the size and shape of your heart and how well your heart's chambers and valves are working. This procedure takes approximately one hour. There are no restrictions for this procedure.    Follow-Up: Your physician wants you to follow-up in: 1 YEAR.  You will receive a reminder letter in the mail two months in advance. If you don't receive a letter, please call our office to schedule the follow-up appointment.   Any Other Special Instructions Will Be Listed Below (If Applicable).     If you need a refill on your cardiac medications before your next appointment, please call your pharmacy.   

## 2018-02-07 ENCOUNTER — Ambulatory Visit (INDEPENDENT_AMBULATORY_CARE_PROVIDER_SITE_OTHER): Payer: Medicare Other | Admitting: *Deleted

## 2018-02-07 ENCOUNTER — Ambulatory Visit (HOSPITAL_COMMUNITY)
Admission: RE | Admit: 2018-02-07 | Discharge: 2018-02-07 | Disposition: A | Discharge status: Home / Self Care | Payer: Medicare Other | Source: Ambulatory Visit | Attending: Cardiology | Admitting: Cardiology

## 2018-02-07 DIAGNOSIS — Z952 Presence of prosthetic heart valve: Secondary | ICD-10-CM | POA: Insufficient documentation

## 2018-02-07 DIAGNOSIS — I359 Nonrheumatic aortic valve disorder, unspecified: Secondary | ICD-10-CM

## 2018-02-07 DIAGNOSIS — Q231 Congenital insufficiency of aortic valve: Secondary | ICD-10-CM | POA: Insufficient documentation

## 2018-02-07 DIAGNOSIS — Z5181 Encounter for therapeutic drug level monitoring: Secondary | ICD-10-CM | POA: Diagnosis not present

## 2018-02-07 LAB — POCT INR: INR: 4.1 — AB (ref 2.0–3.0)

## 2018-02-07 NOTE — Patient Instructions (Signed)
Hold coumadin tonight then resume 1/2 tablet daily Recheck in 3 weeks Continue greens

## 2018-02-07 NOTE — Progress Notes (Signed)
*  PRELIMINARY RESULTS* Echocardiogram 2D Echocardiogram has been performed.  Stephen Moses 02/07/2018, 9:27 AM

## 2018-02-19 ENCOUNTER — Telehealth: Payer: Self-pay | Admitting: Student

## 2018-02-19 NOTE — Telephone Encounter (Signed)
Message from B.Strader PA-C Echocardiogram showed normal pumping function of the heart with a preserved EF of 55-60% with no wall motion abnormalities. Heart wall was slightly thickened which is common to see with a history of HTN. His aortic valve was functioning normally with no significant leakage. Overall, a reassuring study.      I gave results to wife, copied pcp

## 2018-02-19 NOTE — Telephone Encounter (Signed)
Spoke with wife who is requesting results of echo. Informed wife that results are in chart and will be called after MD has resulted to nursing staff.

## 2018-02-19 NOTE — Telephone Encounter (Signed)
Patient's wife calling in regards to results of echo done on 12/18. Called last week as well. / tg

## 2018-03-05 ENCOUNTER — Ambulatory Visit (INDEPENDENT_AMBULATORY_CARE_PROVIDER_SITE_OTHER): Payer: Medicare Other | Admitting: Pharmacist

## 2018-03-05 DIAGNOSIS — Z5181 Encounter for therapeutic drug level monitoring: Secondary | ICD-10-CM

## 2018-03-05 DIAGNOSIS — Z952 Presence of prosthetic heart valve: Secondary | ICD-10-CM

## 2018-03-05 DIAGNOSIS — I359 Nonrheumatic aortic valve disorder, unspecified: Secondary | ICD-10-CM | POA: Diagnosis not present

## 2018-03-05 LAB — POCT INR: INR: 3.2 — AB (ref 2.0–3.0)

## 2018-03-05 NOTE — Patient Instructions (Signed)
Description   Continue 1/2 tablet daily Recheck in 4 weeks Continue greens

## 2018-03-07 ENCOUNTER — Telehealth: Payer: Self-pay | Admitting: *Deleted

## 2018-03-07 NOTE — Telephone Encounter (Signed)
Pt was put on medication (did not give name) and stated he will need an apt to have his coumadin checked. Please give pt a call

## 2018-03-07 NOTE — Telephone Encounter (Signed)
Returned call to pt - he was started on doxycycline 100mg  BID x 7 days. He started therapy on Monday. He will come in to [facility] office tomorrow for INR check (typically seen in [place] office).

## 2018-03-08 ENCOUNTER — Ambulatory Visit (INDEPENDENT_AMBULATORY_CARE_PROVIDER_SITE_OTHER): Payer: Medicare Other | Admitting: Pharmacist

## 2018-03-08 DIAGNOSIS — I359 Nonrheumatic aortic valve disorder, unspecified: Secondary | ICD-10-CM

## 2018-03-08 DIAGNOSIS — Z5181 Encounter for therapeutic drug level monitoring: Secondary | ICD-10-CM

## 2018-03-08 DIAGNOSIS — Z952 Presence of prosthetic heart valve: Secondary | ICD-10-CM

## 2018-03-08 LAB — POCT INR: INR: 4 — AB (ref 2.0–3.0)

## 2018-03-08 NOTE — Patient Instructions (Signed)
Description   No warfarin today (Thursday 1/16) and Saturday (1/18) Then resume 1/2 tablet daily Recheck (usually 4 weeks) on Wednesday Continue greens

## 2018-03-14 ENCOUNTER — Ambulatory Visit (INDEPENDENT_AMBULATORY_CARE_PROVIDER_SITE_OTHER): Payer: Medicare Other | Admitting: Pharmacist

## 2018-03-14 DIAGNOSIS — Z952 Presence of prosthetic heart valve: Secondary | ICD-10-CM | POA: Diagnosis not present

## 2018-03-14 DIAGNOSIS — I359 Nonrheumatic aortic valve disorder, unspecified: Secondary | ICD-10-CM

## 2018-03-14 DIAGNOSIS — Z5181 Encounter for therapeutic drug level monitoring: Secondary | ICD-10-CM

## 2018-03-14 LAB — POCT INR: INR: 2.5 (ref 2.0–3.0)

## 2018-03-14 NOTE — Patient Instructions (Signed)
Description   Continue 1/2 tablet daily Recheck in 4 weeks  Continue greens

## 2018-04-04 ENCOUNTER — Telehealth: Payer: Self-pay | Admitting: Cardiovascular Disease

## 2018-04-04 NOTE — Telephone Encounter (Signed)
Dr. Willey Blade put pt on Nexium 40mg  and wanted to let someone know for his Coumadin

## 2018-04-04 NOTE — Telephone Encounter (Signed)
Pt's wife advised to have pt stay on same dose and keep follow up as scheduled for next week.

## 2018-04-16 ENCOUNTER — Ambulatory Visit (INDEPENDENT_AMBULATORY_CARE_PROVIDER_SITE_OTHER): Payer: Medicare Other | Admitting: *Deleted

## 2018-04-16 DIAGNOSIS — Z5181 Encounter for therapeutic drug level monitoring: Secondary | ICD-10-CM | POA: Diagnosis not present

## 2018-04-16 DIAGNOSIS — I359 Nonrheumatic aortic valve disorder, unspecified: Secondary | ICD-10-CM | POA: Diagnosis not present

## 2018-04-16 DIAGNOSIS — Z952 Presence of prosthetic heart valve: Secondary | ICD-10-CM

## 2018-04-16 LAB — POCT INR: INR: 3.2 — AB (ref 2.0–3.0)

## 2018-04-16 NOTE — Patient Instructions (Signed)
Continue 1/2 tablet daily Recheck in 4 weeks  Continue greens

## 2018-05-10 ENCOUNTER — Telehealth: Payer: Self-pay | Admitting: *Deleted

## 2018-05-10 NOTE — Telephone Encounter (Signed)
1. Have you recently travelled abroad or to [place], [place], or [place]? 2. Do you currently have a fever?  3. Have you been in contact with someone that is currently pending confirmation of COVID19 testing or has been confirmed to have the COVID19 virus?  4. Are you currently experiencing fatigue or cough? 5. Are you currently experiencing new or worsening shortness of breath at rest or with minimal activity?  6. Have you been in contact with someone that was recently sick with fever/cough/fatigue?    **A score of 4 or more should result in cancellation of the pts cardiology appt  **A score of 2 should be provided a mask prior to admission into the lobby  **TRAVEL to a high risk area or contact with a confirmed case should stay at home, away from confirmed patient, monitor symptoms, and reach out to PCP for evisit, additional testing.   **ALL PTS WITH FEVER SHOULD BE REFERRED TO PCP FOR EVISIT  Pt. Advised that we are restricting visitors at this time and request that only patients present for check-in prior to their appointment. All other visitors should remain in their car. If necessary, only one visitor may come with the patient into the building. For everyone's safety, all patients and visitors entering our practice area should expect to be screened again prior to entering our waiting area.   NO ANSWER. LEFT MESSAGE FOR PT TO CALL BACK IF HE IS SYMPTOMATIC OR NEEDS TO RESCHEDULE APPT.

## 2018-05-13 ENCOUNTER — Telehealth: Payer: Self-pay | Admitting: Physician Assistant

## 2018-05-13 NOTE — Telephone Encounter (Signed)
Paged by the patient regarding coumadin visit tomorrow. Patient's wife says they received a message on their answering machine 2 hours ago about potential changes to tomorrow's appt, I do not see any phone call was made. I advised her to call us first thing tomorrow morning to double check

## 2018-05-14 ENCOUNTER — Telehealth: Payer: Self-pay | Admitting: Pharmacist

## 2018-05-14 NOTE — Telephone Encounter (Signed)
1. Do you currently have a fever? no 2. Have you recently travelled on a cruise, internationally, or to [place], [place], [place], [place], [place], or [place], [place] [employer]) ? no 3. Have you been in contact with someone that is currently pending confirmation of Covid19 testing or has been confirmed to have the Covid19 virus?  no 4. Are you currently experiencing fatigue or cough? no  Pt. Advised that we are restricting visitors at this time and anyone present in the vehicle should meet the above criteria as well. Advised that visit will be at curbside for finger stick ONLY and will receive call with instructions.

## 2018-05-15 ENCOUNTER — Ambulatory Visit (INDEPENDENT_AMBULATORY_CARE_PROVIDER_SITE_OTHER): Payer: Medicare Other | Admitting: *Deleted

## 2018-05-15 DIAGNOSIS — Z5181 Encounter for therapeutic drug level monitoring: Secondary | ICD-10-CM

## 2018-05-15 DIAGNOSIS — I359 Nonrheumatic aortic valve disorder, unspecified: Secondary | ICD-10-CM

## 2018-05-15 DIAGNOSIS — Z952 Presence of prosthetic heart valve: Secondary | ICD-10-CM

## 2018-05-15 LAB — POCT INR: INR: 4.7 — AB (ref 2.0–3.0)

## 2018-05-15 NOTE — Patient Instructions (Signed)
Hold coumadin tonight then resume 1/2 tablet daily Recheck in 6 weeks due to COVID 19 Eat extra greens tonight and tomorrow night then resume greens

## 2018-05-19 ENCOUNTER — Encounter (HOSPITAL_COMMUNITY): Payer: Self-pay | Admitting: Emergency Medicine

## 2018-05-19 ENCOUNTER — Other Ambulatory Visit: Payer: Self-pay

## 2018-05-19 ENCOUNTER — Emergency Department (HOSPITAL_COMMUNITY)
Admission: EM | Admit: 2018-05-19 | Discharge: 2018-05-19 | Disposition: A | Discharge status: Home / Self Care | Payer: Medicare Other | Attending: Emergency Medicine | Admitting: Emergency Medicine

## 2018-05-19 DIAGNOSIS — I10 Essential (primary) hypertension: Secondary | ICD-10-CM | POA: Insufficient documentation

## 2018-05-19 DIAGNOSIS — Z7901 Long term (current) use of anticoagulants: Secondary | ICD-10-CM | POA: Diagnosis not present

## 2018-05-19 DIAGNOSIS — Z79899 Other long term (current) drug therapy: Secondary | ICD-10-CM | POA: Diagnosis not present

## 2018-05-19 DIAGNOSIS — R21 Rash and other nonspecific skin eruption: Secondary | ICD-10-CM

## 2018-05-19 MED ORDER — LORATADINE 10 MG PO TABS
10.0000 mg | ORAL_TABLET | Freq: Once | ORAL | Status: AC
  Administered 2018-05-19: 10 mg via ORAL
  Filled 2018-05-19: qty 1

## 2018-05-19 NOTE — ED Provider Notes (Signed)
[facility] EMERGENCY DEPARTMENT Provider Note   CSN: [id number] Arrival date & time: 05/19/18  2032    History   Chief Complaint Chief Complaint  Patient presents with  . Rash    HPI Stephen Moses is a 73 y.o. male.     HPI Patient presents to the emergency room for evaluation of a rash.  Patient states he had been working out in the yard.  He also has a history of having some allergy to potatoes and he did throw a away some potato feels.  Start developing a rash around his right elbow and his left hand.  He went to the pharmacist and they recommended hydrocortisone cream.  Patient states it is very itchy and he has been scratching.  He denies any shortness of breath.  No fevers or chills.  He was concerned because it started to feel warm and wanted to make sure there was not an infection.   PMHX:  Aortic Aneurysm  Bicuspid aortic valve  Calculus of kidney  Essential hypertension  Long term current use of anticoagulant  Normocytic anemia  Pancytopenia (HCC)  Paraspinal muscle spasm     History reviewed. No pertinent surgical history.     Home Medications    acetaminophen (TYLENOL) 500 MG tablet     amLODipine (NORVASC) 5 MG tablet    atorvastatin (LIPITOR) 40 MG tablet    atorvastatin (LIPITOR) 40 MG tablet    metoprolol succinate (TOPROL-XL) 25 MG 24 hr tablet    oxyCODONE-acetaminophen (PERCOCET) 5-325 MG tablet    warfarin (COUMADIN) 10 MG tablet      Family History No family history on file.  Social History Social History   Tobacco Use  . Smoking status: Not on file  Substance Use Topics  . Alcohol use: Not on file  . Drug use: Not on file     Allergies   Patient has no allergy information on record.   Review of Systems Review of Systems  All other systems reviewed and are negative.    Physical Exam Updated Vital Signs BP (!) 179/94 (BP Location: Right Arm)   Pulse 81   Temp 98.1 F (36.7 C) (Oral)   Resp 18   Ht 1.753 m (5\' 9" )    Wt 86.2 kg   SpO2 95%   BMI 28.06 kg/m   Physical Exam Vitals signs and nursing note reviewed.  Constitutional:      General: He is not in acute distress.    Appearance: He is well-developed.  HENT:     Head: Normocephalic and atraumatic.     Right Ear: External ear normal.     Left Ear: External ear normal.     Mouth/Throat:     Mouth: Mucous membranes are moist.     Pharynx: Oropharynx is clear. No posterior oropharyngeal erythema.  Eyes:     General: No scleral icterus.       Right eye: No discharge.        Left eye: No discharge.     Conjunctiva/sclera: Conjunctivae normal.  Neck:     Musculoskeletal: Neck supple.     Trachea: No tracheal deviation.  Cardiovascular:     Rate and Rhythm: Normal rate.  Pulmonary:     Effort: Pulmonary effort is normal. No respiratory distress.     Breath sounds: No stridor.  Abdominal:     General: There is no distension.  Musculoskeletal:        General: No swelling or deformity.  Skin:  General: Skin is warm and dry.     Findings: Erythema present. No rash.     Comments: Mild erythema of the left hand and around the right elbow forearm, small amount of bruising (pt states he has been scratching and he takes coumadin)  Neurological:     Mental Status: He is alert.     Cranial Nerves: Cranial nerve deficit: no gross deficits.      ED Treatments / Results  Labs (all labs ordered are listed, but only abnormal results are displayed) Labs Reviewed - No data to display  EKG None  Radiology No results found.  Procedures Procedures (including critical care time)  Medications Ordered in ED Medications  loratadine (CLARITIN) tablet 10 mg (has no administration in time range)     Initial Impression / Assessment and Plan / ED Course  I have reviewed the triage vital signs and the nursing notes.  Pertinent labs & imaging results that were available during my care of the patient were reviewed by me and considered in my  medical decision making (see chart for details).   Patient has a mild rash on exam.  His symptoms are most suggestive of a contact dermatitis.  No findings to suggest infection.  He does take Coumadin but states he had his levels checked earlier this week.  Patient is already taking hydrocortisone cream.  I will also have him take an antihistamine.  Final Clinical Impressions(s) / ED Diagnoses   Final diagnoses:  Rash    ED Discharge Orders    None       Dorie Rank, MD 05/19/18 2101

## 2018-05-19 NOTE — ED Triage Notes (Signed)
Pt C/O rash and itching to his right forearm/elbow and left hand. Pt denies pain.

## 2018-05-19 NOTE — Discharge Instructions (Addendum)
Take over the counter Claritin antihistamine to help with the itching.  Continue the hydrocortisone cream.  Return for shortness of breath, fever or other concerning symptoms.

## 2018-05-21 ENCOUNTER — Encounter: Payer: Self-pay | Admitting: Student

## 2018-06-25 ENCOUNTER — Telehealth: Payer: Self-pay | Admitting: Cardiovascular Disease

## 2018-06-25 NOTE — Telephone Encounter (Signed)

## 2018-06-26 ENCOUNTER — Ambulatory Visit (INDEPENDENT_AMBULATORY_CARE_PROVIDER_SITE_OTHER): Payer: Medicare Other | Admitting: *Deleted

## 2018-06-26 ENCOUNTER — Other Ambulatory Visit: Payer: Self-pay

## 2018-06-26 DIAGNOSIS — I359 Nonrheumatic aortic valve disorder, unspecified: Secondary | ICD-10-CM

## 2018-06-26 DIAGNOSIS — Z5181 Encounter for therapeutic drug level monitoring: Secondary | ICD-10-CM

## 2018-06-26 DIAGNOSIS — Z952 Presence of prosthetic heart valve: Secondary | ICD-10-CM | POA: Diagnosis not present

## 2018-06-26 LAB — POCT INR: INR: 3 (ref 2.0–3.0)

## 2018-06-26 NOTE — Patient Instructions (Signed)
Continue coumadin 1/2 tablet daily Recheck in 6 weeks due to COVID 19 Continue greens

## 2018-07-09 ENCOUNTER — Encounter: Payer: Self-pay | Admitting: Internal Medicine

## 2018-07-10 IMAGING — CT CT ABD-PELV W/ CM
2 of 4 series · 15 of 46 positions shown, 17 images · IV contrast (Isovue)
Comparison: CT of the abdomen and pelvis performed 07/05/2013

CLINICAL DATA: Acute onset of pink mucus arising from the rectum,
with sharp left lower quadrant abdominal pain. Initial encounter.

EXAM:
CT ABDOMEN AND PELVIS WITH CONTRAST
TECHNIQUE: Multidetector CT imaging of the abdomen and pelvis was performed
using the standard protocol following bolus administration of
intravenous contrast.
CONTRAST:  100mL NN2L05-JOO IOPAMIDOL (NN2L05-JOO) INJECTION 61%

[Series 2: axial st · axial · 0.81mm/px · z∈[+854,+1269]mm · 12 of 93 slices shown, 14 images]
[im 5/93  soft-tissue]
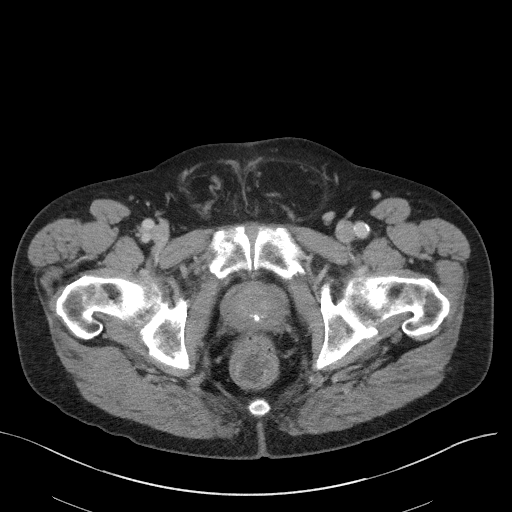
[im 5/93  bone]
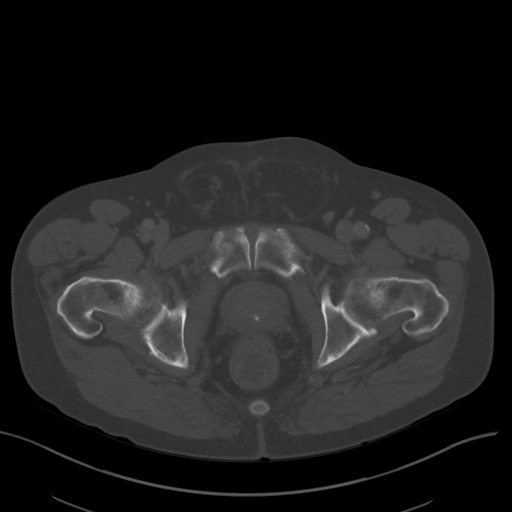
[im 14/93  soft-tissue]
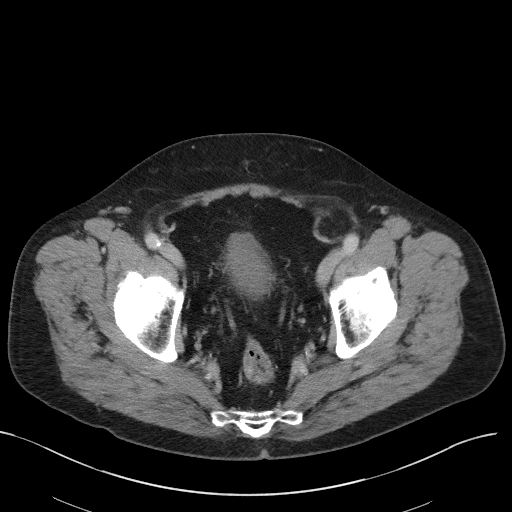
[im 22/93  soft-tissue]
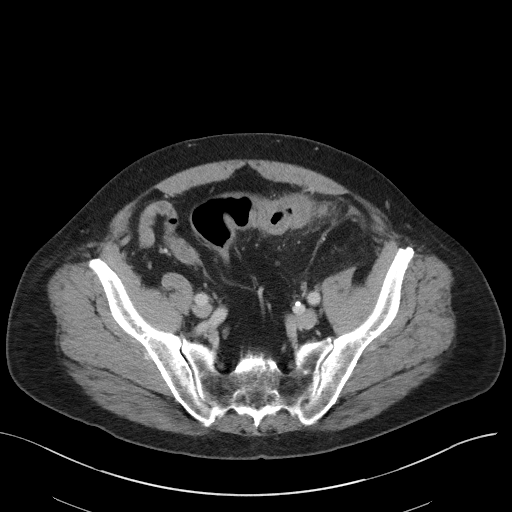
[im 27/93  soft-tissue]
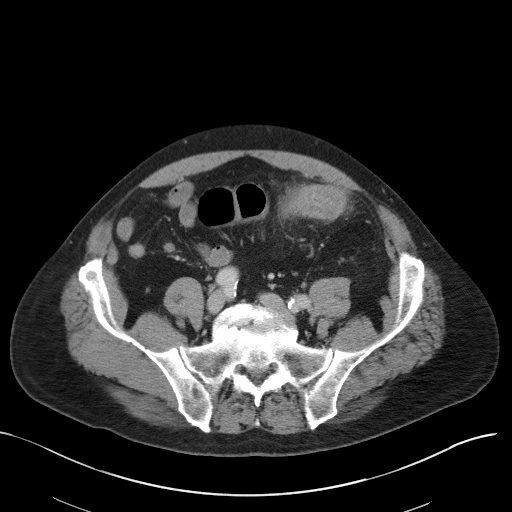
[im 36/93  soft-tissue]
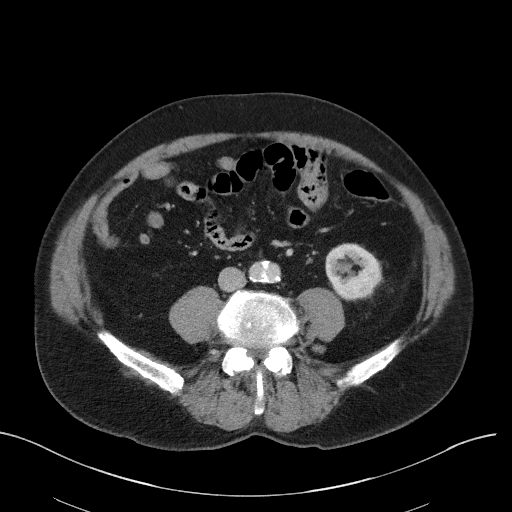
[im 44/93  soft-tissue]
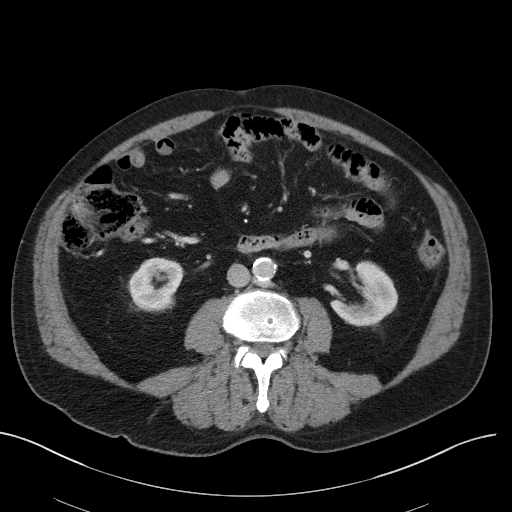
[im 49/93  soft-tissue]
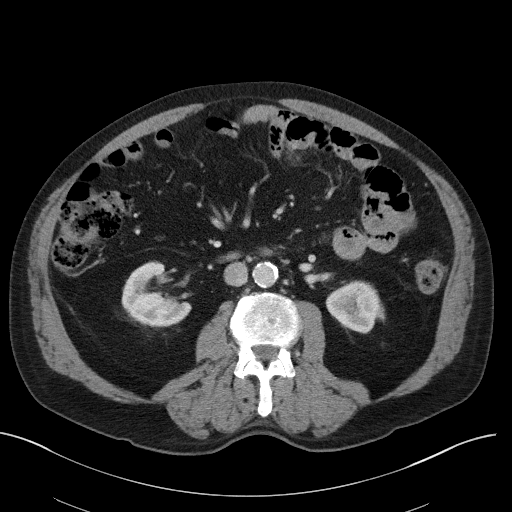
[im 57/93  soft-tissue]
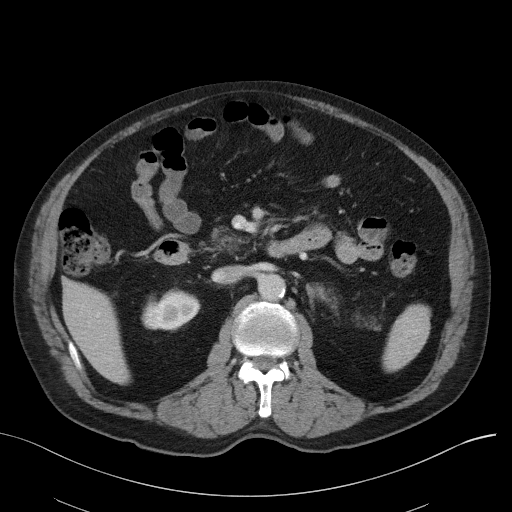
[im 66/93  soft-tissue]
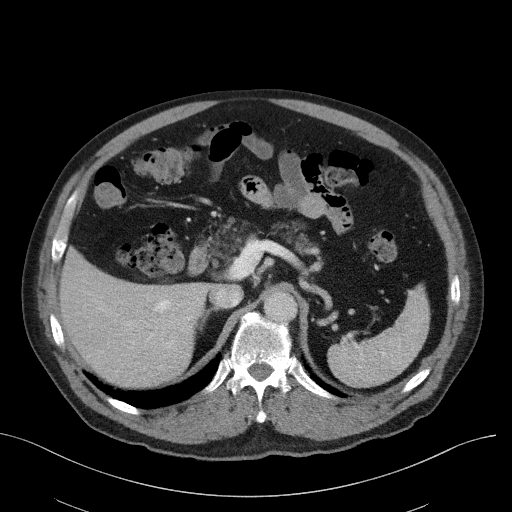
[im 66/93  bone]
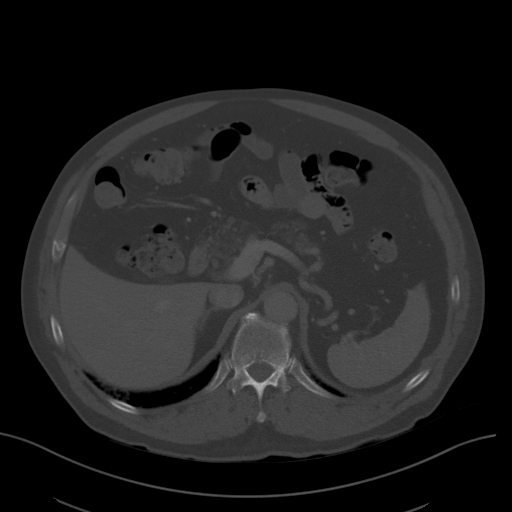
[im 71/93  soft-tissue]
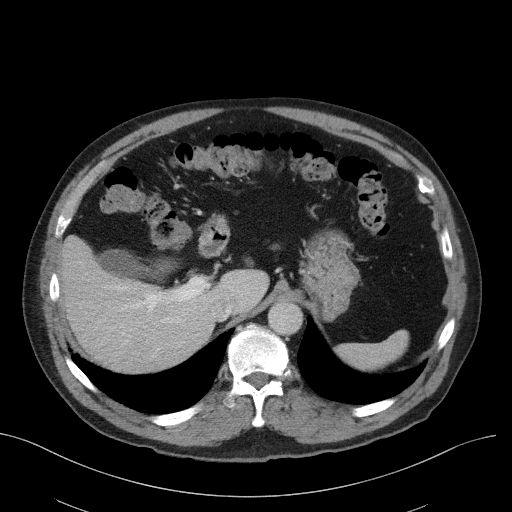
[im 79/93  soft-tissue]
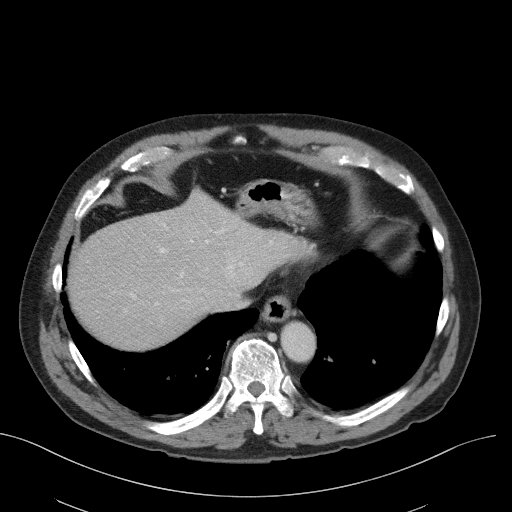
[im 88/93  soft-tissue]
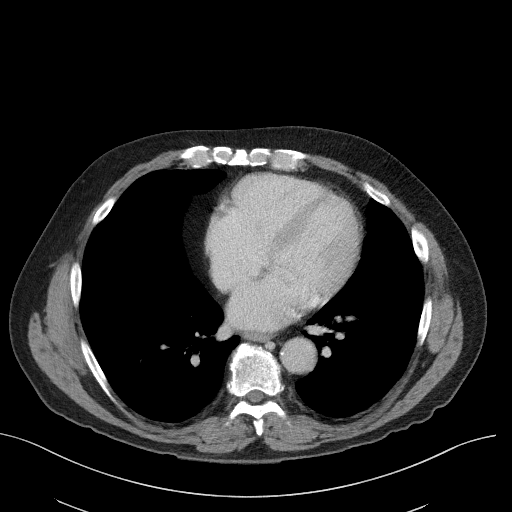

[Series 5: coronal st · coronal · 0.86mm/px · 3 of 117 slices shown]
[im 39/117  soft-tissue]
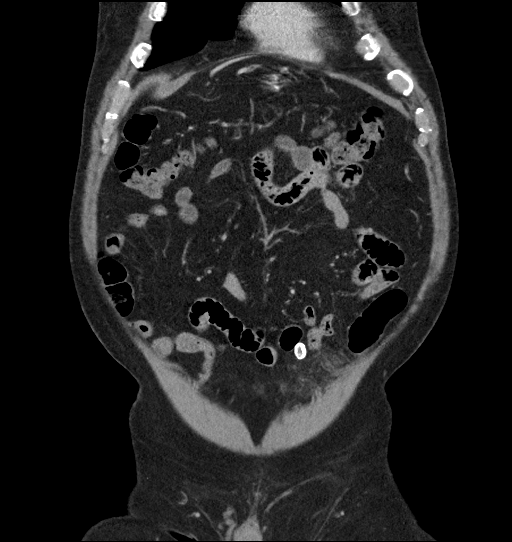
[im 52/117  soft-tissue]
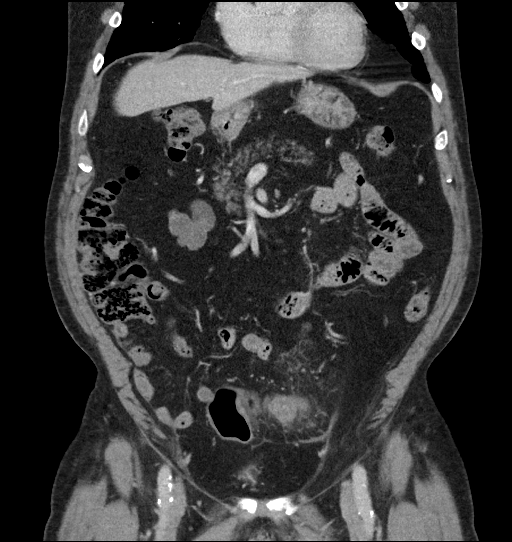
[im 65/117  soft-tissue]
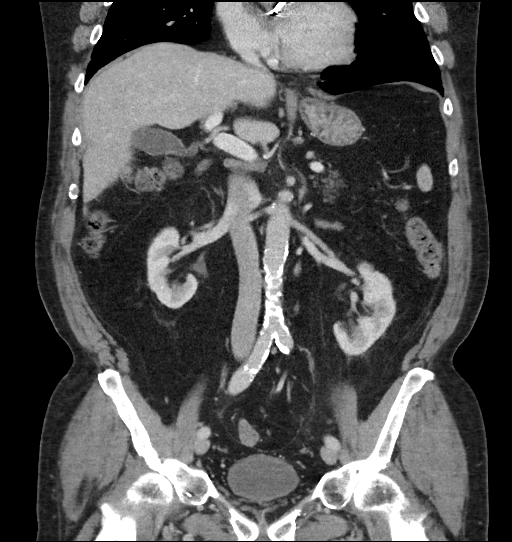

[15 of 46 positions shown; findings below may reference images not displayed]

FINDINGS: Lower chest: An aortic valve replacement is noted. Scattered blebs
are noted at the left lung base.

Hepatobiliary: The liver is unremarkable in appearance. The
gallbladder is unremarkable in appearance. The common bile duct
remains normal in caliber.

Pancreas: The pancreas is within normal limits.

Spleen: A tiny calcified granuloma is noted within the spleen. The
spleen is otherwise unremarkable.

Adrenals/Urinary Tract: The adrenal glands are unremarkable in
appearance. Nonspecific perinephric stranding is noted bilaterally.
There is no evidence of hydronephrosis. No renal or ureteral stones
are identified.

Stomach/Bowel: The stomach is unremarkable in appearance. The small
bowel is within normal limits. The appendix is normal in caliber,
without evidence of appendicitis.

Marked wall thickening is noted at the proximal sigmoid colon, with
inflamed diverticula and surrounding soft inflammation. There is no
definite evidence of perforation or abscess formation. This is
concerning for acute diverticulitis, though focal colitis might have
a similar appearance.

There also appears to be an enhancing vessel tracking into the
rectum, with suspicion of internal hemorrhoids.

Vascular/Lymphatic: Diffuse calcification is seen along the
abdominal aorta and its branches. The abdominal aorta is otherwise
grossly unremarkable. The inferior vena cava is grossly
unremarkable. No retroperitoneal lymphadenopathy is seen. No pelvic
sidewall lymphadenopathy is identified.

Reproductive: The prostate is enlarged, measuring 5.1 cm in
transverse dimension, with scattered calcification. There is
impression on the base of the bladder from the prostate. The bladder
is mildly distended and grossly unremarkable.

Other: A moderate left inguinal hernia is seen, containing only fat.

Musculoskeletal: No acute osseous abnormalities are identified. The
visualized musculature is unremarkable in appearance.
IMPRESSION: 1. Marked wall thickening at the proximal sigmoid colon, with
inflamed diverticula and surrounding soft tissue inflammation,
concerning for acute diverticulitis, though focal colitis might have
a similar appearance. No definite evidence of perforation or abscess
formation at this time.
2. Apparent enhancing vessel noted tracking into the rectum, with
suspicion of internal hemorrhoids.
3. Diffuse aortic atherosclerosis.
4. Enlarged prostate.
5. Moderate left inguinal hernia, containing only fat.
6. Scattered blebs at the left lung base.

## 2018-08-07 ENCOUNTER — Ambulatory Visit (INDEPENDENT_AMBULATORY_CARE_PROVIDER_SITE_OTHER): Payer: Medicare Other | Admitting: *Deleted

## 2018-08-07 DIAGNOSIS — I359 Nonrheumatic aortic valve disorder, unspecified: Secondary | ICD-10-CM | POA: Diagnosis not present

## 2018-08-07 DIAGNOSIS — Z5181 Encounter for therapeutic drug level monitoring: Secondary | ICD-10-CM

## 2018-08-07 DIAGNOSIS — Z952 Presence of prosthetic heart valve: Secondary | ICD-10-CM

## 2018-08-07 LAB — POCT INR: INR: 2.9 (ref 2.0–3.0)

## 2018-08-07 NOTE — Patient Instructions (Signed)
Continue coumadin 1/2 tablet daily Recheck in 6 weeks due to COVID 19 Continue greens

## 2018-09-18 ENCOUNTER — Ambulatory Visit (INDEPENDENT_AMBULATORY_CARE_PROVIDER_SITE_OTHER): Payer: Medicare Other | Admitting: *Deleted

## 2018-09-18 DIAGNOSIS — I359 Nonrheumatic aortic valve disorder, unspecified: Secondary | ICD-10-CM

## 2018-09-18 DIAGNOSIS — Z952 Presence of prosthetic heart valve: Secondary | ICD-10-CM

## 2018-09-18 DIAGNOSIS — Z5181 Encounter for therapeutic drug level monitoring: Secondary | ICD-10-CM | POA: Diagnosis not present

## 2018-09-18 LAB — POCT INR: INR: 4.2 — AB (ref 2.0–3.0)

## 2018-09-18 NOTE — Patient Instructions (Signed)
Hold coumadin tonight then resume 1/2 tablet daily Recheck in 6 weeks due to COVID 19 Continue greens

## 2018-11-01 ENCOUNTER — Ambulatory Visit (INDEPENDENT_AMBULATORY_CARE_PROVIDER_SITE_OTHER): Payer: Medicare Other | Admitting: *Deleted

## 2018-11-01 ENCOUNTER — Other Ambulatory Visit: Payer: Self-pay

## 2018-11-01 DIAGNOSIS — Z952 Presence of prosthetic heart valve: Secondary | ICD-10-CM

## 2018-11-01 DIAGNOSIS — I359 Nonrheumatic aortic valve disorder, unspecified: Secondary | ICD-10-CM | POA: Diagnosis not present

## 2018-11-01 DIAGNOSIS — Z5181 Encounter for therapeutic drug level monitoring: Secondary | ICD-10-CM

## 2018-11-01 LAB — POCT INR: INR: 3.3 — AB (ref 2.0–3.0)

## 2018-11-01 NOTE — Patient Instructions (Signed)
Continue coumadin 1/2 tablet daily Recheck in 6 weeks due to COVID 19 Continue greens 

## 2018-12-13 ENCOUNTER — Other Ambulatory Visit: Payer: Self-pay

## 2018-12-13 ENCOUNTER — Ambulatory Visit (INDEPENDENT_AMBULATORY_CARE_PROVIDER_SITE_OTHER): Payer: Medicare Other | Admitting: *Deleted

## 2018-12-13 DIAGNOSIS — Z952 Presence of prosthetic heart valve: Secondary | ICD-10-CM

## 2018-12-13 DIAGNOSIS — Z5181 Encounter for therapeutic drug level monitoring: Secondary | ICD-10-CM

## 2018-12-13 DIAGNOSIS — I359 Nonrheumatic aortic valve disorder, unspecified: Secondary | ICD-10-CM | POA: Diagnosis not present

## 2018-12-13 LAB — POCT INR: INR: 3.3 — AB (ref 2.0–3.0)

## 2018-12-13 NOTE — Patient Instructions (Signed)
Continue coumadin 1/2 tablet daily Recheck in 6 weeks due to COVID 19 Continue greens 

## 2018-12-24 ENCOUNTER — Other Ambulatory Visit: Payer: Self-pay | Admitting: Cardiovascular Disease

## 2019-01-14 ENCOUNTER — Telehealth: Payer: Self-pay | Admitting: Cardiovascular Disease

## 2019-01-14 NOTE — Telephone Encounter (Signed)
Virtual Visit Pre-Appointment Phone Call  "(Name), I am calling you today to discuss your upcoming appointment. We are currently trying to limit exposure to the virus that causes COVID-19 by seeing patients at home rather than in the office."  1. "What is the BEST phone number to call the day of the visit?" - include this in appointment notes  2. Do you have or have access to (through a family member/friend) a smartphone with video capability that we can use for your visit?" a. If yes - list this number in appt notes as cell (if different from BEST phone #) and list the appointment type as a VIDEO visit in appointment notes b. If no - list the appointment type as a PHONE visit in appointment notes  3. Confirm consent - "In the setting of the current Covid19 crisis, you are scheduled for a (phone or video) visit with your provider on (date) at (time).  Just as we do with many in-office visits, in order for you to participate in this visit, we must obtain consent.  If you'd like, I can send this to your mychart (if signed up) or email for you to review.  Otherwise, I can obtain your verbal consent now.  All virtual visits are billed to your insurance company just like a normal visit would be.  By agreeing to a virtual visit, we'd like you to understand that the technology does not allow for your provider to perform an examination, and thus may limit your provider's ability to fully assess your condition. If your provider identifies any concerns that need to be evaluated in person, we will make arrangements to do so.  Finally, though the technology is pretty good, we cannot assure that it will always work on either your or our end, and in the setting of a video visit, we may have to convert it to a phone-only visit.  In either situation, we cannot ensure that we have a secure connection.  Are you willing to proceed?" STAFF: Did the patient verbally acknowledge consent to telehealth visit? Document  YES/NO here: yes  4. Advise patient to be prepared - "Two hours prior to your appointment, go ahead and check your blood pressure, pulse, oxygen saturation, and your weight (if you have the equipment to check those) and write them all down. When your visit starts, your provider will ask you for this information. If you have an Apple Watch or Kardia device, please plan to have heart rate information ready on the day of your appointment. Please have a pen and paper handy nearby the day of the visit as well."  5. Give patient instructions for MyChart download to smartphone OR Doximity/Doxy.me as below if video visit (depending on what platform provider is using)  6. Inform patient they will receive a phone call 15 minutes prior to their appointment time (may be from unknown caller ID) so they should be prepared to answer    TELEPHONE CALL NOTE  Stephen Moses has been deemed a candidate for a follow-up tele-health visit to limit community exposure during the Covid-19 pandemic. I spoke with the patient via phone to ensure availability of phone/video source, confirm preferred email & phone number, and discuss instructions and expectations.  I reminded Stephen Moses to be prepared with any vital sign and/or heart rhythm information that could potentially be obtained via home monitoring, at the time of his visit. I reminded Stephen Moses to expect a phone call prior to  his visit.  Stephen Moses 01/14/2019 10:03 AM   INSTRUCTIONS FOR DOWNLOADING THE MYCHART APP TO SMARTPHONE  - The patient must first make sure to have activated MyChart and know their login information - If Apple, go to [employer] and type in MyChart in the search bar and download the app. If Android, ask patient to go to [employer] and type in [place] in the search bar and download the app. The app is free but as with any other app downloads, their phone may require them to verify saved payment information or  Apple/Android password.  - The patient will need to then log into the app with their MyChart username and password, and select [facility] as their healthcare provider to link the account. When it is time for your visit, go to the MyChart app, find appointments, and click Begin Video Visit. Be sure to Select Allow for your device to access the Microphone and Camera for your visit. You will then be connected, and your provider will be with you shortly.  **If they have any issues connecting, or need assistance please contact MyChart service desk [phone])**  **If using a computer, in order to ensure the best quality for their visit they will need to use either of the following Internet Browsers: [employer], or Google Chrome**  IF USING DOXIMITY or DOXY.ME - The patient will receive a link just prior to their visit by text.     FULL LENGTH CONSENT FOR TELE-HEALTH VISIT   I hereby voluntarily request, consent and authorize [facility] and its employed or contracted physicians, physician assistants, nurse practitioners or other licensed health care professionals (the Practitioner), to provide me with telemedicine health care services (the Services") as deemed necessary by the treating Practitioner. I acknowledge and consent to receive the Services by the Practitioner via telemedicine. I understand that the telemedicine visit will involve communicating with the Practitioner through live audiovisual communication technology and the disclosure of certain medical information by electronic transmission. I acknowledge that I have been given the opportunity to request an in-person assessment or other available alternative prior to the telemedicine visit and am voluntarily participating in the telemedicine visit.  I understand that I have the right to withhold or withdraw my consent to the use of telemedicine in the course of my care at any time, without affecting my right to future care  or treatment, and that the Practitioner or I may terminate the telemedicine visit at any time. I understand that I have the right to inspect all information obtained and/or recorded in the course of the telemedicine visit and may receive copies of available information for a reasonable fee.  I understand that some of the potential risks of receiving the Services via telemedicine include:   Delay or interruption in medical evaluation due to technological equipment failure or disruption;  Information transmitted may not be sufficient (e.g. poor resolution of images) to allow for appropriate medical decision making by the Practitioner; and/or   In rare instances, security protocols could fail, causing a breach of personal health information.  Furthermore, I acknowledge that it is my responsibility to provide information about my medical history, conditions and care that is complete and accurate to the best of my ability. I acknowledge that Practitioner's advice, recommendations, and/or decision may be based on factors not within their control, such as incomplete or inaccurate data provided by me or distortions of diagnostic images or specimens that may result from electronic transmissions. I  understand that the practice of medicine is not an exact science and that Practitioner makes no warranties or guarantees regarding treatment outcomes. I acknowledge that I will receive a copy of this consent concurrently upon execution via email to the email address I last provided but may also request a printed copy by calling the office of [facility].    I understand that my insurance will be billed for this visit.   I have read or had this consent read to me.  I understand the contents of this consent, which adequately explains the benefits and risks of the Services being provided via telemedicine.   I have been provided ample opportunity to ask questions regarding this consent and the Services and have had  my questions answered to my satisfaction.  I give my informed consent for the services to be provided through the use of telemedicine in my medical care  By participating in this telemedicine visit I agree to the above.

## 2019-01-21 ENCOUNTER — Telehealth: Payer: Medicare Other | Admitting: Cardiovascular Disease

## 2019-01-21 ENCOUNTER — Encounter: Payer: Self-pay | Admitting: Cardiovascular Disease

## 2019-01-21 ENCOUNTER — Telehealth (INDEPENDENT_AMBULATORY_CARE_PROVIDER_SITE_OTHER): Payer: Medicare Other | Admitting: Cardiovascular Disease

## 2019-01-21 VITALS — BP 124/76 | HR 83 | Ht 68.0 in | Wt 200.0 lb

## 2019-01-21 DIAGNOSIS — R0602 Shortness of breath: Secondary | ICD-10-CM

## 2019-01-21 DIAGNOSIS — Z952 Presence of prosthetic heart valve: Secondary | ICD-10-CM | POA: Diagnosis not present

## 2019-01-21 DIAGNOSIS — I10 Essential (primary) hypertension: Secondary | ICD-10-CM

## 2019-01-21 DIAGNOSIS — Z8679 Personal history of other diseases of the circulatory system: Secondary | ICD-10-CM

## 2019-01-21 DIAGNOSIS — Z9889 Other specified postprocedural states: Secondary | ICD-10-CM

## 2019-01-21 DIAGNOSIS — E782 Mixed hyperlipidemia: Secondary | ICD-10-CM

## 2019-01-21 NOTE — Patient Instructions (Signed)

## 2019-01-21 NOTE — Progress Notes (Signed)
Virtual Visit via Telephone Note   This visit type was conducted due to national recommendations for restrictions regarding the COVID-19 Pandemic (e.g. social distancing) in an effort to limit this patient's exposure and mitigate transmission in our community.  Due to his co-morbid illnesses, this patient is at least at moderate risk for complications without adequate follow up.  This format is felt to be most appropriate for this patient at this time.  The patient did not have access to video technology/had technical difficulties with video requiring transitioning to audio format only (telephone).  All issues noted in this document were discussed and addressed.  No physical exam could be performed with this format.  Please refer to the patient's chart for his  consent to telehealth for [facility].   Date:  01/21/2019   ID:  Stephen Moses, DOB Jul 22, 1945, MRN [id number]  Patient Location: Home Provider Location: Office  PCP:  Patient, No Pcp Per  Cardiologist:  [name], MD  Electrophysiologist:  None   Evaluation Performed:  Follow-Up Visit  Chief Complaint:  S/p AVR  History of Present Illness:    Stephen Moses is a 73 y.o. male with past medical history of bicuspid aortic valve (s/p mechanical AVR in 2010), AAA (s/p repair in 2010), HTN, and HLD.  He saw a B.  Strader PA-C on 01/17/2018.  I have not personally evaluated him since April 2018.  He is has been having some shortness of breath for the past week and a half. He denies fevers and cough as well as headaches. He denies leg swelling.  He denies chest pain.  He walks a mile every other day and also uses a recumbent bicycle.  He has chronic low back pain and knee pain.   Past Medical History:  Diagnosis Date  . Ascending aortic aneurysm ([facility]) 2010    23-mm St. Jude mechanical valve conduit and re-implantation of the coronary arteries  . Bicuspid aortic valve   . Calculus of kidney   . Essential  hypertension   . Long term current use of anticoagulant   . Normocytic anemia   . Pancytopenia ([facility])   . Paraspinal muscle spasm    Past Surgical History:  Procedure Laterality Date  . BACK SURGERY  2009  . CARDIAC VALVE REPLACEMENT  2010  . COLONOSCOPY    . COLONOSCOPY N/A 10/16/2014   Dr. Laural Golden: two colon polyps (tubular adenomas), scattered diverticula at sigmoid, small hemorrhoids above and below dentate line, surveillance due in 2021.   [name] EYE SURGERY    . Left rotator cuff repair    . SHOULDER SURGERY Bilateral      Current Meds  Medication Sig  . acetaminophen (TYLENOL) 500 MG tablet Take 500 mg by mouth every 6 (six) hours as needed for mild pain or moderate pain.  [name] amLODipine (NORVASC) 5 MG tablet Take 5 mg by mouth at bedtime.   [name] atorvastatin (LIPITOR) 40 MG tablet Take 1 tablet by mouth every day  . metoprolol succinate (TOPROL-XL) 25 MG 24 hr tablet Take 25 mg by mouth at bedtime.   [name] warfarin (COUMADIN) 10 MG tablet Take 1/2 tablet daily or as directed (Patient taking differently: Take 5 mg by mouth daily. MANAGED BY LISA)     Allergies:   Codeine   Social History   Tobacco Use  . Smoking status: Former Smoker    Packs/day: 2.50    Years: 35.00    Pack years: 87.50    Types:  Cigarettes    Start date: 11/22/1958    Quit date: 11/21/1993    Years since quitting: 25.1  . Smokeless tobacco: Never Used  Substance Use Topics  . Alcohol use: Not Currently    Alcohol/week: 0.0 standard drinks    Comment: occasional beer  . Drug use: No     Family Hx: The patient's family history includes Colon cancer in his brother; Heart attack in his brother; Leukemia in his brother and sister; Lung cancer in his brother.  ROS:   Please see the history of present illness.     All other systems reviewed and are negative.   Prior CV studies:   The following studies were reviewed today:  Echocardiogram 02/07/2018:  - Left ventricle: The cavity size was normal. Wall  thickness was   increased in a pattern of moderate LVH. Systolic function was   normal. The estimated ejection fraction was in the range of 55%   to 60%. Wall motion was normal; there were no regional wall   motion abnormalities. Doppler parameters are consistent with   abnormal left ventricular relaxation (grade 1 diastolic   dysfunction). - Aortic valve: There is a 23 mm St Jude mechanical valve is   present in the AV position. Mildly calcified annulus. Mildly   thickened leaflets. There was no stenosis. There was no   significant regurgitation. Mean gradient (S): 15 mm Hg. - Mitral valve: Mildly calcified annulus. Mildly thickened leaflets  Labs/Other Tests and Data Reviewed:    EKG:  No ECG reviewed.  Recent Labs: No results found for requested labs within last 8760 hours.   Recent Lipid Panel No results found for: CHOL, TRIG, HDL, CHOLHDL, LDLCALC, LDLDIRECT  Wt Readings from Last 3 Encounters:  01/21/19 200 lb (90.7 kg)  05/19/18 190 lb (86.2 kg)  01/17/18 202 lb (91.6 kg)     Objective:    Vital Signs:  BP 124/76   Pulse 83   Ht 5\' 8"  (1.727 m)   Wt 200 lb (90.7 kg)   BMI 30.41 kg/m    VITAL SIGNS:  reviewed  ASSESSMENT & PLAN:    1.  Bicuspid aortic valve: Status post mechanical aortic valve replacement in 2010.  Echocardiogram in December 2019 demonstrated normal valve function.  Anticoagulated with warfarin.  2.  Abdominal aortic aneurysm: Status post repair in 2010.  3.  Hypertension: Blood pressure is normal.  No changes today.  4.  Hyperlipidemia: Continue atorvastatin.  5.  Shortness of breath: This has been going on for a week and a half.  There is no associated chest pain, leg swelling, orthopnea or paroxysmal nocturnal dyspnea.  He denies fevers and cough.  He will contact his PCP.    COVID-19 Education: The signs and symptoms of COVID-19 were discussed with the patient and how to seek care for testing (follow up with PCP or arrange E-visit).   The importance of social distancing was discussed today.  Time:   Today, I have spent 10 minutes with the patient with telehealth technology discussing the above problems.     Medication Adjustments/Labs and Tests Ordered: Current medicines are reviewed at length with the patient today.  Concerns regarding medicines are outlined above.   Tests Ordered: No orders of the defined types were placed in this encounter.   Medication Changes: No orders of the defined types were placed in this encounter.   Follow Up:  Virtual Visit  in 1 year(s)  Signed, [name], MD  01/21/2019 11:32  AM    [place]

## 2019-01-24 ENCOUNTER — Other Ambulatory Visit: Payer: Self-pay

## 2019-01-24 ENCOUNTER — Ambulatory Visit (INDEPENDENT_AMBULATORY_CARE_PROVIDER_SITE_OTHER): Payer: Medicare Other | Admitting: *Deleted

## 2019-01-24 DIAGNOSIS — Z5181 Encounter for therapeutic drug level monitoring: Secondary | ICD-10-CM

## 2019-01-24 DIAGNOSIS — I359 Nonrheumatic aortic valve disorder, unspecified: Secondary | ICD-10-CM

## 2019-01-24 DIAGNOSIS — Z952 Presence of prosthetic heart valve: Secondary | ICD-10-CM

## 2019-01-24 LAB — POCT INR: INR: 4.2 — AB (ref 2.0–3.0)

## 2019-01-24 NOTE — Patient Instructions (Signed)
Hold warfarin tonight then resume 1/2 tablet daily Recheck in 4 weeks due to COVID 19 Continue greens

## 2019-01-28 ENCOUNTER — Other Ambulatory Visit: Payer: Self-pay

## 2019-01-28 ENCOUNTER — Other Ambulatory Visit (HOSPITAL_COMMUNITY): Payer: Self-pay | Admitting: Internal Medicine

## 2019-01-28 ENCOUNTER — Ambulatory Visit (HOSPITAL_COMMUNITY)
Admission: RE | Admit: 2019-01-28 | Discharge: 2019-01-28 | Disposition: A | Discharge status: Home / Self Care | Payer: Medicare Other | Source: Ambulatory Visit | Attending: Internal Medicine | Admitting: Internal Medicine

## 2019-01-28 DIAGNOSIS — R0602 Shortness of breath: Secondary | ICD-10-CM | POA: Diagnosis not present

## 2019-02-21 ENCOUNTER — Ambulatory Visit (INDEPENDENT_AMBULATORY_CARE_PROVIDER_SITE_OTHER): Payer: Medicare Other | Admitting: *Deleted

## 2019-02-21 ENCOUNTER — Other Ambulatory Visit: Payer: Self-pay

## 2019-02-21 DIAGNOSIS — Z952 Presence of prosthetic heart valve: Secondary | ICD-10-CM | POA: Diagnosis not present

## 2019-02-21 DIAGNOSIS — I359 Nonrheumatic aortic valve disorder, unspecified: Secondary | ICD-10-CM

## 2019-02-21 DIAGNOSIS — Z5181 Encounter for therapeutic drug level monitoring: Secondary | ICD-10-CM | POA: Diagnosis not present

## 2019-02-21 LAB — POCT INR: INR: 3 (ref 2.0–3.0)

## 2019-02-21 NOTE — Patient Instructions (Signed)
Continue warfarin 1/2 tablet daily. Recheck in 4 weeks Continue greens 

## 2019-02-28 ENCOUNTER — Other Ambulatory Visit: Payer: Self-pay | Admitting: Cardiovascular Disease

## 2019-03-04 DIAGNOSIS — R0602 Shortness of breath: Secondary | ICD-10-CM | POA: Diagnosis not present

## 2019-03-04 DIAGNOSIS — J439 Emphysema, unspecified: Secondary | ICD-10-CM | POA: Diagnosis not present

## 2019-03-08 ENCOUNTER — Telehealth: Payer: Self-pay | Admitting: Cardiovascular Disease

## 2019-03-08 NOTE — Telephone Encounter (Signed)
It is fine

## 2019-03-08 NOTE — Telephone Encounter (Signed)
Patient called stating that Dr. Willey Blade put him on Methocarbamol 500 mg . He is wanting to know if this will interfere with his blood thinner.

## 2019-03-08 NOTE — Telephone Encounter (Signed)
Pt voiced understanding

## 2019-03-12 ENCOUNTER — Other Ambulatory Visit: Payer: Self-pay | Admitting: Internal Medicine

## 2019-03-12 DIAGNOSIS — R0602 Shortness of breath: Secondary | ICD-10-CM

## 2019-03-21 ENCOUNTER — Ambulatory Visit (INDEPENDENT_AMBULATORY_CARE_PROVIDER_SITE_OTHER): Payer: Medicare PPO | Admitting: *Deleted

## 2019-03-21 ENCOUNTER — Other Ambulatory Visit: Payer: Self-pay

## 2019-03-21 DIAGNOSIS — Z5181 Encounter for therapeutic drug level monitoring: Secondary | ICD-10-CM | POA: Diagnosis not present

## 2019-03-21 DIAGNOSIS — I359 Nonrheumatic aortic valve disorder, unspecified: Secondary | ICD-10-CM

## 2019-03-21 DIAGNOSIS — Z952 Presence of prosthetic heart valve: Secondary | ICD-10-CM

## 2019-03-21 LAB — POCT INR: INR: 3.7 — AB (ref 2.0–3.0)

## 2019-03-21 NOTE — Patient Instructions (Signed)
Hold warfarin tonight then resume 1/2 tablet daily Recheck in 4 weeks Continue greens

## 2019-03-25 ENCOUNTER — Other Ambulatory Visit: Payer: Self-pay

## 2019-03-25 ENCOUNTER — Ambulatory Visit (HOSPITAL_COMMUNITY)
Admission: RE | Admit: 2019-03-25 | Discharge: 2019-03-25 | Disposition: A | Discharge status: Home / Self Care | Payer: Medicare PPO | Source: Ambulatory Visit | Attending: Internal Medicine | Admitting: Internal Medicine

## 2019-03-25 DIAGNOSIS — R0602 Shortness of breath: Secondary | ICD-10-CM | POA: Diagnosis not present

## 2019-03-25 DIAGNOSIS — J8410 Pulmonary fibrosis, unspecified: Secondary | ICD-10-CM | POA: Diagnosis not present

## 2019-03-25 DIAGNOSIS — J432 Centrilobular emphysema: Secondary | ICD-10-CM | POA: Diagnosis not present

## 2019-04-05 DIAGNOSIS — R040 Epistaxis: Secondary | ICD-10-CM | POA: Diagnosis not present

## 2019-04-16 DIAGNOSIS — R040 Epistaxis: Secondary | ICD-10-CM | POA: Diagnosis not present

## 2019-04-18 ENCOUNTER — Other Ambulatory Visit: Payer: Self-pay

## 2019-04-18 ENCOUNTER — Ambulatory Visit (INDEPENDENT_AMBULATORY_CARE_PROVIDER_SITE_OTHER): Payer: Medicare PPO | Admitting: *Deleted

## 2019-04-18 DIAGNOSIS — Z5181 Encounter for therapeutic drug level monitoring: Secondary | ICD-10-CM | POA: Diagnosis not present

## 2019-04-18 DIAGNOSIS — I359 Nonrheumatic aortic valve disorder, unspecified: Secondary | ICD-10-CM | POA: Diagnosis not present

## 2019-04-18 DIAGNOSIS — Z952 Presence of prosthetic heart valve: Secondary | ICD-10-CM | POA: Diagnosis not present

## 2019-04-18 LAB — POCT INR: INR: 3.5 — AB (ref 2.0–3.0)

## 2019-04-18 NOTE — Patient Instructions (Signed)
Continue warfarin 1/2 tablet daily. Recheck in 4 weeks Continue greens 

## 2019-04-19 DIAGNOSIS — R040 Epistaxis: Secondary | ICD-10-CM | POA: Diagnosis not present

## 2019-05-08 DIAGNOSIS — K219 Gastro-esophageal reflux disease without esophagitis: Secondary | ICD-10-CM | POA: Diagnosis not present

## 2019-05-08 DIAGNOSIS — E669 Obesity, unspecified: Secondary | ICD-10-CM | POA: Diagnosis not present

## 2019-05-08 DIAGNOSIS — M1990 Unspecified osteoarthritis, unspecified site: Secondary | ICD-10-CM | POA: Diagnosis not present

## 2019-05-08 DIAGNOSIS — Z952 Presence of prosthetic heart valve: Secondary | ICD-10-CM | POA: Diagnosis not present

## 2019-05-08 DIAGNOSIS — G8929 Other chronic pain: Secondary | ICD-10-CM | POA: Diagnosis not present

## 2019-05-08 DIAGNOSIS — Z809 Family history of malignant neoplasm, unspecified: Secondary | ICD-10-CM | POA: Diagnosis not present

## 2019-05-08 DIAGNOSIS — E785 Hyperlipidemia, unspecified: Secondary | ICD-10-CM | POA: Diagnosis not present

## 2019-05-08 DIAGNOSIS — Z7901 Long term (current) use of anticoagulants: Secondary | ICD-10-CM | POA: Diagnosis not present

## 2019-05-08 DIAGNOSIS — J309 Allergic rhinitis, unspecified: Secondary | ICD-10-CM | POA: Diagnosis not present

## 2019-05-08 DIAGNOSIS — Z87891 Personal history of nicotine dependence: Secondary | ICD-10-CM | POA: Diagnosis not present

## 2019-05-08 DIAGNOSIS — I10 Essential (primary) hypertension: Secondary | ICD-10-CM | POA: Diagnosis not present

## 2019-05-16 ENCOUNTER — Ambulatory Visit (INDEPENDENT_AMBULATORY_CARE_PROVIDER_SITE_OTHER): Payer: Medicare PPO | Admitting: *Deleted

## 2019-05-16 ENCOUNTER — Other Ambulatory Visit: Payer: Self-pay

## 2019-05-16 DIAGNOSIS — Z5181 Encounter for therapeutic drug level monitoring: Secondary | ICD-10-CM | POA: Diagnosis not present

## 2019-05-16 DIAGNOSIS — Z952 Presence of prosthetic heart valve: Secondary | ICD-10-CM

## 2019-05-16 DIAGNOSIS — I359 Nonrheumatic aortic valve disorder, unspecified: Secondary | ICD-10-CM | POA: Diagnosis not present

## 2019-05-16 LAB — POCT INR: INR: 4.7 — AB (ref 2.0–3.0)

## 2019-05-16 NOTE — Patient Instructions (Signed)
Hold warfarin tonight then decrease dose to 1/2 tablet daily except none on Sundays Recheck in 3 weeks Continue greens

## 2019-05-17 ENCOUNTER — Encounter: Payer: Self-pay | Admitting: Internal Medicine

## 2019-05-17 ENCOUNTER — Ambulatory Visit: Payer: Medicare PPO | Admitting: Internal Medicine

## 2019-05-17 VITALS — BP 144/78 | HR 69 | Temp 97.7°F | Ht 68.0 in | Wt 204.8 lb

## 2019-05-17 DIAGNOSIS — J432 Centrilobular emphysema: Secondary | ICD-10-CM

## 2019-05-17 DIAGNOSIS — J84112 Idiopathic pulmonary fibrosis: Secondary | ICD-10-CM

## 2019-05-17 NOTE — Progress Notes (Addendum)
Stephen Moses    [id number]    1945/10/04  Primary Care Physician:Fagan, Carloyn Manner, MD  Referring Physician: [name], MD [address] [place],  [place] [address] Reason for Consultation: "pulmonary fibrosis" Date of Consultation: 05/17/2019  Chief complaint:   Chief Complaint  Patient presents with  . Consult     HPI: Stephen Moses is a 74 y.o. gentleman with 80+ pack year smoking history and bicuspid aortic valve s/p St. Judes MV repair who presents for new patient evaluation for pulmonary fibrosis.   Does have a history of childhood episodic dyspnea. No recurrent pneumonia/bronchitis. However late last year had another episode of shortness of breath. Had a chest xray done in December 2020.  This was concerning for bibasilar infiltrates which was followed with a CT scan that showed   He has had progressive dyspnea with heavy exertion. 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 But does have dyspnea with heavy lifting and carrying things for a while. Six months ago he was walking much more and working out ligtingweights Now mobility is more limited by joint pains, back pain, than dyspnea. Used to walk 2 miles/day.   No weight loss, no hospitalizations for dyspnea or pneumonia. Has back pains and hip pain, but no arthralgias, no myalgias or muscle weakness. No rashes.   Social history:  Occupation: retired from Theatre manager, Games developer work.  Exposures: Lives at home with his wife Smoking history: 80+ pack year smoking history.   Social History   Occupational History  . Not on file  Tobacco Use  . Smoking status: Former Smoker    Packs/day: 2.50    Years: 35.00    Pack years: 87.50    Types: Cigarettes    Start date: 11/22/1958    Quit date: 11/21/1993    Years since quitting: 25.5  . Smokeless tobacco: Never Used  Substance and Sexual Activity  . Alcohol use: Not Currently    Alcohol/week: 0.0 standard drinks   Comment: occasional beer  . Drug use: No  . Sexual activity: Not on file    Relevant family history: Family History  Problem Relation Age of Onset  . Leukemia Brother   . Heart attack Brother   . Lung cancer Brother   . Colon cancer Brother        less than age 42.   [name] Leukemia Sister     Past Medical History:  Diagnosis Date  . Ascending aortic aneurysm ([place]) 2010    23-mm St. Jude mechanical valve conduit and re-implantation of the coronary arteries  . Bicuspid aortic valve   . Calculus of kidney   . Essential hypertension   . Long term current use of anticoagulant   . Normocytic anemia   . Pancytopenia ([place])   . Paraspinal muscle spasm     Past Surgical History:  Procedure Laterality Date  . BACK SURGERY  2009  . CARDIAC VALVE REPLACEMENT  2010  . COLONOSCOPY    . COLONOSCOPY N/A 10/16/2014   Dr. Laural Golden: two colon polyps (tubular adenomas), scattered diverticula at sigmoid, small hemorrhoids above and below dentate line, surveillance due in 2021.   [name] EYE SURGERY    . Left rotator cuff repair    . SHOULDER SURGERY Bilateral     Review of systems: Review of Systems  Constitutional: Negative for chills, fever and weight loss.  HENT: Negative for congestion, sinus pain and sore throat.  Eyes: Negative for discharge and redness.  Respiratory: Positive for shortness of breath. Negative for cough, hemoptysis, sputum production and wheezing.   Cardiovascular: Negative for chest pain, palpitations and leg swelling.  Gastrointestinal: Negative for heartburn, nausea and vomiting.  Musculoskeletal: Negative for joint pain and myalgias.  Skin: Negative for rash.  Neurological: Negative for dizziness, tremors, focal weakness and headaches.  Endo/Heme/Allergies: Negative for environmental allergies.  Psychiatric/Behavioral: Negative for depression. The patient is not nervous/anxious.   All other systems reviewed and are negative.   Physical Exam: Blood pressure (!)  144/78, pulse 69, temperature 97.7 F (36.5 C), temperature source Temporal, height 5\' 8"  (1.727 m), weight 204 lb 12.8 oz (92.9 kg), SpO2 98 %. Gen:      No acute distress Lungs:    Faint bibasilar crackles, No increased respiratory effort, symmetric chest wall excursion, clear to auscultation bilaterally CV:         Mechanical AVR auscultated, Regular rate and rhythm, No pedal edema Abd:      + bowel sounds; soft, non-tender; no distension MSK: no acute synovitis of DIP or PIP joints, no mechanics hands.  Skin:      Warm and dry; no rashes Neuro: normal speech, no focal facial asymmetry Psych: alert and oriented x3, normal mood and affect   Data Reviewed/Medical Decision Making:  Independent interpretation of tests: Imaging: . Review of patient's CT Chest Dec 2020 images revealed upper lobe predominant centrilobular emphysema and mild lower lobe predominant fibrosis. The patient's images have been independently reviewed by me.  Minimal change from ct chest performed in 2016  PFTs: Never had  Immunization status:  Immunization History  Administered Date(s) Administered  . Influenza, High Dose Seasonal PF 10/23/2018  . Moderna SARS-COVID-2 Vaccination 03/18/2019, 04/15/2019    . I reviewed prior external note(s) from [name] . I reviewed the result(s) of the labs and imaging as noted above.  . I have ordered PFTs   Assessment:  Centrilobular Emphysema Pulmonary fibrosis, likely Idiopathic Pulmonary Fibrosis   Plan/Recommendations:  Mr. Ficco has mild pulmonary fibrosis which has had minimal progression when compared to scans from 2017.  He feels at present his mobility is more limited from his joint pains and back pain rather than his dyspnea. Will obtain some baseline spirometry and see him back in 6 months to repeat the testingt then.  If there has been progression in his symptoms or lung function decline, can consider anti-fibrotic therapy at this time.   We  discussed disease management and progression at length today.   Return to Care: Return in about 6 months (around 11/17/2019).  Lenice Llamas, MD [facility] [place]  CC: [name], MD

## 2019-05-17 NOTE — Patient Instructions (Signed)
The patient should have follow up scheduled with myself in 6 months.   Prior to next visit patient should have: Spirometry now   Please come see me sooner if breathing changes in the next 6 months.

## 2019-05-23 DIAGNOSIS — Z961 Presence of intraocular lens: Secondary | ICD-10-CM | POA: Diagnosis not present

## 2019-05-23 DIAGNOSIS — H524 Presbyopia: Secondary | ICD-10-CM | POA: Diagnosis not present

## 2019-06-03 ENCOUNTER — Other Ambulatory Visit (HOSPITAL_COMMUNITY)
Admission: RE | Admit: 2019-06-03 | Discharge: 2019-06-03 | Disposition: A | Discharge status: Home / Self Care | Payer: Medicare PPO | Source: Ambulatory Visit | Attending: Internal Medicine | Admitting: Internal Medicine

## 2019-06-03 ENCOUNTER — Other Ambulatory Visit: Payer: Self-pay

## 2019-06-03 DIAGNOSIS — Z20822 Contact with and (suspected) exposure to covid-19: Secondary | ICD-10-CM | POA: Diagnosis not present

## 2019-06-03 DIAGNOSIS — Z01812 Encounter for preprocedural laboratory examination: Secondary | ICD-10-CM | POA: Insufficient documentation

## 2019-06-03 LAB — SARS CORONAVIRUS 2 (TAT 6-24 HRS): SARS Coronavirus 2: NEGATIVE

## 2019-06-05 ENCOUNTER — Other Ambulatory Visit: Payer: Self-pay

## 2019-06-05 ENCOUNTER — Ambulatory Visit (INDEPENDENT_AMBULATORY_CARE_PROVIDER_SITE_OTHER): Payer: Medicare PPO | Admitting: *Deleted

## 2019-06-05 DIAGNOSIS — Z952 Presence of prosthetic heart valve: Secondary | ICD-10-CM | POA: Diagnosis not present

## 2019-06-05 DIAGNOSIS — Z5181 Encounter for therapeutic drug level monitoring: Secondary | ICD-10-CM | POA: Diagnosis not present

## 2019-06-05 DIAGNOSIS — I359 Nonrheumatic aortic valve disorder, unspecified: Secondary | ICD-10-CM | POA: Diagnosis not present

## 2019-06-05 LAB — POCT INR: INR: 2.2 (ref 2.0–3.0)

## 2019-06-05 NOTE — Patient Instructions (Signed)
Take 1 tablet tonight then resume 1/2 tablet daily except none on Sundays Recheck in 4 weeks Continue greens

## 2019-06-06 ENCOUNTER — Ambulatory Visit: Payer: Medicare PPO

## 2019-06-06 DIAGNOSIS — J84112 Idiopathic pulmonary fibrosis: Secondary | ICD-10-CM

## 2019-06-06 DIAGNOSIS — J432 Centrilobular emphysema: Secondary | ICD-10-CM

## 2019-07-03 ENCOUNTER — Other Ambulatory Visit: Payer: Self-pay

## 2019-07-03 ENCOUNTER — Ambulatory Visit (INDEPENDENT_AMBULATORY_CARE_PROVIDER_SITE_OTHER): Payer: Medicare PPO | Admitting: *Deleted

## 2019-07-03 DIAGNOSIS — Z5181 Encounter for therapeutic drug level monitoring: Secondary | ICD-10-CM | POA: Diagnosis not present

## 2019-07-03 DIAGNOSIS — Z952 Presence of prosthetic heart valve: Secondary | ICD-10-CM

## 2019-07-03 DIAGNOSIS — I359 Nonrheumatic aortic valve disorder, unspecified: Secondary | ICD-10-CM

## 2019-07-03 LAB — POCT INR: INR: 3 (ref 2.0–3.0)

## 2019-07-03 NOTE — Patient Instructions (Signed)
Continue warfarin 1/2 tablet daily except none on Sundays Recheck in 4 weeks Continue greens

## 2019-07-08 DIAGNOSIS — J8410 Pulmonary fibrosis, unspecified: Secondary | ICD-10-CM | POA: Diagnosis not present

## 2019-07-08 DIAGNOSIS — J439 Emphysema, unspecified: Secondary | ICD-10-CM | POA: Diagnosis not present

## 2019-07-08 DIAGNOSIS — Z6831 Body mass index (BMI) 31.0-31.9, adult: Secondary | ICD-10-CM | POA: Diagnosis not present

## 2019-07-08 DIAGNOSIS — I712 Thoracic aortic aneurysm, without rupture: Secondary | ICD-10-CM | POA: Diagnosis not present

## 2019-07-31 ENCOUNTER — Ambulatory Visit (INDEPENDENT_AMBULATORY_CARE_PROVIDER_SITE_OTHER): Payer: Medicare PPO | Admitting: Pharmacist

## 2019-07-31 ENCOUNTER — Other Ambulatory Visit: Payer: Self-pay

## 2019-07-31 DIAGNOSIS — Z952 Presence of prosthetic heart valve: Secondary | ICD-10-CM

## 2019-07-31 DIAGNOSIS — Z5181 Encounter for therapeutic drug level monitoring: Secondary | ICD-10-CM | POA: Diagnosis not present

## 2019-07-31 DIAGNOSIS — I359 Nonrheumatic aortic valve disorder, unspecified: Secondary | ICD-10-CM | POA: Diagnosis not present

## 2019-07-31 LAB — POCT INR: INR: 2.5 (ref 2.0–3.0)

## 2019-07-31 NOTE — Patient Instructions (Signed)
Description   Continue warfarin 1/2 tablet daily except none on Sundays Recheck in 5 weeks Continue greens

## 2019-08-27 ENCOUNTER — Other Ambulatory Visit: Payer: Self-pay | Admitting: Cardiovascular Disease

## 2019-08-28 ENCOUNTER — Telehealth: Payer: Self-pay

## 2019-08-28 MED ORDER — ATORVASTATIN CALCIUM 40 MG PO TABS: ORAL_TABLET | ORAL | 2 refills | Status: DC

## 2019-08-28 NOTE — Telephone Encounter (Signed)
Patients wife called stating that she has questions about recent refill on Atorvastatin .

## 2019-08-28 NOTE — Telephone Encounter (Signed)
Contacted wife and advise that atorvastatin #30 was sent in error and #90 would be sent now. Apology expressed. Verbalized understanding.

## 2019-09-04 ENCOUNTER — Ambulatory Visit (INDEPENDENT_AMBULATORY_CARE_PROVIDER_SITE_OTHER): Payer: Medicare PPO | Admitting: *Deleted

## 2019-09-04 DIAGNOSIS — Z5181 Encounter for therapeutic drug level monitoring: Secondary | ICD-10-CM | POA: Diagnosis not present

## 2019-09-04 DIAGNOSIS — Z952 Presence of prosthetic heart valve: Secondary | ICD-10-CM | POA: Diagnosis not present

## 2019-09-04 DIAGNOSIS — I359 Nonrheumatic aortic valve disorder, unspecified: Secondary | ICD-10-CM | POA: Diagnosis not present

## 2019-09-04 LAB — POCT INR: INR: 1.5 — AB (ref 2.0–3.0)

## 2019-09-04 NOTE — Patient Instructions (Signed)
Take 1 tablet tonight and tomorrow night then increase dose to 1/2 tablet daily Recheck in 2 weeks Continue greens

## 2019-09-18 ENCOUNTER — Ambulatory Visit (INDEPENDENT_AMBULATORY_CARE_PROVIDER_SITE_OTHER): Payer: Medicare PPO | Admitting: *Deleted

## 2019-09-18 DIAGNOSIS — I359 Nonrheumatic aortic valve disorder, unspecified: Secondary | ICD-10-CM

## 2019-09-18 DIAGNOSIS — Z5181 Encounter for therapeutic drug level monitoring: Secondary | ICD-10-CM

## 2019-09-18 DIAGNOSIS — Z952 Presence of prosthetic heart valve: Secondary | ICD-10-CM | POA: Diagnosis not present

## 2019-09-18 LAB — POCT INR: INR: 5.8 — AB (ref 2.0–3.0)

## 2019-09-18 NOTE — Patient Instructions (Signed)
Hold warfarin tonight and tomorrow night then decrease dose back to 1/2 tablet daily except none on Sundays Recheck in 2 weeks Continue greens

## 2019-09-23 ENCOUNTER — Other Ambulatory Visit: Payer: Self-pay | Admitting: Internal Medicine

## 2019-09-23 DIAGNOSIS — I712 Thoracic aortic aneurysm, without rupture, unspecified: Secondary | ICD-10-CM

## 2019-10-02 ENCOUNTER — Other Ambulatory Visit: Payer: Self-pay

## 2019-10-02 ENCOUNTER — Ambulatory Visit (INDEPENDENT_AMBULATORY_CARE_PROVIDER_SITE_OTHER): Payer: Medicare PPO | Admitting: *Deleted

## 2019-10-02 DIAGNOSIS — I359 Nonrheumatic aortic valve disorder, unspecified: Secondary | ICD-10-CM

## 2019-10-02 DIAGNOSIS — Z952 Presence of prosthetic heart valve: Secondary | ICD-10-CM | POA: Diagnosis not present

## 2019-10-02 DIAGNOSIS — Z5181 Encounter for therapeutic drug level monitoring: Secondary | ICD-10-CM

## 2019-10-02 LAB — POCT INR: INR: 2.2 (ref 2.0–3.0)

## 2019-10-02 NOTE — Patient Instructions (Signed)
Take warfarin 1 tablet tonight then resume 1/2 tablet daily except none on Sundays Recheck in 4 weeks Continue greens

## 2019-10-04 ENCOUNTER — Other Ambulatory Visit: Payer: Self-pay

## 2019-10-04 ENCOUNTER — Ambulatory Visit (HOSPITAL_COMMUNITY)
Admission: RE | Admit: 2019-10-04 | Discharge: 2019-10-04 | Disposition: A | Discharge status: Home / Self Care | Payer: Medicare PPO | Source: Ambulatory Visit | Attending: Internal Medicine | Admitting: Internal Medicine

## 2019-10-04 DIAGNOSIS — I712 Thoracic aortic aneurysm, without rupture, unspecified: Secondary | ICD-10-CM

## 2019-10-04 DIAGNOSIS — J439 Emphysema, unspecified: Secondary | ICD-10-CM | POA: Diagnosis not present

## 2019-10-04 DIAGNOSIS — I700 Atherosclerosis of aorta: Secondary | ICD-10-CM | POA: Diagnosis not present

## 2019-10-04 DIAGNOSIS — J849 Interstitial pulmonary disease, unspecified: Secondary | ICD-10-CM | POA: Diagnosis not present

## 2019-10-04 LAB — POCT I-STAT CREATININE: Creatinine, Ser: 2.1 mg/dL — ABNORMAL HIGH (ref 0.61–1.24)

## 2019-10-04 MED ORDER — IOHEXOL 350 MG/ML SOLN
100.0000 mL | Freq: Once | INTRAVENOUS | Status: AC | PRN
  Administered 2019-10-04: 80 mL via INTRAVENOUS

## 2019-10-17 ENCOUNTER — Encounter (INDEPENDENT_AMBULATORY_CARE_PROVIDER_SITE_OTHER): Payer: Self-pay | Admitting: *Deleted

## 2019-11-04 DIAGNOSIS — C44619 Basal cell carcinoma of skin of left upper limb, including shoulder: Secondary | ICD-10-CM | POA: Diagnosis not present

## 2019-11-04 DIAGNOSIS — D0339 Melanoma in situ of other parts of face: Secondary | ICD-10-CM | POA: Diagnosis not present

## 2019-11-04 DIAGNOSIS — D0422 Carcinoma in situ of skin of left ear and external auricular canal: Secondary | ICD-10-CM | POA: Diagnosis not present

## 2019-11-04 DIAGNOSIS — L570 Actinic keratosis: Secondary | ICD-10-CM | POA: Diagnosis not present

## 2019-11-04 DIAGNOSIS — D485 Neoplasm of uncertain behavior of skin: Secondary | ICD-10-CM | POA: Diagnosis not present

## 2019-11-06 ENCOUNTER — Ambulatory Visit (INDEPENDENT_AMBULATORY_CARE_PROVIDER_SITE_OTHER): Payer: Medicare PPO | Admitting: *Deleted

## 2019-11-06 ENCOUNTER — Other Ambulatory Visit: Payer: Self-pay

## 2019-11-06 DIAGNOSIS — I359 Nonrheumatic aortic valve disorder, unspecified: Secondary | ICD-10-CM | POA: Diagnosis not present

## 2019-11-06 DIAGNOSIS — Z952 Presence of prosthetic heart valve: Secondary | ICD-10-CM | POA: Diagnosis not present

## 2019-11-06 DIAGNOSIS — Z5181 Encounter for therapeutic drug level monitoring: Secondary | ICD-10-CM

## 2019-11-06 LAB — POCT INR: INR: 4.7 — AB (ref 2.0–3.0)

## 2019-11-06 NOTE — Patient Instructions (Signed)
Hold warfarin tonight then resume 1/2 tablet daily except none on Sundays Recheck in 3 weeks Continue greens 

## 2019-11-13 ENCOUNTER — Telehealth: Payer: Self-pay | Admitting: *Deleted

## 2019-11-13 NOTE — Telephone Encounter (Signed)
Former patient of [name].  I reviewed the chart.  I agree with your recommendations regarding hold on Coumadin for 2 days without Lovenox bridge pending melanoma excision.  As far as INR range is concerned with mechanical AVR, 2.0-3.0 is reasonable.  No clear history of stroke or valve thrombosis.

## 2019-11-13 NOTE — Telephone Encounter (Signed)
   [facility] Pre-operative Risk Assessment    HEARTCARE STAFF: - Please ensure there is not already an duplicate clearance open for this procedure. - Under Visit Info/Reason for Call, type in Other and utilize the format Clearance MM/DD/YY or Clearance TBD. Do not use dashes or single digits. - If request is for dental extraction, please clarify the # of teeth to be extracted.  Request for surgical clearance:  1. What type of surgery is being performed? EXCISION OF IN SITU MELANOMA  2. When is this surgery scheduled? 11/28/19   3. What type of clearance is required (medical clearance vs. Pharmacy clearance to hold med vs. Both)? PHARM  4. Are there any medications that need to be held prior to surgery and how long? COUMADIN TO BE HELD ON BOTH 10/5 AND 10/6 PRE PROCEDURE   5. Practice name and name of physician performing surgery? DR. Modena Nunnery  6. What is the office phone number? [phone]   7.   What is the office fax number? [phone]  8.   Anesthesia type (None, local, MAC, general) ? LOCAL   Julaine Hua 11/13/2019, 2:46 PM  _________________________________________________________________   (provider comments below)

## 2019-11-13 NOTE — Telephone Encounter (Signed)
Patient with diagnosis of mechanical AVR on warfarin for anticoagulation.    Procedure: melanoma excision Date of procedure: 11/28/19  Will defer to MD to clarify INR range. Typically, INR range is 2-3 for patients with mechanical AVR and no other risk factors. However, pt's INR range has been 2.5-3.5 since at least 2012 when we started using Epic. Based on pt's risk, he should be able to hold warfarin for the requested 2 days prior without requiring bridging with Lovenox.

## 2019-11-13 NOTE — Telephone Encounter (Signed)
Pharm can u address thank u

## 2019-11-14 DIAGNOSIS — Z23 Encounter for immunization: Secondary | ICD-10-CM | POA: Diagnosis not present

## 2019-11-28 ENCOUNTER — Ambulatory Visit (INDEPENDENT_AMBULATORY_CARE_PROVIDER_SITE_OTHER): Payer: Medicare PPO | Admitting: *Deleted

## 2019-11-28 DIAGNOSIS — Z952 Presence of prosthetic heart valve: Secondary | ICD-10-CM

## 2019-11-28 DIAGNOSIS — Z5181 Encounter for therapeutic drug level monitoring: Secondary | ICD-10-CM

## 2019-11-28 DIAGNOSIS — I359 Nonrheumatic aortic valve disorder, unspecified: Secondary | ICD-10-CM

## 2019-11-28 DIAGNOSIS — D0339 Melanoma in situ of other parts of face: Secondary | ICD-10-CM | POA: Diagnosis not present

## 2019-11-28 LAB — POCT INR: INR: 1.8 — AB (ref 2.0–3.0)

## 2019-11-28 NOTE — Patient Instructions (Signed)
Has been off warfarin x 2 days for excision of melanoma forehead today Restart warfarin tonight at 1/2 tablet daily except none on Sundays Recheck in 10 days Continue greens

## 2019-12-10 DIAGNOSIS — I10 Essential (primary) hypertension: Secondary | ICD-10-CM | POA: Diagnosis not present

## 2019-12-10 DIAGNOSIS — E785 Hyperlipidemia, unspecified: Secondary | ICD-10-CM | POA: Diagnosis not present

## 2019-12-10 DIAGNOSIS — I2510 Atherosclerotic heart disease of native coronary artery without angina pectoris: Secondary | ICD-10-CM | POA: Diagnosis not present

## 2019-12-10 DIAGNOSIS — R7301 Impaired fasting glucose: Secondary | ICD-10-CM | POA: Diagnosis not present

## 2019-12-10 DIAGNOSIS — N183 Chronic kidney disease, stage 3 unspecified: Secondary | ICD-10-CM | POA: Diagnosis not present

## 2019-12-10 DIAGNOSIS — Z79899 Other long term (current) drug therapy: Secondary | ICD-10-CM | POA: Diagnosis not present

## 2019-12-10 DIAGNOSIS — I6381 Other cerebral infarction due to occlusion or stenosis of small artery: Secondary | ICD-10-CM | POA: Diagnosis not present

## 2019-12-10 DIAGNOSIS — I700 Atherosclerosis of aorta: Secondary | ICD-10-CM | POA: Diagnosis not present

## 2019-12-12 ENCOUNTER — Other Ambulatory Visit: Payer: Self-pay

## 2019-12-12 ENCOUNTER — Ambulatory Visit (INDEPENDENT_AMBULATORY_CARE_PROVIDER_SITE_OTHER): Payer: Medicare PPO | Admitting: Pharmacist

## 2019-12-12 DIAGNOSIS — I359 Nonrheumatic aortic valve disorder, unspecified: Secondary | ICD-10-CM | POA: Diagnosis not present

## 2019-12-12 DIAGNOSIS — Z952 Presence of prosthetic heart valve: Secondary | ICD-10-CM | POA: Diagnosis not present

## 2019-12-12 DIAGNOSIS — C44619 Basal cell carcinoma of skin of left upper limb, including shoulder: Secondary | ICD-10-CM | POA: Diagnosis not present

## 2019-12-12 DIAGNOSIS — Z5181 Encounter for therapeutic drug level monitoring: Secondary | ICD-10-CM

## 2019-12-12 DIAGNOSIS — C44329 Squamous cell carcinoma of skin of other parts of face: Secondary | ICD-10-CM | POA: Diagnosis not present

## 2019-12-12 LAB — POCT INR: INR: 2.9 (ref 2.0–3.0)

## 2019-12-12 NOTE — Patient Instructions (Signed)
Description   Continue warfarin 1/2 tablet daily except none on Sundays Recheck in 4 weeks Continue greens

## 2019-12-17 ENCOUNTER — Encounter (INDEPENDENT_AMBULATORY_CARE_PROVIDER_SITE_OTHER): Payer: Self-pay | Admitting: *Deleted

## 2019-12-17 DIAGNOSIS — Z952 Presence of prosthetic heart valve: Secondary | ICD-10-CM | POA: Diagnosis not present

## 2019-12-17 DIAGNOSIS — I482 Chronic atrial fibrillation, unspecified: Secondary | ICD-10-CM | POA: Diagnosis not present

## 2019-12-17 DIAGNOSIS — E785 Hyperlipidemia, unspecified: Secondary | ICD-10-CM | POA: Diagnosis not present

## 2019-12-17 DIAGNOSIS — N1832 Chronic kidney disease, stage 3b: Secondary | ICD-10-CM | POA: Diagnosis not present

## 2019-12-17 DIAGNOSIS — Z0001 Encounter for general adult medical examination with abnormal findings: Secondary | ICD-10-CM | POA: Diagnosis not present

## 2019-12-17 DIAGNOSIS — I10 Essential (primary) hypertension: Secondary | ICD-10-CM | POA: Diagnosis not present

## 2019-12-17 DIAGNOSIS — I712 Thoracic aortic aneurysm, without rupture: Secondary | ICD-10-CM | POA: Diagnosis not present

## 2019-12-23 ENCOUNTER — Other Ambulatory Visit: Payer: Self-pay

## 2019-12-23 ENCOUNTER — Emergency Department (HOSPITAL_COMMUNITY): Payer: Medicare PPO

## 2019-12-23 ENCOUNTER — Encounter (HOSPITAL_COMMUNITY): Payer: Self-pay | Admitting: Emergency Medicine

## 2019-12-23 DIAGNOSIS — U071 COVID-19: Secondary | ICD-10-CM | POA: Diagnosis not present

## 2019-12-23 DIAGNOSIS — R059 Cough, unspecified: Secondary | ICD-10-CM | POA: Diagnosis not present

## 2019-12-23 DIAGNOSIS — J9811 Atelectasis: Secondary | ICD-10-CM | POA: Diagnosis not present

## 2019-12-23 DIAGNOSIS — I10 Essential (primary) hypertension: Secondary | ICD-10-CM | POA: Diagnosis not present

## 2019-12-23 DIAGNOSIS — R0602 Shortness of breath: Secondary | ICD-10-CM | POA: Diagnosis not present

## 2019-12-23 DIAGNOSIS — Z79899 Other long term (current) drug therapy: Secondary | ICD-10-CM | POA: Diagnosis not present

## 2019-12-23 DIAGNOSIS — Z87891 Personal history of nicotine dependence: Secondary | ICD-10-CM | POA: Diagnosis not present

## 2019-12-23 LAB — RESPIRATORY PANEL BY RT PCR (FLU A&B, COVID)
Influenza A by PCR: NEGATIVE
Influenza B by PCR: NEGATIVE
SARS Coronavirus 2 by RT PCR: POSITIVE — AB

## 2019-12-23 NOTE — ED Triage Notes (Signed)
Pt c/o chest congestion, SOB and cough since Wednesday. Pt had covid booster Tuesday.

## 2019-12-24 ENCOUNTER — Emergency Department (HOSPITAL_COMMUNITY)
Admission: EM | Admit: 2019-12-24 | Discharge: 2019-12-24 | Disposition: A | Discharge status: Home / Self Care | Payer: Medicare PPO | Attending: Emergency Medicine | Admitting: Emergency Medicine

## 2019-12-24 DIAGNOSIS — R0602 Shortness of breath: Secondary | ICD-10-CM

## 2019-12-24 DIAGNOSIS — U071 COVID-19: Secondary | ICD-10-CM

## 2019-12-24 LAB — CBC WITH DIFFERENTIAL/PLATELET
Abs Immature Granulocytes: 0.03 10*3/uL (ref 0.00–0.07)
Basophils Absolute: 0 10*3/uL (ref 0.0–0.1)
Basophils Relative: 0 %
Eosinophils Absolute: 0 10*3/uL (ref 0.0–0.5)
Eosinophils Relative: 0 %
HCT: 43 % (ref 39.0–52.0)
Hemoglobin: 14 g/dL (ref 13.0–17.0)
Immature Granulocytes: 0 %
Lymphocytes Relative: 22 %
Lymphs Abs: 1.8 10*3/uL (ref 0.7–4.0)
MCH: 28.7 pg (ref 26.0–34.0)
MCHC: 32.6 g/dL (ref 30.0–36.0)
MCV: 88.3 fL (ref 80.0–100.0)
Monocytes Absolute: 1.1 10*3/uL — ABNORMAL HIGH (ref 0.1–1.0)
Monocytes Relative: 13 %
Neutro Abs: 5.2 10*3/uL (ref 1.7–7.7)
Neutrophils Relative %: 65 %
Platelets: 170 10*3/uL (ref 150–400)
RBC: 4.87 MIL/uL (ref 4.22–5.81)
RDW: 14.2 % (ref 11.5–15.5)
WBC: 8.1 10*3/uL (ref 4.0–10.5)
nRBC: 0 % (ref 0.0–0.2)

## 2019-12-24 LAB — BASIC METABOLIC PANEL
Anion gap: 7 (ref 5–15)
BUN: 32 mg/dL — ABNORMAL HIGH (ref 8–23)
CO2: 23 mmol/L (ref 22–32)
Calcium: 8.4 mg/dL — ABNORMAL LOW (ref 8.9–10.3)
Chloride: 103 mmol/L (ref 98–111)
Creatinine, Ser: 2.26 mg/dL — ABNORMAL HIGH (ref 0.61–1.24)
GFR, Estimated: 30 mL/min — ABNORMAL LOW (ref >60)
Glucose, Bld: 111 mg/dL — ABNORMAL HIGH (ref 70–99)
Potassium: 4.3 mmol/L (ref 3.5–5.1)
Sodium: 133 mmol/L — ABNORMAL LOW (ref 135–145)

## 2019-12-24 MED ORDER — ALBUTEROL SULFATE HFA 108 (90 BASE) MCG/ACT IN AERS: 2.0000 | INHALATION_SPRAY | Freq: Once | RESPIRATORY_TRACT | Status: DC | PRN

## 2019-12-24 MED ORDER — EPINEPHRINE 0.3 MG/0.3ML IJ SOAJ: 0.3000 mg | Freq: Once | INTRAMUSCULAR | Status: DC | PRN

## 2019-12-24 MED ORDER — METHYLPREDNISOLONE SODIUM SUCC 125 MG IJ SOLR: 125.0000 mg | Freq: Once | INTRAMUSCULAR | Status: DC | PRN

## 2019-12-24 MED ORDER — FAMOTIDINE IN NACL 20-0.9 MG/50ML-% IV SOLN: 20.0000 mg | Freq: Once | INTRAVENOUS | Status: DC | PRN

## 2019-12-24 MED ORDER — BENZONATATE 100 MG PO CAPS
100.0000 mg | ORAL_CAPSULE | Freq: Three times a day (TID) | ORAL | 0 refills | Status: DC
Start: 2019-12-24 — End: 2020-03-12

## 2019-12-24 MED ORDER — SODIUM CHLORIDE 0.9 % IV SOLN: INTRAVENOUS | Status: DC | PRN

## 2019-12-24 MED ORDER — DIPHENHYDRAMINE HCL 50 MG/ML IJ SOLN: 50.0000 mg | Freq: Once | INTRAMUSCULAR | Status: DC | PRN

## 2019-12-24 MED ORDER — SODIUM CHLORIDE 0.9 % IV SOLN
1200.0000 mg | Freq: Once | INTRAVENOUS | Status: AC
  Administered 2019-12-24: 1200 mg via INTRAVENOUS
  Filled 2019-12-24: qty 10

## 2019-12-24 NOTE — ED Provider Notes (Signed)
[facility] EMERGENCY DEPARTMENT Provider Note   CSN: [id number] Arrival date & time: 12/23/19  1949     History Chief Complaint  Patient presents with  . Cough    Stephen Moses is a 74 y.o. male.  HPI     This is a 74 year old male with a history of ascending aortic aneurysm, hypertension, pancytopenia who presents with cough, congestion, shortness of breath.  Patient reports that he had his normal physical within the last week.  He reports he got a good bill of health.  He subsequently went and got his Covid booster.  Over the last several days he has had progressive nonproductive cough and shortness of breath.  He also reports chest congestion.  No known sick contacts or Covid exposures.  He reports that he takes significant precautions.  He does have a history of smoking.  No chest pain or lower extremity swelling.  No known fevers but does report chills.  Past Medical History:  Diagnosis Date  . Ascending aortic aneurysm ([place]) 2010    23-mm St. Jude mechanical valve conduit and re-implantation of the coronary arteries  . Bicuspid aortic valve   . Calculus of kidney   . Essential hypertension   . Long term current use of anticoagulant   . Normocytic anemia   . Pancytopenia ([place])   . Paraspinal muscle spasm     Patient Active Problem List   Diagnosis Date Noted  . Rectal bleeding 05/29/2016  . Diverticulitis 05/29/2016  . Normocytic anemia 05/18/2015  . Thrombocytopenia ([place]) 05/18/2015  . Leukocytosis 05/17/2015  . CAP (community acquired pneumonia) 03/30/2014  . Acute bronchitis 03/30/2014  . Essential hypertension 03/30/2014  . Acute kidney injury ([place]) 03/30/2014  . Lung density on x-ray 03/30/2014  . Encounter for therapeutic drug monitoring 03/28/2013  . Ascending aortic aneurysm ([place]) 09/16/2011  . Aortic valve disorder 05/30/2010  . Long term current use of anticoagulant 05/30/2010  . SYNCOPE 07/06/2009  . DIZZINESS 07/06/2009  . PALPITATIONS  07/06/2009  . H/O aortic valve replacement 07/06/2009  . HYPERTENSION 08/08/2008  . RENAL CALCULUS 08/08/2008    Past Surgical History:  Procedure Laterality Date  . BACK SURGERY  2009  . CARDIAC VALVE REPLACEMENT  2010  . COLONOSCOPY    . COLONOSCOPY N/A 10/16/2014   Dr. Laural Golden: two colon polyps (tubular adenomas), scattered diverticula at sigmoid, small hemorrhoids above and below dentate line, surveillance due in 2021.   [name] EYE SURGERY    . Left rotator cuff repair    . SHOULDER SURGERY Bilateral        Family History  Problem Relation Age of Onset  . Leukemia Brother   . Heart attack Brother   . Lung cancer Brother   . Colon cancer Brother        less than age 21.   [name] Leukemia Sister     Social History   Tobacco Use  . Smoking status: Former Smoker    Packs/day: 2.50    Years: 35.00    Pack years: 87.50    Types: Cigarettes    Start date: 11/22/1958    Quit date: 11/21/1993    Years since quitting: 26.1  . Smokeless tobacco: Never Used  Vaping Use  . Vaping Use: Never used  Substance Use Topics  . Alcohol use: Not Currently    Alcohol/week: 0.0 standard drinks    Comment: occasional beer  . Drug use: No    Home Medications Prior to Admission medications  Medication Sig Start Date End Date Taking? Authorizing Provider  acetaminophen (TYLENOL) 500 MG tablet Take 500 mg by mouth every 6 (six) hours as needed for mild pain or moderate pain.    [provider]  amLODipine (NORVASC) 5 MG tablet Take 5 mg by mouth at bedtime.     [provider]  atorvastatin (LIPITOR) 40 MG tablet TAKE ONE (1) TABLET BY MOUTH EVERY DAY 08/28/19   Verta Ellen., NP  benzonatate (TESSALON) 100 MG capsule Take 1 capsule (100 mg total) by mouth every 8 (eight) hours. 12/24/19   Raegan Winders, Barbette Hair, MD  cetirizine (ZYRTEC) 10 MG tablet Take 10 mg by mouth as needed for allergies.    [provider]  esomeprazole (NEXIUM) 40 MG capsule Take 40 mg by mouth  as needed.    [provider]  methocarbamol (ROBAXIN) 500 MG tablet Take 500 mg by mouth every 6 (six) hours as needed for muscle spasms.    [provider]  metoprolol succinate (TOPROL-XL) 25 MG 24 hr tablet Take 25 mg by mouth at bedtime.  06/06/12   Fay Records, MD  warfarin (COUMADIN) 10 MG tablet Take 1/2 tablet daily or as directed Patient taking differently: Take 5 mg by mouth daily. MANAGED BY LISA 03/19/14   Herminio Commons, MD    Allergies    Codeine  Review of Systems   Review of Systems  Constitutional: Negative for fever.  HENT: Positive for congestion.   Respiratory: Positive for cough and shortness of breath.   Cardiovascular: Negative for chest pain.  Gastrointestinal: Negative for abdominal pain, nausea and vomiting.  Genitourinary: Negative for dysuria.  All other systems reviewed and are negative.   Physical Exam Updated Vital Signs BP 124/82   Pulse 77   Temp 98.5 F (36.9 C)   Resp 17   SpO2 95%   Physical Exam Vitals and nursing note reviewed.  Constitutional:      Appearance: He is well-developed. He is not ill-appearing.     Comments: Elderly, nontoxic-appearing  HENT:     Head: Normocephalic and atraumatic.     Nose: Nose normal.     Mouth/Throat:     Mouth: Mucous membranes are moist.  Eyes:     Pupils: Pupils are equal, round, and reactive to light.  Cardiovascular:     Rate and Rhythm: Normal rate and regular rhythm.     Heart sounds: Normal heart sounds. No murmur heard.   Pulmonary:     Effort: Pulmonary effort is normal. No respiratory distress.     Breath sounds: Normal breath sounds. No wheezing.  Abdominal:     General: Bowel sounds are normal.     Palpations: Abdomen is soft.     Tenderness: There is no abdominal tenderness. There is no rebound.  Musculoskeletal:     Cervical back: Neck supple.     Right lower leg: No edema.     Left lower leg: No edema.  Lymphadenopathy:     Cervical: No cervical  adenopathy.  Skin:    General: Skin is warm and dry.  Neurological:     Mental Status: He is alert and oriented to person, place, and time.  Psychiatric:        Mood and Affect: Mood normal.     ED Results / Procedures / Treatments   Labs (all labs ordered are listed, but only abnormal results are displayed) Labs Reviewed  RESPIRATORY PANEL BY RT PCR (FLU A&B,  COVID) - Abnormal; Notable for the following components:      Result Value   SARS Coronavirus 2 by RT PCR POSITIVE (*)    All other components within normal limits  CBC WITH DIFFERENTIAL/PLATELET - Abnormal; Notable for the following components:   Monocytes Absolute 1.1 (*)    All other components within normal limits  BASIC METABOLIC PANEL - Abnormal; Notable for the following components:   Sodium 133 (*)    Glucose, Bld 111 (*)    BUN 32 (*)    Creatinine, Ser 2.26 (*)    Calcium 8.4 (*)    GFR, Estimated 30 (*)    All other components within normal limits    EKG EKG Interpretation  Date/Time:  Monday December 23 2019 20:14:44 EDT Ventricular Rate:  110 PR Interval:  172 QRS Duration: 80 QT Interval:  320 QTC Calculation: 433 R Axis:   -97 Text Interpretation: Sinus tachycardia Right superior axis deviation Abnormal ECG Since last tracing QT has shortened Otherwise no significant change Confirmed by Daleen Bo [phone]) on 12/23/2019 9:13:52 PM   Radiology DG Chest 2 View  Result Date: 12/23/2019 CLINICAL DATA:  Cough.  Chest congestion.  Shortness of breath. EXAM: CHEST - 2 VIEW COMPARISON:  Radiograph 03/19/2018.  CT 10/04/2019 FINDINGS: Post median sternotomy with prosthetic aortic valve. Stable heart size and mediastinal contours. Aortic atherosclerosis. Chronic and unchanged elevation of right hemidiaphragm. No pulmonary edema. No significant pleural effusion. Minimal subsegmental opacities at the left lung base. No pneumothorax. No acute osseous abnormalities are seen. IMPRESSION: Subsegmental opacities  at the left lung base, typical of atelectasis. Electronically Signed   By: Keith Rake M.D.   On: 12/23/2019 20:54    Procedures Procedures (including critical care time)  Medications Ordered in ED Medications  0.9 %  sodium chloride infusion (has no administration in time range)  diphenhydrAMINE (BENADRYL) injection 50 mg (has no administration in time range)  famotidine (PEPCID) IVPB 20 mg premix (has no administration in time range)  methylPREDNISolone sodium succinate (SOLU-MEDROL) 125 mg/2 mL injection 125 mg (has no administration in time range)  albuterol (VENTOLIN HFA) 108 (90 Base) MCG/ACT inhaler 2 puff (has no administration in time range)  EPINEPHrine (EPI-PEN) injection 0.3 mg (has no administration in time range)  casirivimab-imdevimab (REGEN-COV) 1,200 mg in sodium chloride 0.9 % 110 mL IVPB (0 mg Intravenous Stopped 12/24/19 0432)    ED Course  I have reviewed the triage vital signs and the nursing notes.  Pertinent labs & imaging results that were available during my care of the patient were reviewed by me and considered in my medical decision making (see chart for details).    MDM Rules/Calculators/A&P                          Patient presents with cough, congestion, shortness of breath.  He is overall nontoxic-appearing and vital signs are reassuring.  He has some mild tachycardia.  O2 sats 92 to 97% on room air.  He is fully vaccinated against COVID-19.  Chest x-ray shows no evidence of pneumothorax or pneumonia.  EKG was without acute ischemic or arrhythmic changes.  COVID-19 testing is positive.  Given that he is not hypoxic, will obtain some lab work and if at baseline, will treat with monoclonal antibodies as patient likely can be discharged.  Lab work obtained.  No significant leukocytosis.  He has creatinine of 2.6.  Most recent creatinine 2.1.  Otherwise at baseline.  Monoclonal antibody therapy ordered.  Patient tolerated this well.  He maintained his O2  sats.  We discussed quarantine for 10 days from onset of symptoms.  He is requesting something for his cough.  I sent Ladona Ridgel to his pharmacy.  After history, exam, and medical workup I feel the patient has been appropriately medically screened and is safe for discharge home. Pertinent diagnoses were discussed with the patient. Patient was given return precautions.  LENNIN OSMOND was evaluated in Emergency Department on 12/24/2019 for the symptoms described in the history of present illness. He was evaluated in the context of the global COVID-19 pandemic, which necessitated consideration that the patient might be at risk for infection with the SARS-CoV-2 virus that causes COVID-19. Institutional protocols and algorithms that pertain to the evaluation of patients at risk for COVID-19 are in a state of rapid change based on information released by regulatory bodies including the CDC and federal and state organizations. These policies and algorithms were followed during the patient's care in the ED.  Final Clinical Impression(s) / ED Diagnoses Final diagnoses:  COVID-19  SOB (shortness of breath)    Rx / DC Orders ED Discharge Orders         Ordered    benzonatate (TESSALON) 100 MG capsule  Every 8 hours        12/24/19 0544           Ridhima Golberg, Barbette Hair, MD 12/24/19 [phone]

## 2019-12-24 NOTE — Discharge Instructions (Addendum)
You were seen today and found to be positive for COVID-19.  You were given monoclonal antibodies.  You need to quarantine for 10 days from onset of symptoms.  If you have new or worsening symptoms you should be reevaluated.

## 2020-01-13 ENCOUNTER — Ambulatory Visit (INDEPENDENT_AMBULATORY_CARE_PROVIDER_SITE_OTHER): Payer: Medicare PPO | Admitting: *Deleted

## 2020-01-13 DIAGNOSIS — Z952 Presence of prosthetic heart valve: Secondary | ICD-10-CM

## 2020-01-13 DIAGNOSIS — Z5181 Encounter for therapeutic drug level monitoring: Secondary | ICD-10-CM

## 2020-01-13 DIAGNOSIS — I359 Nonrheumatic aortic valve disorder, unspecified: Secondary | ICD-10-CM | POA: Diagnosis not present

## 2020-01-13 LAB — POCT INR: INR: 3.9 — AB (ref 2.0–3.0)

## 2020-01-13 NOTE — Patient Instructions (Signed)
Hold warfarin tonight then resume 1/2 tablet daily except none on Sundays Recheck in 3 weeks Continue greens

## 2020-01-15 DIAGNOSIS — J1281 Pneumonia due to SARS-associated coronavirus: Secondary | ICD-10-CM | POA: Diagnosis not present

## 2020-02-05 NOTE — Progress Notes (Signed)
Cardiology Office Note  Date: 02/06/2020   ID: Silviano, Neuser [birth year], MRN [id number]  PCP:  [name], MD  Cardiologist:  Rozann Lesches, MD Electrophysiologist:  None   Chief Complaint  Patient presents with  . Cardiac follow-up    History of Present Illness: SOSTENES KAUFFMANN is a 74 y.o. male former patient of [name] now presenting to establish follow-up with me.  I reviewed his records and updated the chart.  He was last assessed via telehealth encounter in November 2020.  He presents today with no recurrent angina symptoms and stable dyspnea on exertion, NYHA class II with most activities.  Records indicate diagnosis of COVID-19 in early November, he had already been vaccinated.  He had relatively mild symptoms and did not require hospitalization.  Still has intermittent cough, no fevers or chills.  He did not require any specific therapy.  He is on Coumadin with follow-up in the anticoagulation clinic, recent INR 3.5.  No spontaneous bleeding problems are reported.  I reviewed his last echocardiogram report from 2019.  Mechanical AVR was functioning normally at that time with mean gradient 15 mmHg.  He has a stable proximal ascending aortic aneurysm at 4.7 cm by follow-up chest CT in August.  Dr. Willey Blade has been arranging follow-up CT imaging on a semiannual basis.  He is asymptomatic.  Past Medical History:  Diagnosis Date  . Ascending aortic aneurysm ([place]) 2010    23-mm St. Jude mechanical valve conduit and re-implantation of the coronary arteries  . Bicuspid aortic valve   . Calculus of kidney   . Essential hypertension   . Long term current use of anticoagulant   . Normocytic anemia   . Pancytopenia ([place])   . Paraspinal muscle spasm     Past Surgical History:  Procedure Laterality Date  . BACK SURGERY  2009  . CARDIAC VALVE REPLACEMENT  2010  . COLONOSCOPY    . COLONOSCOPY N/A 10/16/2014   Dr. Laural Golden: two colon polyps (tubular adenomas),  scattered diverticula at sigmoid, small hemorrhoids above and below dentate line, surveillance due in 2021.   [name] EYE SURGERY    . Left rotator cuff repair    . SHOULDER SURGERY Bilateral     Current Outpatient Medications  Medication Sig Dispense Refill  . acetaminophen (TYLENOL) 500 MG tablet Take 500 mg by mouth every 6 (six) hours as needed for mild pain or moderate pain.    [name] amLODipine (NORVASC) 5 MG tablet Take 5 mg by mouth at bedtime.     [name] atorvastatin (LIPITOR) 40 MG tablet TAKE ONE (1) TABLET BY MOUTH EVERY DAY 90 tablet 2  . benzonatate (TESSALON) 100 MG capsule Take 1 capsule (100 mg total) by mouth every 8 (eight) hours. 21 capsule 0  . cetirizine (ZYRTEC) 10 MG tablet Take 10 mg by mouth as needed for allergies.    [name] esomeprazole (NEXIUM) 40 MG capsule Take 40 mg by mouth as needed.    . methocarbamol (ROBAXIN) 500 MG tablet Take 500 mg by mouth every 6 (six) hours as needed for muscle spasms.    . metoprolol succinate (TOPROL-XL) 25 MG 24 hr tablet Take 25 mg by mouth at bedtime.     [name] warfarin (COUMADIN) 5 MG tablet Take 1 tablet daily except 1/2 tablet on Sundays, Tuesdays and Thursdays or as directed 90 tablet 4   No current facility-administered medications for this visit.   Allergies:  Codeine   ROS:  No syncope.  Physical Exam: VS:  BP 138/90   Pulse 81   Ht 5\' 8"  (1.727 m)   Wt 206 lb 9.6 oz (93.7 kg)   SpO2 97%   BMI 31.41 kg/m , BMI Body mass index is 31.41 kg/m.  Wt Readings from Last 3 Encounters:  02/06/20 206 lb 9.6 oz (93.7 kg)  05/17/19 204 lb 12.8 oz (92.9 kg)  01/21/19 200 lb (90.7 kg)    General: Elderly male, appears comfortable at rest. HEENT: Conjunctiva and lids normal, wearing a mask. Neck: Supple, no elevated JVP or carotid bruits, no thyromegaly. Lungs: Clear to auscultation, nonlabored breathing at rest. Cardiac: Regular rate and rhythm, no S3, mechanical click in S2 with 2/6 systolic murmur. Abdomen: Soft, nontender, bowel sounds  present. Extremities: No pitting edema.  ECG:  An ECG dated 12/23/2019 was personally reviewed today and demonstrated:  Sinus tachycardia with rightward axis.  Recent Labwork: 12/24/2019: BUN 32; Creatinine, Ser 2.26; Hemoglobin 14.0; Platelets 170; Potassium 4.3; Sodium 133  October 2021: BUN 19, creatinine 1.94, potassium 5.2, AST 26, ALT 22, hemoglobin 14.6, platelets 231, cholesterol 139, triglycerides 112, HDL 49, LDL 70  Other Studies Reviewed Today:  Echocardiogram 02/07/2018: - Left ventricle: The cavity size was normal. Wall thickness was  increased in a pattern of moderate LVH. Systolic function was  normal. The estimated ejection fraction was in the range of 55%  to 60%. Wall motion was normal; there were no regional wall  motion abnormalities. Doppler parameters are consistent with  abnormal left ventricular relaxation (grade 1 diastolic  dysfunction).  - Aortic valve: There is a 23 mm St Jude mechanical valve is  present in the AV position. Mildly calcified annulus. Mildly  thickened leaflets. There was no stenosis. There was no  significant regurgitation. Mean gradient (S): 15 mm Hg.  - Mitral valve: Mildly calcified annulus. Mildly thickened leaflets.   Chest CTA 10/04/2019: IMPRESSION: 1. Stable proximal transverse aortic aneurysm of 4.7 cm. Recommend semi-annual imaging followup by CTA or MRA and referral to cardiothoracic surgery if not already obtained. This recommendation follows 2010 ACCF/AHA/AATS/ACR/ASA/SCA/SCAI/SIR/STS/SVM Guidelines for the Diagnosis and Management of Patients With Thoracic Aortic Disease. Circulation. 2010; 121: F573-U20. Aortic aneurysm NOS (ICD10-I71.9) 2. Aortic atherosclerosis.  No acute aortic findings. 3. Emphysema. Interstitial lung disease previously characterized on high-resolution chest CT.  Aortic Atherosclerosis (ICD10-I70.0) and Emphysema (ICD10-J43.9).  Assessment and Plan:  1.  History of bicuspid  aortic valve status post mechanical AVR in 2010 with valve conduit and reimplantation of coronaries.  Echocardiogram from 2019 showed stable prosthetic function with mean gradient 15 mmHg.  He continues on Coumadin with follow-up in the anticoagulation clinic.  2.  Stable proximal ascending aortic aneurysm at 4.7 cm by CTA in August.  He is asymptomatic.  Dr. Willey Blade has been arranging semiannual imaging.  3.  Mixed hyperlipidemia, on Lipitor.  LDL was 70 in October.  4.  Essential hypertension, currently on Norvasc and Toprol-XL.  No changes made in current regimen.  Keep follow-up with Dr. Willey Blade.  Medication Adjustments/Labs and Tests Ordered: Current medicines are reviewed at length with the patient today.  Concerns regarding medicines are outlined above.   Tests Ordered: No orders of the defined types were placed in this encounter.   Medication Changes: No orders of the defined types were placed in this encounter.   Disposition:  Follow up 1 year in the [place] office.  Signed, Satira Sark, MD, [facility] 02/06/2020 8:40 AM    [place] [facility]  [place] [address] [place], [place] [address] Phone: [phone]; Fax: [phone]

## 2020-02-06 ENCOUNTER — Other Ambulatory Visit: Payer: Self-pay

## 2020-02-06 ENCOUNTER — Encounter: Payer: Self-pay | Admitting: Cardiology

## 2020-02-06 ENCOUNTER — Ambulatory Visit (INDEPENDENT_AMBULATORY_CARE_PROVIDER_SITE_OTHER): Payer: Medicare PPO | Admitting: *Deleted

## 2020-02-06 ENCOUNTER — Ambulatory Visit (INDEPENDENT_AMBULATORY_CARE_PROVIDER_SITE_OTHER): Payer: Medicare PPO | Admitting: Cardiology

## 2020-02-06 VITALS — BP 138/90 | HR 81 | Ht 68.0 in | Wt 206.6 lb

## 2020-02-06 DIAGNOSIS — I10 Essential (primary) hypertension: Secondary | ICD-10-CM | POA: Diagnosis not present

## 2020-02-06 DIAGNOSIS — Z5181 Encounter for therapeutic drug level monitoring: Secondary | ICD-10-CM

## 2020-02-06 DIAGNOSIS — I359 Nonrheumatic aortic valve disorder, unspecified: Secondary | ICD-10-CM

## 2020-02-06 DIAGNOSIS — I7121 Aneurysm of the ascending aorta, without rupture: Secondary | ICD-10-CM

## 2020-02-06 DIAGNOSIS — E782 Mixed hyperlipidemia: Secondary | ICD-10-CM | POA: Diagnosis not present

## 2020-02-06 DIAGNOSIS — Z952 Presence of prosthetic heart valve: Secondary | ICD-10-CM

## 2020-02-06 DIAGNOSIS — I712 Thoracic aortic aneurysm, without rupture: Secondary | ICD-10-CM | POA: Diagnosis not present

## 2020-02-06 LAB — POCT INR: INR: 3.5 — AB (ref 2.0–3.0)

## 2020-02-06 MED ORDER — WARFARIN SODIUM 5 MG PO TABS: ORAL_TABLET | ORAL | 4 refills | Status: DC

## 2020-02-06 NOTE — Patient Instructions (Signed)
Medication Instructions:  Your physician recommends that you continue on your current medications as directed. Please refer to the Current Medication list given to you today.  *If you need a refill on your cardiac medications before your next appointment, please call your pharmacy*   Lab Work: None today If you have labs (blood work) drawn today and your tests are completely normal, you will receive your results only by: . MyChart Message (if you have MyChart) OR . A paper copy in the mail If you have any lab test that is abnormal or we need to change your treatment, we will call you to review the results.   Testing/Procedures: None today   Follow-Up: At [facility], you and your health needs are our priority.  As part of our continuing mission to provide you with exceptional heart care, we have created designated Provider Care Teams.  These Care Teams include your primary Cardiologist (physician) and Advanced Practice Providers (APPs -  Physician Assistants and Nurse Practitioners) who all work together to provide you with the care you need, when you need it.  We recommend signing up for the patient portal called "MyChart".  Sign up information is provided on this After Visit Summary.  MyChart is used to connect with patients for Virtual Visits (Telemedicine).  Patients are able to view lab/test results, encounter notes, upcoming appointments, etc.  Non-urgent messages can be sent to your provider as well.   To learn more about what you can do with MyChart, go to [email or web address].    Your next appointment:   12 month(s)  The format for your next appointment:   In Person  Provider:   Samuel McDowell, MD   Other Instructions None       Thank you for choosing  [facility] !         

## 2020-02-06 NOTE — Patient Instructions (Signed)
CHANGED TABLET FROM 10MG  TO 5MG  Hold warfarin tonight then decrease dose to 1 tablet daily except 1/2 tablet on Sundays, Tuesdays and Thursdays Recheck in 3 weeks Continue greens

## 2020-02-25 ENCOUNTER — Other Ambulatory Visit (INDEPENDENT_AMBULATORY_CARE_PROVIDER_SITE_OTHER): Payer: Self-pay

## 2020-02-25 ENCOUNTER — Telehealth (INDEPENDENT_AMBULATORY_CARE_PROVIDER_SITE_OTHER): Payer: Self-pay

## 2020-02-25 ENCOUNTER — Encounter (INDEPENDENT_AMBULATORY_CARE_PROVIDER_SITE_OTHER): Payer: Self-pay

## 2020-02-25 DIAGNOSIS — Z86010 Personal history of colonic polyps: Secondary | ICD-10-CM

## 2020-02-25 DIAGNOSIS — Z800 Family history of malignant neoplasm of digestive organs: Secondary | ICD-10-CM

## 2020-02-25 DIAGNOSIS — Z1211 Encounter for screening for malignant neoplasm of colon: Secondary | ICD-10-CM

## 2020-02-25 MED ORDER — NA SULFATE-K SULFATE-MG SULF 17.5-3.13-1.6 GM/177ML PO SOLN: 354.0000 mL | Freq: Once | ORAL | 0 refills | Status: AC

## 2020-02-25 NOTE — Telephone Encounter (Signed)
Stephen Moses, CMA  

## 2020-02-25 NOTE — Telephone Encounter (Signed)
Mr Umland is scheduled for a Colonoscopy on 03/12/20  With Dr Karilyn Cota and will he be able to stop warfarin 5 days prior to procedure, please advise

## 2020-02-27 ENCOUNTER — Ambulatory Visit (INDEPENDENT_AMBULATORY_CARE_PROVIDER_SITE_OTHER): Payer: Medicare PPO | Admitting: *Deleted

## 2020-02-27 DIAGNOSIS — I359 Nonrheumatic aortic valve disorder, unspecified: Secondary | ICD-10-CM

## 2020-02-27 DIAGNOSIS — Z5181 Encounter for therapeutic drug level monitoring: Secondary | ICD-10-CM | POA: Diagnosis not present

## 2020-02-27 DIAGNOSIS — Z952 Presence of prosthetic heart valve: Secondary | ICD-10-CM

## 2020-02-27 LAB — POCT INR: INR: 2.1 (ref 2.0–3.0)

## 2020-02-27 NOTE — Patient Instructions (Signed)
CHANGED TABLET FROM 10MG  TO 5MG  Continue 1 tablet daily except 1/2 tablet on Sundays, Tuesdays and Thursdays Scheduled for colonoscopy on 1/20.  Will hold warfarin 5 days before procedure.  Take last dose of warfarin on 1/14 and resume night of procedure.  No lovenox bridge needed.   Recheck in 3 weeks Continue greens

## 2020-03-11 ENCOUNTER — Other Ambulatory Visit (HOSPITAL_COMMUNITY)
Admission: RE | Admit: 2020-03-11 | Discharge: 2020-03-11 | Disposition: A | Discharge status: Home / Self Care | Payer: Medicare PPO | Source: Ambulatory Visit | Attending: Internal Medicine | Admitting: Internal Medicine

## 2020-03-11 ENCOUNTER — Other Ambulatory Visit: Payer: Self-pay

## 2020-03-11 DIAGNOSIS — Z01812 Encounter for preprocedural laboratory examination: Secondary | ICD-10-CM | POA: Diagnosis not present

## 2020-03-11 DIAGNOSIS — Z20822 Contact with and (suspected) exposure to covid-19: Secondary | ICD-10-CM | POA: Diagnosis not present

## 2020-03-11 LAB — SARS CORONAVIRUS 2 (TAT 6-24 HRS): SARS Coronavirus 2: NEGATIVE

## 2020-03-12 ENCOUNTER — Encounter (HOSPITAL_COMMUNITY)
Admission: RE | Disposition: A | Discharge status: Home / Self Care | Payer: Self-pay | Source: Home / Self Care | Attending: Internal Medicine

## 2020-03-12 ENCOUNTER — Ambulatory Visit (HOSPITAL_COMMUNITY)
Admission: RE | Admit: 2020-03-12 | Discharge: 2020-03-12 | Disposition: A | Discharge status: Home / Self Care | Payer: Medicare PPO | Attending: Internal Medicine | Admitting: Internal Medicine

## 2020-03-12 ENCOUNTER — Other Ambulatory Visit: Payer: Self-pay

## 2020-03-12 DIAGNOSIS — Z1211 Encounter for screening for malignant neoplasm of colon: Secondary | ICD-10-CM | POA: Insufficient documentation

## 2020-03-12 DIAGNOSIS — Z87891 Personal history of nicotine dependence: Secondary | ICD-10-CM | POA: Diagnosis not present

## 2020-03-12 DIAGNOSIS — K5730 Diverticulosis of large intestine without perforation or abscess without bleeding: Secondary | ICD-10-CM | POA: Insufficient documentation

## 2020-03-12 DIAGNOSIS — K648 Other hemorrhoids: Secondary | ICD-10-CM | POA: Insufficient documentation

## 2020-03-12 DIAGNOSIS — Z800 Family history of malignant neoplasm of digestive organs: Secondary | ICD-10-CM | POA: Insufficient documentation

## 2020-03-12 DIAGNOSIS — Z79899 Other long term (current) drug therapy: Secondary | ICD-10-CM | POA: Diagnosis not present

## 2020-03-12 DIAGNOSIS — D120 Benign neoplasm of cecum: Secondary | ICD-10-CM | POA: Diagnosis not present

## 2020-03-12 DIAGNOSIS — Z885 Allergy status to narcotic agent status: Secondary | ICD-10-CM | POA: Insufficient documentation

## 2020-03-12 DIAGNOSIS — Z86010 Personal history of colonic polyps: Secondary | ICD-10-CM | POA: Diagnosis not present

## 2020-03-12 DIAGNOSIS — Z09 Encounter for follow-up examination after completed treatment for conditions other than malignant neoplasm: Secondary | ICD-10-CM | POA: Diagnosis not present

## 2020-03-12 DIAGNOSIS — D123 Benign neoplasm of transverse colon: Secondary | ICD-10-CM | POA: Insufficient documentation

## 2020-03-12 DIAGNOSIS — K644 Residual hemorrhoidal skin tags: Secondary | ICD-10-CM | POA: Diagnosis not present

## 2020-03-12 DIAGNOSIS — D121 Benign neoplasm of appendix: Secondary | ICD-10-CM | POA: Diagnosis not present

## 2020-03-12 HISTORY — PX: POLYPECTOMY: SHX5525

## 2020-03-12 HISTORY — PX: COLONOSCOPY: SHX5424

## 2020-03-12 LAB — HM COLONOSCOPY

## 2020-03-12 SURGERY — COLONOSCOPY
Anesthesia: Moderate Sedation

## 2020-03-12 MED ORDER — STERILE WATER FOR IRRIGATION IR SOLN
Status: DC | PRN
  Administered 2020-03-12: 1.5 mL

## 2020-03-12 MED ORDER — MEPERIDINE HCL 50 MG/ML IJ SOLN
INTRAMUSCULAR | Status: DC | PRN
  Administered 2020-03-12 (×2): 25 mg via INTRAVENOUS

## 2020-03-12 MED ORDER — MEPERIDINE HCL 50 MG/ML IJ SOLN
INTRAMUSCULAR | Status: AC
  Filled 2020-03-12: qty 1

## 2020-03-12 MED ORDER — MIDAZOLAM HCL 5 MG/5ML IJ SOLN
INTRAMUSCULAR | Status: DC | PRN
  Administered 2020-03-12 (×2): 2 mg via INTRAVENOUS
  Administered 2020-03-12 (×2): 1 mg via INTRAVENOUS

## 2020-03-12 MED ORDER — MIDAZOLAM HCL 5 MG/5ML IJ SOLN
INTRAMUSCULAR | Status: AC
  Filled 2020-03-12: qty 10

## 2020-03-12 MED ORDER — SODIUM CHLORIDE 0.9 % IV SOLN: INTRAVENOUS | Status: DC

## 2020-03-12 NOTE — Discharge Instructions (Signed)
Colonoscopy, Adult, Care After This sheet gives you information about how to care for yourself after your procedure. Your doctor may also give you more specific instructions. If you have problems or questions, call your doctor. What can I expect after the procedure? After the procedure, it is common to have:  A small amount of blood in your poop (stool) for 24 hours.  Some gas.  Mild cramping or bloating in your belly (abdomen). Follow these instructions at home: Eating and drinking  Drink enough fluid to keep your pee (urine) pale yellow.  Follow instructions from your doctor about what you cannot eat or drink.  Return to your normal diet as told by your doctor. Avoid heavy or fried foods that are hard to digest.   Activity  Rest as told by your doctor.  Do not sit for a long time without moving. Get up to take short walks every 1-2 hours. This is important. Ask for help if you feel weak or unsteady.  Return to your normal activities as told by your doctor. Ask your doctor what activities are safe for you. To help cramping and bloating:  Try walking around.  Put heat on your belly as told by your doctor. Use the heat source that your doctor recommends, such as a moist heat pack or a heating pad. ? Put a towel between your skin and the heat source. ? Leave the heat on for 20-30 minutes. ? Remove the heat if your skin turns bright red. This is very important if you are unable to feel pain, heat, or cold. You may have a greater risk of getting burned.   General instructions  If you were given a medicine to help you relax (sedative) during your procedure, it can affect you for many hours. Do not drive or use machinery until your doctor says that it is safe.  For the first 24 hours after the procedure: ? Do not sign important documents. ? Do not drink alcohol. ? Do your daily activities more slowly than normal. ? Eat foods that are soft and easy to digest.  Take  over-the-counter or prescription medicines only as told by your doctor.  Keep all follow-up visits as told by your doctor. This is important. Contact a doctor if:  You have blood in your poop 2-3 days after the procedure. Get help right away if:  You have more than a small amount of blood in your poop.  You see large clumps of tissue (blood clots) in your poop.  Your belly is swollen.  You feel like you may vomit (nauseous).  You vomit.  You have a fever.  You have belly pain that gets worse, and medicine does not help your pain. Summary  After the procedure, it is common to have a small amount of blood in your poop. You may also have mild cramping and bloating in your belly.  If you were given a medicine to help you relax (sedative) during your procedure, it can affect you for many hours. Do not drive or use machinery until your doctor says that it is safe.  Get help right away if you have a lot of blood in your poop, feel like you may vomit, have a fever, or have more belly pain. This information is not intended to replace advice given to you by your health care provider. Make sure you discuss any questions you have with your health care provider. Document Revised: 12/14/2018 Document Reviewed: 09/03/2018 Elsevier Patient Education  2021  Elsevier [employer] Colon Polyps  Colon polyps are tissue growths inside the colon, which is part of the large intestine. They are one of the types of polyps that can grow in the body. A polyp may be a round bump or a mushroom-shaped growth. You could have one polyp or more than one. Most colon polyps are noncancerous (benign). However, some colon polyps can become cancerous over time. Finding and removing the polyps early can help prevent this. What are the causes? The exact cause of colon polyps is not known. What increases the risk? The following factors may make you more likely to develop this condition:  Having a family history of colorectal  cancer or colon polyps.  Being older than 74 years of age.  Being younger than 74 years of age and having a significant family history of colorectal cancer or colon polyps or a genetic condition that puts you at higher risk of getting colon polyps.  Having inflammatory bowel disease, such as ulcerative colitis or Crohn's disease.  Having certain conditions passed from parent to child (hereditary conditions), such as: ? Familial adenomatous polyposis (FAP). ? Lynch syndrome. ? Turcot syndrome. ? Peutz-Jeghers syndrome. ? MUTYH-associated polyposis (MAP).  Being overweight.  Certain lifestyle factors. These include smoking cigarettes, drinking too much alcohol, not getting enough exercise, and eating a diet that is high in fat and red meat and low in fiber.  Having had childhood cancer that was treated with radiation of the abdomen. What are the signs or symptoms? Many times, there are no symptoms. If you have symptoms, they may include:  Blood coming from the rectum during a bowel movement.  Blood in the stool (feces). The blood may be bright red or very dark in color.  Pain in the abdomen.  A change in bowel habits, such as constipation or diarrhea. How is this diagnosed? This condition is diagnosed with a colonoscopy. This is a procedure in which a lighted, flexible scope is inserted into the opening between the buttocks (anus) and then passed into the colon to examine the area. Polyps are sometimes found when a colonoscopy is done as part of routine cancer screening tests. How is this treated? This condition is treated by removing any polyps that are found. Most polyps can be removed during a colonoscopy. Those polyps will then be tested for cancer. Additional treatment may be needed depending on the results of testing. Follow these instructions at home: Eating and drinking  Eat foods that are high in fiber, such as fruits, vegetables, and whole grains.  Eat foods that are  high in calcium and vitamin D, such as milk, cheese, yogurt, eggs, liver, fish, and broccoli.  Limit foods that are high in fat, such as fried foods and desserts.  Limit the amount of red meat, precooked or cured meat, or other processed meat that you eat, such as hot dogs, sausages, bacon, or meat loaves.  Limit sugary drinks.   Lifestyle  Maintain a healthy weight, or lose weight if recommended by your health care provider.  Exercise every day or as told by your health care provider.  Do not use any products that contain nicotine or tobacco, such as cigarettes, e-cigarettes, and chewing tobacco. If you need help quitting, ask your health care provider.  Do not drink alcohol if: ? Your health care provider tells you not to drink. ? You are pregnant, may be pregnant, or are planning to become pregnant.  If you drink alcohol: ? Limit how much you  use to:  0-1 drink a day for women.  0-2 drinks a day for men. ? Know how much alcohol is in your drink. In the [place] one drink equals one 12 oz bottle of beer (355 mL), one 5 oz glass of wine (148 mL), or one 1 oz glass of hard liquor (44 mL). General instructions  Take over-the-counter and prescription medicines only as told by your health care provider.  Keep all follow-up visits. This is important. This includes having regularly scheduled colonoscopies. Talk to your health care provider about when you need a colonoscopy. Contact a health care provider if:  You have new or worsening bleeding during a bowel movement.  You have new or increased blood in your stool.  You have a change in bowel habits.  You lose weight for no known reason. Summary  Colon polyps are tissue growths inside the colon, which is part of the large intestine. They are one type of polyp that can grow in the body.  Most colon polyps are noncancerous (benign), but some can become cancerous over time.  This condition is diagnosed with a colonoscopy.  This  condition is treated by removing any polyps that are found. Most polyps can be removed during a colonoscopy. This information is not intended to replace advice given to you by your health care provider. Make sure you discuss any questions you have with your health care provider. Document Revised: 05/29/2019 Document Reviewed: 05/29/2019 Elsevier Patient Education  2021 [place]. High-Fiber Eating Plan Fiber, also called dietary fiber, is a type of carbohydrate. It is found foods such as fruits, vegetables, whole grains, and beans. A high-fiber diet can have many health benefits. Your health care provider may recommend a high-fiber diet to help:  Prevent constipation. Fiber can make your bowel movements more regular.  Lower your cholesterol.  Relieve the following conditions: ? Inflammation of veins in the anus (hemorrhoids). ? Inflammation of specific areas of the digestive tract (uncomplicated diverticulosis). ? A problem of the large intestine, also called the colon, that sometimes causes pain and diarrhea (irritable bowel syndrome, or IBS).  Prevent overeating as part of a weight-loss plan.  Prevent heart disease, type 2 diabetes, and certain cancers. What are tips for following this plan? Reading food labels  Check the nutrition facts label on food products for the amount of dietary fiber. Choose foods that have 5 grams of fiber or more per serving.  The goals for recommended daily fiber intake include: ? Men (age 51 or younger): 34-38 g. ? Men (over age 54): 28-34 g. ? Women (age 36 or younger): 25-28 g. ? Women (over age 54): 22-25 g. Your daily fiber goal is _____________ g.   Shopping  Choose whole fruits and vegetables instead of processed forms, such as apple juice or applesauce.  Choose a wide variety of high-fiber foods such as avocados, lentils, oats, and kidney beans.  Read the nutrition facts label of the foods you choose. Be aware of foods with added fiber.  These foods often have high sugar and sodium amounts per serving. Cooking  Use whole-grain flour for baking and cooking.  Cook with brown rice instead of white rice. Meal planning  Start the day with a breakfast that is high in fiber, such as a cereal that contains 5 g of fiber or more per serving.  Eat breads and cereals that are made with whole-grain flour instead of refined flour or white flour.  Eat brown rice, bulgur wheat, or millet  instead of white rice.  Use beans in place of meat in soups, salads, and pasta dishes.  Be sure that half of the grains you eat each day are whole grains. General information  You can get the recommended daily intake of dietary fiber by: ? Eating a variety of fruits, vegetables, grains, nuts, and beans. ? Taking a fiber supplement if you are not able to take in enough fiber in your diet. It is better to get fiber through food than from a supplement.  Gradually increase how much fiber you consume. If you increase your intake of dietary fiber too quickly, you may have bloating, cramping, or gas.  Drink plenty of water to help you digest fiber.  Choose high-fiber snacks, such as berries, raw vegetables, nuts, and popcorn. What foods should I eat? Fruits Berries. Pears. Apples. Oranges. Avocado. Prunes and raisins. Dried figs. Vegetables Sweet potatoes. Spinach. Kale. Artichokes. Cabbage. Broccoli. Cauliflower. Green peas. Carrots. Squash. Grains Whole-grain breads. Multigrain cereal. Oats and oatmeal. Brown rice. Barley. Bulgur wheat. [place]. Quinoa. Bran muffins. Popcorn. Rye wafer crackers. Meats and other proteins Navy beans, kidney beans, and pinto beans. Soybeans. Split peas. Lentils. Nuts and seeds. Dairy Fiber-fortified yogurt. Beverages Fiber-fortified soy milk. Fiber-fortified orange juice. Other foods Fiber bars. The items listed above may not be a complete list of recommended foods and beverages. Contact a dietitian for more  information. What foods should I avoid? Fruits Fruit juice. Cooked, strained fruit. Vegetables Fried potatoes. Canned vegetables. Well-cooked vegetables. Grains White bread. Pasta made with refined flour. White rice. Meats and other proteins Fatty cuts of meat. Fried chicken or fried fish. Dairy Milk. Yogurt. Cream cheese. Sour cream. Fats and oils Butters. Beverages Soft drinks. Other foods Cakes and pastries. The items listed above may not be a complete list of foods and beverages to avoid. Talk with your dietitian about what choices are best for you. Summary  Fiber is a type of carbohydrate. It is found in foods such as fruits, vegetables, whole grains, and beans.  A high-fiber diet has many benefits. It can help to prevent constipation, lower blood cholesterol, aid weight loss, and reduce your risk of heart disease, diabetes, and certain cancers.  Increase your intake of fiber gradually. Increasing fiber too quickly may cause cramping, bloating, and gas. Drink plenty of water while you increase the amount of fiber you consume.  The best sources of fiber include whole fruits and vegetables, whole grains, nuts, seeds, and beans. This information is not intended to replace advice given to you by your health care provider. Make sure you discuss any questions you have with your health care provider. Document Revised: 06/13/2019 Document Reviewed: 06/13/2019 Elsevier Patient Education  2021 [place].   Resume warfarin today at usual dose.  INR to be checked in 1 week or so as recommended by cardiology. Can take Tylenol for headache or musculoskeletal pain but do not take OTC NSAIDs or aspirin Resume scheduled medications as before. High-fiber diet No driving for 24 hours. Physician will call with biopsy results.

## 2020-03-12 NOTE — Op Note (Signed)
[facility] Patient Name: Stephen Moses Procedure Date: 03/12/2020 8:01 AM MRN: [id number] Date of Birth: [birth year] Attending MD: Hildred Laser , MD CSN: [id number] Age: 75 Admit Type: Outpatient Procedure:                Colonoscopy Indications:              High risk colon cancer surveillance: Personal                            history of colonic polyps Providers:                Hildred Laser, MD, Otis Peak B. Sharon Seller, RN, Raphael Gibney, Technician Referring MD:             [name], MD Medicines:                Meperidine 50 mg IV, Midazolam 6 mg IV Complications:            No immediate complications. Estimated Blood Loss:     Estimated blood loss was minimal. Procedure:                Pre-Anesthesia Assessment:                           - Prior to the procedure, a History and Physical                            was performed, and patient medications and                            allergies were reviewed. The patient's tolerance of                            previous anesthesia was also reviewed. The risks                            and benefits of the procedure and the sedation                            options and risks were discussed with the patient.                            All questions were answered, and informed consent                            was obtained. Prior Anticoagulants: The patient                            last took Coumadin (warfarin) 5 days prior to the                            procedure. ASA Grade Assessment: III - A patient  with severe systemic disease. After reviewing the                            risks and benefits, the patient was deemed in                            satisfactory condition to undergo the procedure.                           After obtaining informed consent, the colonoscope                            was passed under direct vision. Throughout the                             procedure, the patient's blood pressure, pulse, and                            oxygen saturations were monitored continuously. The                            PCF-[id number]) scope was introduced through                            the anus and advanced to the the cecum, identified                            by appendiceal orifice and ileocecal valve. The                            colonoscopy was performed without difficulty. The                            patient tolerated the procedure well. The quality                            of the bowel preparation was adequate. Scope In: 8:33:24 AM Scope Out: 9:14:02 AM Scope Withdrawal Time: 0 hours 33 minutes 12 seconds  Total Procedure Duration: 0 hours 40 minutes 38 seconds  Findings:      The perianal and digital rectal examinations were normal.      A 12 mm polyp was found in the appendiceal orifice. The polyp was       semi-pedunculated. The polyp was removed with a hot snare. Resection and       retrieval were complete. The pathology specimen was placed into Bottle       Number 1.      Four polyps were found in the transverse colon. The polyps were 4 to 6       mm in size. These polyps were removed with a cold snare. Resection was       complete, but the polyp tissue was only partially retrieved. The       pathology specimen was placed into Bottle Number 2.      A small polyp was found in the transverse colon. Biopsies were taken  with a cold forceps for histology. The pathology specimen was placed       into Bottle Number 2.      Two diverticula were found in the sigmoid colon.      External and internal hemorrhoids were found during retroflexion. The       hemorrhoids were medium-sized. Impression:               - One 12 mm polyp at the appendiceal orifice,                            removed with a hot snare. Resected and retrieved.                           - Four 4 to 6 mm polyps in the transverse colon,                             removed with a cold snare. Complete resection.                            Partial retrieval (three polyps were retrieved).                           - One small polyp in the transverse colon. Biopsied.                           - Diverticulosis in the sigmoid colon.                           - External and internal hemorrhoids. Moderate Sedation:      Moderate (conscious) sedation was administered by the endoscopy nurse       and supervised by the endoscopist. The following parameters were       monitored: oxygen saturation, heart rate, blood pressure, CO2       capnography and response to care. Total physician intraservice time was       45 minutes. Recommendation:           - Patient has a contact number available for                            emergencies. The signs and symptoms of potential                            delayed complications were discussed with the                            patient. Return to normal activities tomorrow.                            Written discharge instructions were provided to the                            patient.                           - High fiber diet today.                           -  Continue present medications.                           - Resume Coumadin (warfarin) at prior dose today.                            Refer to [facility] for further adjustment of                            therapy.                           - Await pathology results.                           - Repeat colonoscopy is recommended. The                            colonoscopy date will be determined after pathology                            results from today's exam become available for                            review. Procedure Code(s):        --- Professional ---                           [phone], Colonoscopy, flexible; with removal of                            tumor(s), polyp(s), or other lesion(s) by snare                            technique                            [id number], 40, Colonoscopy, flexible; with biopsy,                            single or multiple Diagnosis Code(s):        --- Professional ---                           K63.5, Polyp of colon                           Z86.010, Personal history of colonic polyps                           K64.8, Other hemorrhoids                           K57.30, Diverticulosis of large intestine without                            perforation or abscess without bleeding CPT copyright 2019 American Medical Association.  All rights reserved. The codes documented in this report are preliminary and upon coder review may  be revised to meet current compliance requirements. Hildred Laser, MD Hildred Laser, MD 03/12/2020 9:37:31 AM This report has been signed electronically. Number of Addenda: 0

## 2020-03-12 NOTE — H&P (Signed)
Stephen Moses is an 75 y.o. male.   Chief Complaint: Patient is here for colonoscopy HPI: Patient is 75 year old Caucasian male who has a history of colonic adenomas who is here for surveillance colonoscopy.  His last exam was in August 2016 with removal of 2 tubular adenomas.  He denies abdominal pain change in bowel habits or rectal bleeding. Family history is significant for colon carcinoma in his brother who died of metastatic disease at 59. Warfarin is on hold  Past Medical History:  Diagnosis Date  . Ascending aortic aneurysm ([place]) 2010    23-mm [employer] mechanical valve conduit and re-implantation of the coronary arteries  . Bicuspid aortic valve   . Calculus of kidney   . Essential hypertension   . Long term current use of anticoagulant   . Normocytic anemia   . Pancytopenia ([place])   . Paraspinal muscle spasm     Past Surgical History:  Procedure Laterality Date  . BACK SURGERY  2009  . CARDIAC VALVE REPLACEMENT  2010  . COLONOSCOPY    . COLONOSCOPY N/A 10/16/2014   Dr. Laural Golden: two colon polyps (tubular adenomas), scattered diverticula at sigmoid, small hemorrhoids above and below dentate line, surveillance due in 2021.   [name] EYE SURGERY    . Left rotator cuff repair    . SHOULDER SURGERY Bilateral     Family History  Problem Relation Age of Onset  . Leukemia Brother   . Heart attack Brother   . Lung cancer Brother   . Colon cancer Brother        less than age 59.   [name] Leukemia Sister    Social History:  reports that he quit smoking about 26 years ago. His smoking use included cigarettes. He started smoking about 61 years ago. He has a 87.50 pack-year smoking history. He has never used smokeless tobacco. He reports previous alcohol use. He reports that he does not use drugs.  Allergies:  Allergies  Allergen Reactions  . Codeine Nausea And Vomiting and Palpitations    Medications Prior to Admission  Medication Sig Dispense Refill  . acetaminophen (TYLENOL) 500  MG tablet Take 1,000 mg by mouth every 6 (six) hours as needed for mild pain or moderate pain.    [name] amLODipine (NORVASC) 5 MG tablet Take 5 mg by mouth daily in the afternoon.    . Ascorbic Acid (VITAMIN C) 1000 MG tablet Take 1,000 mg by mouth daily.    [name] atorvastatin (LIPITOR) 40 MG tablet TAKE ONE (1) TABLET BY MOUTH EVERY DAY (Patient taking differently: Take 40 mg by mouth daily.) 90 tablet 2  . Cholecalciferol (VITAMIN D3) 50 MCG (2000 UT) TABS Take 2,000 Units by mouth daily.    [name] esomeprazole (NEXIUM) 40 MG capsule Take 40 mg by mouth daily as needed (acid reflux/indigestion.).    [name] hydroxypropyl methylcellulose / hypromellose (ISOPTO TEARS / GONIOVISC) 2.5 % ophthalmic solution Place 1-2 drops into both eyes 3 (three) times daily as needed for dry eyes.    . methocarbamol (ROBAXIN) 500 MG tablet Take 500 mg by mouth every 6 (six) hours as needed for muscle spasms.    . metoprolol succinate (TOPROL-XL) 25 MG 24 hr tablet Take 25 mg by mouth at bedtime.     [name] warfarin (COUMADIN) 5 MG tablet Take 1 tablet daily except 1/2 tablet on Sundays, Tuesdays and Thursdays or as directed (Patient taking differently: Take 2.5-5 mg by mouth See admin instructions. Take 0.5 tablet (2.5 mg) by  mouth on Sundays, Tuesdays, and Thursdays in the evenings. Take 1 tablet (5 mg) by mouth on Mondays, Wednesdays, Fridays, & Saturdays in the evenings.) 90 tablet 4  . benzonatate (TESSALON) 100 MG capsule Take 1 capsule (100 mg total) by mouth every 8 (eight) hours. (Patient not taking: Reported on 03/06/2020) 21 capsule 0    Results for orders placed or performed during the hospital encounter of 03/11/20 (from the past 48 hour(s))  SARS CORONAVIRUS 2 (TAT 6-24 HRS) Nasopharyngeal Nasopharyngeal Swab     Status: None   Collection Time: 03/11/20  8:20 AM   Specimen: Nasopharyngeal Swab  Result Value Ref Range   SARS Coronavirus 2 NEGATIVE NEGATIVE    Comment: (NOTE) SARS-CoV-2 target nucleic acids are NOT  DETECTED.  The SARS-CoV-2 RNA is generally detectable in upper and lower respiratory specimens during the acute phase of infection. Negative results do not preclude SARS-CoV-2 infection, do not rule out co-infections with other pathogens, and should not be used as the sole basis for treatment or other patient management decisions. Negative results must be combined with clinical observations, patient history, and epidemiological information. The expected result is Negative.  Fact Sheet for Patients: SugarRoll.be  Fact Sheet for Healthcare Providers: https://www.woods-mathews.com/  This test is not yet approved or cleared by the [place] FDA and  has been authorized for detection and/or diagnosis of SARS-CoV-2 by FDA under an Emergency Use Authorization (EUA). This EUA will remain  in effect (meaning this test can be used) for the duration of the COVID-19 declaration under Se ction 564(b)(1) of the Act, 21 U.S.C. section 360bbb-3(b)(1), unless the authorization is terminated or revoked sooner.  Performed at [facility], [place] [address], [place], [place] [address]    No results found.  Review of Systems  Blood pressure 140/73, temperature (!) 97.5 F (36.4 C), temperature source Oral, resp. rate 20, height 5\' 8"  (1.727 m), weight 90.7 kg, SpO2 98 %. Physical Exam HENT:     Mouth/Throat:     Mouth: Mucous membranes are moist.     Pharynx: Oropharynx is clear.  Eyes:     General: No scleral icterus.    Conjunctiva/sclera: Conjunctivae normal.  Cardiovascular:     Comments: Cardiac exam with regular rhythm normal S1 loud snapping S2.  No murmur gallop noted Pulmonary:     Effort: Pulmonary effort is normal.     Breath sounds: Normal breath sounds.  Abdominal:     General: There is no distension.     Palpations: Abdomen is soft. There is no mass.     Tenderness: There is no abdominal tenderness.  Musculoskeletal:         General: No swelling.     Cervical back: Neck supple.  Lymphadenopathy:     Cervical: No cervical adenopathy.  Skin:    General: Skin is warm and dry.  Neurological:     Mental Status: He is alert.      Assessment/Plan  History of colonic adenomas. Family history of CRC in first-degree relative. Surveillance colonoscopy.  Hildred Laser, MD 03/12/2020, 8:18 AM

## 2020-03-13 LAB — SURGICAL PATHOLOGY

## 2020-03-16 ENCOUNTER — Encounter (HOSPITAL_COMMUNITY): Payer: Self-pay | Admitting: Internal Medicine

## 2020-03-18 ENCOUNTER — Encounter (INDEPENDENT_AMBULATORY_CARE_PROVIDER_SITE_OTHER): Payer: Self-pay | Admitting: *Deleted

## 2020-03-23 ENCOUNTER — Ambulatory Visit (INDEPENDENT_AMBULATORY_CARE_PROVIDER_SITE_OTHER): Payer: Medicare PPO | Admitting: *Deleted

## 2020-03-23 DIAGNOSIS — Z5181 Encounter for therapeutic drug level monitoring: Secondary | ICD-10-CM | POA: Diagnosis not present

## 2020-03-23 DIAGNOSIS — Z952 Presence of prosthetic heart valve: Secondary | ICD-10-CM | POA: Diagnosis not present

## 2020-03-23 LAB — POCT INR: INR: 2.1 (ref 2.0–3.0)

## 2020-03-23 NOTE — Patient Instructions (Signed)
CHANGED TABLET FROM 10MG  TO 5MG  Continue 1 tablet daily except 1/2 tablet on Sundays, Tuesdays and Thursdays Recheck in 4 weeks Continue greens

## 2020-04-09 ENCOUNTER — Other Ambulatory Visit (HOSPITAL_COMMUNITY): Payer: Self-pay | Admitting: Internal Medicine

## 2020-04-09 ENCOUNTER — Other Ambulatory Visit: Payer: Self-pay | Admitting: Internal Medicine

## 2020-04-09 DIAGNOSIS — I700 Atherosclerosis of aorta: Secondary | ICD-10-CM

## 2020-04-18 DIAGNOSIS — I10 Essential (primary) hypertension: Secondary | ICD-10-CM | POA: Diagnosis not present

## 2020-04-18 DIAGNOSIS — M1990 Unspecified osteoarthritis, unspecified site: Secondary | ICD-10-CM | POA: Diagnosis not present

## 2020-04-18 DIAGNOSIS — E669 Obesity, unspecified: Secondary | ICD-10-CM | POA: Diagnosis not present

## 2020-04-18 DIAGNOSIS — I739 Peripheral vascular disease, unspecified: Secondary | ICD-10-CM | POA: Diagnosis not present

## 2020-04-18 DIAGNOSIS — Z6831 Body mass index (BMI) 31.0-31.9, adult: Secondary | ICD-10-CM | POA: Diagnosis not present

## 2020-04-18 DIAGNOSIS — G8929 Other chronic pain: Secondary | ICD-10-CM | POA: Diagnosis not present

## 2020-04-18 DIAGNOSIS — K219 Gastro-esophageal reflux disease without esophagitis: Secondary | ICD-10-CM | POA: Diagnosis not present

## 2020-04-18 DIAGNOSIS — E785 Hyperlipidemia, unspecified: Secondary | ICD-10-CM | POA: Diagnosis not present

## 2020-04-18 DIAGNOSIS — K08109 Complete loss of teeth, unspecified cause, unspecified class: Secondary | ICD-10-CM | POA: Diagnosis not present

## 2020-04-20 ENCOUNTER — Ambulatory Visit (HOSPITAL_COMMUNITY)
Admission: RE | Admit: 2020-04-20 | Discharge: 2020-04-20 | Disposition: A | Discharge status: Home / Self Care | Payer: Medicare PPO | Source: Ambulatory Visit | Attending: Internal Medicine | Admitting: Internal Medicine

## 2020-04-20 ENCOUNTER — Ambulatory Visit (INDEPENDENT_AMBULATORY_CARE_PROVIDER_SITE_OTHER): Payer: Medicare PPO | Admitting: *Deleted

## 2020-04-20 ENCOUNTER — Other Ambulatory Visit: Payer: Self-pay

## 2020-04-20 DIAGNOSIS — I712 Thoracic aortic aneurysm, without rupture: Secondary | ICD-10-CM | POA: Diagnosis not present

## 2020-04-20 DIAGNOSIS — Z5181 Encounter for therapeutic drug level monitoring: Secondary | ICD-10-CM

## 2020-04-20 DIAGNOSIS — I700 Atherosclerosis of aorta: Secondary | ICD-10-CM | POA: Diagnosis not present

## 2020-04-20 DIAGNOSIS — Z952 Presence of prosthetic heart valve: Secondary | ICD-10-CM

## 2020-04-20 DIAGNOSIS — I2510 Atherosclerotic heart disease of native coronary artery without angina pectoris: Secondary | ICD-10-CM | POA: Diagnosis not present

## 2020-04-20 DIAGNOSIS — J984 Other disorders of lung: Secondary | ICD-10-CM | POA: Diagnosis not present

## 2020-04-20 DIAGNOSIS — J438 Other emphysema: Secondary | ICD-10-CM | POA: Diagnosis not present

## 2020-04-20 LAB — POCT I-STAT CREATININE: Creatinine, Ser: 1.9 mg/dL — ABNORMAL HIGH (ref 0.61–1.24)

## 2020-04-20 LAB — POCT INR: INR: 3.4 — AB (ref 2.0–3.0)

## 2020-04-20 MED ORDER — IOHEXOL 350 MG/ML SOLN
75.0000 mL | Freq: Once | INTRAVENOUS | Status: AC | PRN
  Administered 2020-04-20: 75 mL via INTRAVENOUS

## 2020-04-20 NOTE — Patient Instructions (Signed)
CHANGED TABLET FROM 10MG  TO 5MG  Hold warfarin tonight then resume 1 tablet daily except 1/2 tablet on Sundays, Tuesdays and Thursdays Recheck in 3 weeks Continue greens

## 2020-05-04 DIAGNOSIS — L309 Dermatitis, unspecified: Secondary | ICD-10-CM | POA: Diagnosis not present

## 2020-05-04 DIAGNOSIS — L570 Actinic keratosis: Secondary | ICD-10-CM | POA: Diagnosis not present

## 2020-05-04 DIAGNOSIS — Z85828 Personal history of other malignant neoplasm of skin: Secondary | ICD-10-CM | POA: Diagnosis not present

## 2020-05-13 ENCOUNTER — Institutional Professional Consult (permissible substitution): Payer: Medicare PPO | Admitting: Surgery

## 2020-05-13 ENCOUNTER — Encounter: Payer: Self-pay | Admitting: Surgery

## 2020-05-13 ENCOUNTER — Other Ambulatory Visit: Payer: Self-pay

## 2020-05-13 VITALS — BP 148/87 | HR 87 | Resp 20 | Ht 68.0 in

## 2020-05-13 DIAGNOSIS — I7121 Aneurysm of the ascending aorta, without rupture: Secondary | ICD-10-CM

## 2020-05-13 DIAGNOSIS — I712 Thoracic aortic aneurysm, without rupture: Secondary | ICD-10-CM | POA: Diagnosis not present

## 2020-05-13 NOTE — Progress Notes (Signed)
Cardiothoracic Surgery Consultation   PCP is [name], MD Referring Provider is [name], MD  Chief Complaint  Patient presents with  . Thoracic Aortic Aneurysm    Surgical consult, CTA Chest 03/31/20    HPI:  The patient is a 75 year old gentleman with a history of hypertension, bicuspid aortic valve disease, and ascending aortic aneurysm who underwent Bentall procedure using a 23 mm St. Jude mechanical valved graft by me in 2010.  The aorta was placed up to the origin of the innominate artery using hypothermic circulatory arrest.  He had an uneventful course and has done well over the years on Coumadin with his INR followed in the anticoagulation clinic.  He has had some enlargement of his aortic arch beyond the ascending graft and CTA of the chest in August 2021 showed it to be 4.7 cm in greatest diameter.  This was measured at 4.5 cm on CTA of the chest in April 2017.  His most recent CTA of the chest from 04/20/2020 was felt that showed a slight increase in the maximum diameter of the proximal aortic arch to 4.8 to 4.9 cm.  The distal arch measured 3 cm and the descending aorta measured 2.6 to 2.9 cm.  Past Medical History:  Diagnosis Date  . Ascending aortic aneurysm ([place]) 2010    23-mm St. Jude mechanical valve conduit and re-implantation of the coronary arteries  . Bicuspid aortic valve   . Calculus of kidney   . Essential hypertension   . Long term current use of anticoagulant   . Normocytic anemia   . Pancytopenia ([place])   . Paraspinal muscle spasm     Past Surgical History:  Procedure Laterality Date  . BACK SURGERY  2009  . CARDIAC VALVE REPLACEMENT  2010  . COLONOSCOPY    . COLONOSCOPY N/A 10/16/2014   Dr. Laural Golden: two colon polyps (tubular adenomas), scattered diverticula at sigmoid, small hemorrhoids above and below dentate line, surveillance due in 2021.   [name] COLONOSCOPY N/A 03/12/2020   Procedure: COLONOSCOPY;  Surgeon: Rogene Houston, MD;  [facility];  Service: Endoscopy;  Laterality: N/A;  8:00  . EYE SURGERY    . Left rotator cuff repair    . POLYPECTOMY  03/12/2020   Procedure: POLYPECTOMY;  Surgeon: Rogene Houston, MD;  Location: [facility];  Service: Endoscopy;;  . SHOULDER SURGERY Bilateral     Family History  Problem Relation Age of Onset  . Leukemia Brother   . Heart attack Brother   . Lung cancer Brother   . Colon cancer Brother        less than age 34.   [name] Leukemia Sister     Social History Social History   Tobacco Use  . Smoking status: Former Smoker    Packs/day: 2.50    Years: 35.00    Pack years: 87.50    Types: Cigarettes    Start date: 11/22/1958    Quit date: 11/21/1993    Years since quitting: 26.4  . Smokeless tobacco: Never Used  Vaping Use  . Vaping Use: Never used  Substance Use Topics  . Alcohol use: Not Currently    Alcohol/week: 0.0 standard drinks    Comment: occasional beer  . Drug use: No    Current Outpatient Medications  Medication Sig Dispense Refill  . acetaminophen (TYLENOL) 500 MG tablet Take 1,000 mg by mouth every 6 (six) hours as needed for mild pain or moderate pain.    [name]  amLODipine (NORVASC) 5 MG tablet Take 5 mg by mouth daily in the afternoon.    [name] atorvastatin (LIPITOR) 40 MG tablet TAKE ONE (1) TABLET BY MOUTH EVERY DAY (Patient taking differently: Take 40 mg by mouth daily.) 90 tablet 2  . esomeprazole (NEXIUM) 40 MG capsule Take 40 mg by mouth daily as needed (acid reflux/indigestion.).    [name] metoprolol succinate (TOPROL-XL) 25 MG 24 hr tablet Take 25 mg by mouth at bedtime.     [name] warfarin (COUMADIN) 5 MG tablet Take 1 tablet daily except 1/2 tablet on Sundays, Tuesdays and Thursdays or as directed (Patient taking differently: Take 2.5-5 mg by mouth See admin instructions. Take 0.5 tablet (2.5 mg) by mouth on Sundays, Tuesdays, and Thursdays in the evenings. Take 1 tablet (5 mg) by mouth on Mondays, Wednesdays, Fridays, & Saturdays in the evenings.) 90 tablet 4   . Ascorbic Acid (VITAMIN C) 1000 MG tablet Take 1,000 mg by mouth daily.    . Cholecalciferol (VITAMIN D3) 50 MCG (2000 UT) TABS Take 2,000 Units by mouth daily.    . hydroxypropyl methylcellulose / hypromellose (ISOPTO TEARS / GONIOVISC) 2.5 % ophthalmic solution Place 1-2 drops into both eyes 3 (three) times daily as needed for dry eyes.    . methocarbamol (ROBAXIN) 500 MG tablet Take 500 mg by mouth every 6 (six) hours as needed for muscle spasms.     No current facility-administered medications for this visit.    Allergies  Allergen Reactions  . Codeine Nausea And Vomiting and Palpitations    Review of Systems  Constitutional: Negative.   HENT: Positive for hearing loss.        Wears dentures  Eyes: Negative.   Respiratory: Positive for shortness of breath.   Cardiovascular: Negative.   Gastrointestinal: Negative.   Endocrine: Negative.   Genitourinary:       Kidney stones  Musculoskeletal: Positive for arthralgias.  Allergic/Immunologic: Negative.   Neurological: Negative.   Hematological: Bruises/bleeds easily.  Psychiatric/Behavioral: Negative.     BP (!) 148/87 (BP Location: Left Arm, Patient Position: Sitting)   Pulse 87   Resp 20   Ht 5\' 8"  (1.727 m)   SpO2 94% Comment: RA  BMI 30.41 kg/m  Physical Exam Constitutional:      Appearance: Normal appearance. He is normal weight.  HENT:     Head: Normocephalic and atraumatic.  Eyes:     Extraocular Movements: Extraocular movements intact.     Conjunctiva/sclera: Conjunctivae normal.     Pupils: Pupils are equal, round, and reactive to light.  Neck:     Vascular: No carotid bruit.     Comments: Crisp mechanical valve click Cardiovascular:     Rate and Rhythm: Normal rate and regular rhythm.     Pulses: Normal pulses.     Heart sounds: No murmur heard.   Pulmonary:     Effort: Pulmonary effort is normal.     Breath sounds: Normal breath sounds.  Abdominal:     General: Abdomen is flat.     Palpations:  Abdomen is soft.  Musculoskeletal:        General: No swelling. Normal range of motion.     Cervical back: Normal range of motion.  Skin:    General: Skin is warm and dry.  Neurological:     General: No focal deficit present.     Mental Status: He is alert and oriented to person, place, and time.  Psychiatric:        Mood  and Affect: Mood normal.        Behavior: Behavior normal.      Diagnostic Tests:  Narrative & Impression  CLINICAL DATA:  Follow-up of aneurysmal disease of the native aortic arch. Status post prior aortic valve replacement and replacement of ascending thoracic aorta in 2010.  EXAM: CT ANGIOGRAPHY CHEST WITH CONTRAST  TECHNIQUE: Multidetector CT imaging of the chest was performed using the standard protocol during bolus administration of intravenous contrast. Multiplanar CT image reconstructions and MIPs were obtained to evaluate the vascular anatomy.  CONTRAST:  2mL OMNIPAQUE IOHEXOL 350 MG/ML SOLN  COMPARISON:  10/04/2019 and additional prior studies.  FINDINGS: Cardiovascular: Prosthetic aortic valve and ascending aortic graft appears stable in appearance without evidence of pseudoaneurysm. Just beyond the distal anastomosis of the aortic graft, aneurysmal disease of the proximal aortic arch is again noted. Maximal diameter appears likely to have increased slightly since prior imaging with measured maximal diameter currently of approximately 4.8-4.9 cm compared to 4.7 cm on the prior study. By unenhanced CT, the proximal arch measured approximately 4.4 cm in 2016.  The distal arch measures 3 cm and the descending thoracic aorta measures approximately 2.6-2.9 cm. There is no evidence of aortic dissection. Visualized proximal great vessels demonstrate normal patency and branching anatomy. The heart size is within normal limits. No pericardial fluid identified. Central pulmonary arteries are normal in caliber. Mild amount of calcified  coronary artery plaque again visualized.  Mediastinum/Nodes: No enlarged mediastinal, hilar, or axillary lymph nodes. Thyroid gland, trachea, and esophagus demonstrate no significant findings.  Lungs/Pleura: Stable emphysematous lung disease and scattered pulmonary scarring as well as bilateral pulmonary bullae. There is no evidence of pulmonary edema, consolidation, pneumothorax, nodule or pleural fluid.  Upper Abdomen: No acute abnormality.  Musculoskeletal: No chest wall abnormality. No acute or significant osseous findings.  Review of the MIP images confirms the above findings.  IMPRESSION: 1. Stable appearance of prosthetic aortic valve and ascending aortic graft without evidence of pseudoaneurysm. 2. Aneurysmal disease of the proximal aortic arch is again noted. Maximal diameter appears to have increased slightly since prior imaging with measured maximal diameter currently of approximately 4.8-4.9 cm compared to 4.7 cm on the prior study. By unenhanced CT, the proximal arch measured approximately 4.4 cm in 2016. Recommend semi-annual imaging followup by CTA or MRA and referral to cardiothoracic surgery if not already obtained. This recommendation follows 2010 ACCF/AHA/AATS/ACR/ASA/SCA/SCAI/SIR/STS/SVM Guidelines for the Diagnosis and Management of Patients With Thoracic Aortic Disease. Circulation. 2010; 121: J856-D14. Aortic aneurysm NOS (ICD10-I71.9) 3. Stable emphysematous lung disease and scattered pulmonary scarring as well as bilateral pulmonary bullae.  Emphysema (ICD10-J43.9).   Electronically Signed   By: Aletta Edouard M.D.   On: 04/20/2020 13:54   Impression:  This 75 year old gentleman has history of bicuspid aortic valve disease with ascending aortic aneurysm treated with Bentall procedure in 2010.  He has known enlargement of the aortic arch which had a diameter of 4.2 cm at the innominate artery level at the time of his initial surgery.   Subsequent CT scans have shown some further enlargement of the aortic arch with the proximal arch measuring 4.5 cm in 2017, 4.7 cm 1 year ago and reportedly 4.8 to 4.9 cm on his current CT scan.  I have personally reviewed his current CT scan as well as the one from last year and his scans dating back to 2010.  I do not see any significant change in the dimension of the aortic arch at this time compared  to 1 year ago and really no significant difference dating back to 2017.  There probably has been slight enlargement compared to his preoperative scan in 2010.  His dimensions are still below the surgical threshold of 5.5 cm and only warrant continued follow-up at this time.  I reviewed the CT images with the patient and answered all of his questions.  I stressed the importance of continued good blood pressure control in preventing further enlargement and acute aortic dissection.  I recommended that he have a follow-up CTA of the chest in 1 year.  I do not think there is benefit to doing this in 6 months since it is very unlikely that we would see enough change to alter our plan of continued follow-up.  He is in agreement with that.   Plan:  I will see him back in 1 year with a CTA of the chest.  I spent 30 minutes performing this consultation and > 50% of this time was spent face to face counseling and coordinating the care of this patient's aortic arch aneurysm   Gaye Pollack, MD [facility] [phone]

## 2020-05-14 ENCOUNTER — Ambulatory Visit (INDEPENDENT_AMBULATORY_CARE_PROVIDER_SITE_OTHER): Payer: Medicare PPO | Admitting: Pharmacist

## 2020-05-14 DIAGNOSIS — I359 Nonrheumatic aortic valve disorder, unspecified: Secondary | ICD-10-CM | POA: Diagnosis not present

## 2020-05-14 DIAGNOSIS — Z5181 Encounter for therapeutic drug level monitoring: Secondary | ICD-10-CM

## 2020-05-14 DIAGNOSIS — Z952 Presence of prosthetic heart valve: Secondary | ICD-10-CM

## 2020-05-14 LAB — POCT INR: INR: 2.3 (ref 2.0–3.0)

## 2020-05-14 NOTE — Patient Instructions (Signed)
Description   CHANGED TABLET FROM 10MG  TO 5MG  Hold warfarin tonight then resume 1 tablet daily except 1/2 tablet on Sundays, Tuesdays and Thursdays Recheck in 4 weeks Continue greens

## 2020-05-28 DIAGNOSIS — H524 Presbyopia: Secondary | ICD-10-CM | POA: Diagnosis not present

## 2020-06-09 DIAGNOSIS — J8410 Pulmonary fibrosis, unspecified: Secondary | ICD-10-CM | POA: Diagnosis not present

## 2020-06-09 DIAGNOSIS — C4910 Malignant neoplasm of connective and soft tissue of unspecified upper limb, including shoulder: Secondary | ICD-10-CM | POA: Diagnosis not present

## 2020-06-09 DIAGNOSIS — I482 Chronic atrial fibrillation, unspecified: Secondary | ICD-10-CM | POA: Diagnosis not present

## 2020-06-09 DIAGNOSIS — Z952 Presence of prosthetic heart valve: Secondary | ICD-10-CM | POA: Diagnosis not present

## 2020-06-09 DIAGNOSIS — N1832 Chronic kidney disease, stage 3b: Secondary | ICD-10-CM | POA: Diagnosis not present

## 2020-06-09 DIAGNOSIS — I10 Essential (primary) hypertension: Secondary | ICD-10-CM | POA: Diagnosis not present

## 2020-06-11 ENCOUNTER — Other Ambulatory Visit: Payer: Self-pay

## 2020-06-11 ENCOUNTER — Ambulatory Visit (INDEPENDENT_AMBULATORY_CARE_PROVIDER_SITE_OTHER): Payer: Medicare PPO | Admitting: *Deleted

## 2020-06-11 DIAGNOSIS — Z5181 Encounter for therapeutic drug level monitoring: Secondary | ICD-10-CM | POA: Diagnosis not present

## 2020-06-11 DIAGNOSIS — Z952 Presence of prosthetic heart valve: Secondary | ICD-10-CM

## 2020-06-11 LAB — POCT INR: INR: 2.9 (ref 2.0–3.0)

## 2020-06-11 NOTE — Patient Instructions (Signed)
CHANGED TABLET FROM 10MG  TO 5MG  Continue warfarin 1 tablet daily except 1/2 tablet on Sundays, Tuesdays and Thursdays Recheck in 4 weeks Continue greens

## 2020-06-15 DIAGNOSIS — I711 Thoracic aortic aneurysm, ruptured: Secondary | ICD-10-CM | POA: Diagnosis not present

## 2020-06-15 DIAGNOSIS — N1832 Chronic kidney disease, stage 3b: Secondary | ICD-10-CM | POA: Diagnosis not present

## 2020-07-16 ENCOUNTER — Ambulatory Visit (INDEPENDENT_AMBULATORY_CARE_PROVIDER_SITE_OTHER): Payer: Medicare PPO | Admitting: *Deleted

## 2020-07-16 DIAGNOSIS — Z5181 Encounter for therapeutic drug level monitoring: Secondary | ICD-10-CM | POA: Diagnosis not present

## 2020-07-16 DIAGNOSIS — Z952 Presence of prosthetic heart valve: Secondary | ICD-10-CM | POA: Diagnosis not present

## 2020-07-16 LAB — POCT INR: INR: 3 (ref 2.0–3.0)

## 2020-07-16 NOTE — Patient Instructions (Signed)
CHANGED TABLET FROM 10MG  TO 5MG  Continue warfarin 1 tablet daily except 1/2 tablet on Sundays, Tuesdays and Thursdays Recheck in 6 weeks Continue greens

## 2020-07-28 DIAGNOSIS — N485 Ulcer of penis: Secondary | ICD-10-CM | POA: Diagnosis not present

## 2020-07-28 DIAGNOSIS — D239 Other benign neoplasm of skin, unspecified: Secondary | ICD-10-CM | POA: Diagnosis not present

## 2020-07-28 DIAGNOSIS — L570 Actinic keratosis: Secondary | ICD-10-CM | POA: Diagnosis not present

## 2020-07-28 DIAGNOSIS — D485 Neoplasm of uncertain behavior of skin: Secondary | ICD-10-CM | POA: Diagnosis not present

## 2020-07-28 DIAGNOSIS — C4441 Basal cell carcinoma of skin of scalp and neck: Secondary | ICD-10-CM | POA: Diagnosis not present

## 2020-08-04 ENCOUNTER — Other Ambulatory Visit: Payer: Self-pay | Admitting: Family Medicine

## 2020-08-06 DIAGNOSIS — C4441 Basal cell carcinoma of skin of scalp and neck: Secondary | ICD-10-CM | POA: Diagnosis not present

## 2020-08-11 DIAGNOSIS — L0100 Impetigo, unspecified: Secondary | ICD-10-CM | POA: Diagnosis not present

## 2020-08-31 ENCOUNTER — Ambulatory Visit (INDEPENDENT_AMBULATORY_CARE_PROVIDER_SITE_OTHER): Payer: Medicare PPO | Admitting: *Deleted

## 2020-08-31 ENCOUNTER — Other Ambulatory Visit: Payer: Self-pay

## 2020-08-31 DIAGNOSIS — Z952 Presence of prosthetic heart valve: Secondary | ICD-10-CM | POA: Diagnosis not present

## 2020-08-31 DIAGNOSIS — Z5181 Encounter for therapeutic drug level monitoring: Secondary | ICD-10-CM | POA: Diagnosis not present

## 2020-08-31 LAB — POCT INR: INR: 2.6 (ref 2.0–3.0)

## 2020-08-31 NOTE — Patient Instructions (Signed)
CHANGED TABLET FROM 10MG  TO 5MG  Continue warfarin 1 tablet daily except 1/2 tablet on Sundays, Tuesdays and Thursdays Recheck in 6 weeks Continue greens

## 2020-09-07 ENCOUNTER — Other Ambulatory Visit: Payer: Self-pay

## 2020-09-07 ENCOUNTER — Other Ambulatory Visit (HOSPITAL_COMMUNITY): Payer: Self-pay | Admitting: Internal Medicine

## 2020-09-07 ENCOUNTER — Ambulatory Visit (HOSPITAL_COMMUNITY)
Admission: RE | Admit: 2020-09-07 | Discharge: 2020-09-07 | Disposition: A | Discharge status: Home / Self Care | Payer: Medicare PPO | Source: Ambulatory Visit | Attending: Internal Medicine | Admitting: Internal Medicine

## 2020-09-07 DIAGNOSIS — M160 Bilateral primary osteoarthritis of hip: Secondary | ICD-10-CM | POA: Diagnosis not present

## 2020-09-07 DIAGNOSIS — R109 Unspecified abdominal pain: Secondary | ICD-10-CM | POA: Diagnosis not present

## 2020-09-07 DIAGNOSIS — K5792 Diverticulitis of intestine, part unspecified, without perforation or abscess without bleeding: Secondary | ICD-10-CM | POA: Diagnosis not present

## 2020-09-07 DIAGNOSIS — M47816 Spondylosis without myelopathy or radiculopathy, lumbar region: Secondary | ICD-10-CM | POA: Diagnosis not present

## 2020-09-08 NOTE — Telephone Encounter (Signed)
Thank you Mr. Lingelbach.  Yes, antibiotics can often affect the PT/INR level making the blood more "thin."  I will forward this to [facility] in our anticoagulation clinic in case adjustments need to be made in your dose.

## 2020-09-22 ENCOUNTER — Ambulatory Visit (INDEPENDENT_AMBULATORY_CARE_PROVIDER_SITE_OTHER): Payer: Medicare PPO | Admitting: *Deleted

## 2020-09-22 DIAGNOSIS — Z952 Presence of prosthetic heart valve: Secondary | ICD-10-CM

## 2020-09-22 DIAGNOSIS — Z5181 Encounter for therapeutic drug level monitoring: Secondary | ICD-10-CM

## 2020-09-22 LAB — POCT INR: INR: 2.4 (ref 2.0–3.0)

## 2020-09-22 NOTE — Patient Instructions (Signed)
Continue warfarin 1 tablet daily except 1/2 tablet on Sundays, Tuesdays and Thursdays Recheck in 6 weeks Continue greens

## 2020-10-07 NOTE — Addendum Note (Signed)
Encounter addended by: Annie Paras on: 10/07/2020 9:55 AM  Actions taken: Letter saved

## 2020-10-30 ENCOUNTER — Other Ambulatory Visit: Payer: Self-pay | Admitting: Cardiology

## 2020-11-03 ENCOUNTER — Ambulatory Visit (INDEPENDENT_AMBULATORY_CARE_PROVIDER_SITE_OTHER): Payer: Medicare PPO | Admitting: *Deleted

## 2020-11-03 DIAGNOSIS — Z5181 Encounter for therapeutic drug level monitoring: Secondary | ICD-10-CM | POA: Diagnosis not present

## 2020-11-03 DIAGNOSIS — I359 Nonrheumatic aortic valve disorder, unspecified: Secondary | ICD-10-CM | POA: Diagnosis not present

## 2020-11-03 DIAGNOSIS — Z952 Presence of prosthetic heart valve: Secondary | ICD-10-CM | POA: Diagnosis not present

## 2020-11-03 LAB — POCT INR: INR: 2.9 (ref 2.0–3.0)

## 2020-11-03 NOTE — Patient Instructions (Signed)
Continue warfarin 1 tablet daily except 1/2 tablet on Sundays, Tuesdays and Thursdays Recheck in 6 weeks Continue greens

## 2020-11-04 DIAGNOSIS — C44629 Squamous cell carcinoma of skin of left upper limb, including shoulder: Secondary | ICD-10-CM | POA: Diagnosis not present

## 2020-11-04 DIAGNOSIS — Z85828 Personal history of other malignant neoplasm of skin: Secondary | ICD-10-CM | POA: Diagnosis not present

## 2020-11-04 DIAGNOSIS — L570 Actinic keratosis: Secondary | ICD-10-CM | POA: Diagnosis not present

## 2020-11-04 DIAGNOSIS — C44619 Basal cell carcinoma of skin of left upper limb, including shoulder: Secondary | ICD-10-CM | POA: Diagnosis not present

## 2020-11-04 DIAGNOSIS — D485 Neoplasm of uncertain behavior of skin: Secondary | ICD-10-CM | POA: Diagnosis not present

## 2020-11-19 DIAGNOSIS — C44619 Basal cell carcinoma of skin of left upper limb, including shoulder: Secondary | ICD-10-CM | POA: Diagnosis not present

## 2020-11-19 DIAGNOSIS — L988 Other specified disorders of the skin and subcutaneous tissue: Secondary | ICD-10-CM | POA: Diagnosis not present

## 2020-12-14 DIAGNOSIS — K219 Gastro-esophageal reflux disease without esophagitis: Secondary | ICD-10-CM | POA: Diagnosis not present

## 2020-12-14 DIAGNOSIS — I10 Essential (primary) hypertension: Secondary | ICD-10-CM | POA: Diagnosis not present

## 2020-12-14 DIAGNOSIS — Z79899 Other long term (current) drug therapy: Secondary | ICD-10-CM | POA: Diagnosis not present

## 2020-12-14 DIAGNOSIS — N1832 Chronic kidney disease, stage 3b: Secondary | ICD-10-CM | POA: Diagnosis not present

## 2020-12-14 DIAGNOSIS — R7301 Impaired fasting glucose: Secondary | ICD-10-CM | POA: Diagnosis not present

## 2020-12-14 DIAGNOSIS — I712 Thoracic aortic aneurysm, without rupture, unspecified: Secondary | ICD-10-CM | POA: Diagnosis not present

## 2020-12-14 DIAGNOSIS — I2510 Atherosclerotic heart disease of native coronary artery without angina pectoris: Secondary | ICD-10-CM | POA: Diagnosis not present

## 2020-12-14 DIAGNOSIS — J8410 Pulmonary fibrosis, unspecified: Secondary | ICD-10-CM | POA: Diagnosis not present

## 2020-12-14 DIAGNOSIS — E785 Hyperlipidemia, unspecified: Secondary | ICD-10-CM | POA: Diagnosis not present

## 2020-12-15 ENCOUNTER — Ambulatory Visit (INDEPENDENT_AMBULATORY_CARE_PROVIDER_SITE_OTHER): Payer: Medicare PPO | Admitting: *Deleted

## 2020-12-15 DIAGNOSIS — Z952 Presence of prosthetic heart valve: Secondary | ICD-10-CM | POA: Diagnosis not present

## 2020-12-15 DIAGNOSIS — Z5181 Encounter for therapeutic drug level monitoring: Secondary | ICD-10-CM | POA: Diagnosis not present

## 2020-12-15 LAB — POCT INR: INR: 2.5 (ref 2.0–3.0)

## 2020-12-15 NOTE — Patient Instructions (Signed)
Description   Continue warfarin 1 tablet daily except 1/2 tablet on Sundays, Tuesdays and Thursdays Recheck in 6 weeks

## 2020-12-21 DIAGNOSIS — I712 Thoracic aortic aneurysm, without rupture, unspecified: Secondary | ICD-10-CM | POA: Diagnosis not present

## 2020-12-21 DIAGNOSIS — N1832 Chronic kidney disease, stage 3b: Secondary | ICD-10-CM | POA: Diagnosis not present

## 2020-12-21 DIAGNOSIS — Z23 Encounter for immunization: Secondary | ICD-10-CM | POA: Diagnosis not present

## 2020-12-21 DIAGNOSIS — Z6831 Body mass index (BMI) 31.0-31.9, adult: Secondary | ICD-10-CM | POA: Diagnosis not present

## 2020-12-21 DIAGNOSIS — E785 Hyperlipidemia, unspecified: Secondary | ICD-10-CM | POA: Diagnosis not present

## 2020-12-21 DIAGNOSIS — I10 Essential (primary) hypertension: Secondary | ICD-10-CM | POA: Diagnosis not present

## 2020-12-21 DIAGNOSIS — I480 Paroxysmal atrial fibrillation: Secondary | ICD-10-CM | POA: Diagnosis not present

## 2020-12-21 DIAGNOSIS — I359 Nonrheumatic aortic valve disorder, unspecified: Secondary | ICD-10-CM | POA: Diagnosis not present

## 2021-01-26 ENCOUNTER — Ambulatory Visit (INDEPENDENT_AMBULATORY_CARE_PROVIDER_SITE_OTHER): Payer: Medicare PPO | Admitting: *Deleted

## 2021-01-26 ENCOUNTER — Other Ambulatory Visit: Payer: Self-pay | Admitting: Cardiology

## 2021-01-26 DIAGNOSIS — N200 Calculus of kidney: Secondary | ICD-10-CM | POA: Diagnosis not present

## 2021-01-26 DIAGNOSIS — Z952 Presence of prosthetic heart valve: Secondary | ICD-10-CM | POA: Diagnosis not present

## 2021-01-26 DIAGNOSIS — I359 Nonrheumatic aortic valve disorder, unspecified: Secondary | ICD-10-CM | POA: Diagnosis not present

## 2021-01-26 DIAGNOSIS — Z5181 Encounter for therapeutic drug level monitoring: Secondary | ICD-10-CM

## 2021-01-26 LAB — POCT INR: INR: 3.1 — AB (ref 2.0–3.0)

## 2021-01-26 NOTE — Patient Instructions (Signed)
Continue warfarin 1 tablet daily except 1/2 tablet on Sundays, Tuesdays and Thursdays.  Recheck in 6 weeks 

## 2021-03-03 ENCOUNTER — Other Ambulatory Visit: Payer: Self-pay

## 2021-03-03 ENCOUNTER — Encounter: Payer: Self-pay | Admitting: Cardiology

## 2021-03-03 ENCOUNTER — Ambulatory Visit: Payer: Medicare PPO | Admitting: Cardiology

## 2021-03-03 VITALS — BP 135/80 | HR 67 | Ht 67.5 in | Wt 204.4 lb

## 2021-03-03 DIAGNOSIS — I7121 Aneurysm of the ascending aorta, without rupture: Secondary | ICD-10-CM | POA: Diagnosis not present

## 2021-03-03 DIAGNOSIS — Z952 Presence of prosthetic heart valve: Secondary | ICD-10-CM

## 2021-03-03 DIAGNOSIS — E782 Mixed hyperlipidemia: Secondary | ICD-10-CM | POA: Diagnosis not present

## 2021-03-03 DIAGNOSIS — I10 Essential (primary) hypertension: Secondary | ICD-10-CM | POA: Diagnosis not present

## 2021-03-03 NOTE — Patient Instructions (Addendum)

## 2021-03-03 NOTE — Progress Notes (Signed)
Cardiology Office Note  Date: 03/03/2021   ID: Stephen Moses, Stephen Moses 1945/08/28, MRN [id number]  PCP:  [name], MD  Cardiologist:  Rozann Lesches, MD Electrophysiologist:  None   Chief Complaint  Patient presents with   Cardiac follow-up    History of Present Illness: Stephen Moses is a 76 y.o. male last seen in December 2021.  He is here for a routine visit.  Reports good stamina, no exertional chest pain and NYHA class II dyspnea with typical activities including outside work.  He is on Coumadin with follow-up in the anticoagulation clinic.  He does not report any spontaneous bleeding problems.  Chest CTA in February 2022 revealed increased size of ascending thoracic aortic aneurysm measuring 4.8 to 4.9 cm in comparison to 4.7 cm by prior study.  He was seen by Dr. Cyndia Bent in March 2022 and has follow-up imaging planned for this year.  He remains asymptomatic.  I reviewed his medications which are noted below.  He continues to follow regularly with Dr. Willey Blade.  I personally reviewed his ECG today which shows sinus rhythm with prolonged PR interval and nonspecific T wave changes.  Past Medical History:  Diagnosis Date   Ascending aortic aneurysm 2010    23-mm St. Jude mechanical valve conduit and re-implantation of the coronary arteries   Bicuspid aortic valve    Calculus of kidney    Essential hypertension    Long term current use of anticoagulant    Normocytic anemia    Pancytopenia ([facility])    Paraspinal muscle spasm     Past Surgical History:  Procedure Laterality Date   BACK SURGERY  2009   CARDIAC VALVE REPLACEMENT  2010   COLONOSCOPY     COLONOSCOPY N/A 10/16/2014   Dr. Laural Golden: two colon polyps (tubular adenomas), scattered diverticula at sigmoid, small hemorrhoids above and below dentate line, surveillance due in 2021.    COLONOSCOPY N/A 03/12/2020   Procedure: COLONOSCOPY;  Surgeon: Rogene Houston, MD;  [facility];  Service: Endoscopy;   Laterality: N/A;  8:00   EYE SURGERY     Left rotator cuff repair     POLYPECTOMY  03/12/2020   Procedure: POLYPECTOMY;  Surgeon: Rogene Houston, MD;  [facility];  Service: Endoscopy;;   SHOULDER SURGERY Bilateral     Current Outpatient Medications  Medication Sig Dispense Refill   acetaminophen (TYLENOL) 500 MG tablet Take 1,000 mg by mouth every 6 (six) hours as needed for mild pain or moderate pain.     amLODipine (NORVASC) 5 MG tablet Take 5 mg by mouth daily in the afternoon.     atorvastatin (LIPITOR) 40 MG tablet TAKE 1 TABLET BY MOUTH EVERY DAY 90 tablet 1   esomeprazole (NEXIUM) 40 MG capsule Take 40 mg by mouth daily as needed (acid reflux/indigestion.).     metoprolol succinate (TOPROL-XL) 25 MG 24 hr tablet Take 25 mg by mouth at bedtime.      warfarin (COUMADIN) 5 MG tablet TAKE ONE (1) TABLET DAILY EXCEPT ONE HALF (1/2) TABLET ON SUNDAYS, TUESDAYS AND THURSDAYS OR AS DIRECTED 90 tablet 4   No current facility-administered medications for this visit.   Allergies:  Codeine   ROS: Occasional sense of palpitations, nothing prolonged, no syncope.  Physical Exam: VS:  BP 135/80    Pulse 67    Ht 5' 7.5" (1.715 m)    Wt 204 lb 6.4 oz (92.7 kg)    SpO2 97%  BMI 31.54 kg/m , BMI Body mass index is 31.54 kg/m.  Wt Readings from Last 3 Encounters:  03/03/21 204 lb 6.4 oz (92.7 kg)  03/12/20 200 lb (90.7 kg)  02/06/20 206 lb 9.6 oz (93.7 kg)    General: Patient appears comfortable at rest. HEENT: Conjunctiva and lids normal, wearing a mask. Neck: Supple, no elevated JVP or carotid bruits, no thyromegaly. Lungs: Clear to auscultation, nonlabored breathing at rest. Cardiac: Regular rate and rhythm, no S3, mechanical click in S2 with 2/6 systolic murmur. Extremities: No pitting edema.  ECG:  An ECG dated 12/23/2019 was personally reviewed today and demonstrated:  Sinus tachycardia with rightward axis.  Recent Labwork: 04/20/2020: Creatinine, Ser 1.90  October  2021: BUN 19, creatinine 1.94, potassium 5.2, AST 26, ALT 22, hemoglobin 14.6, platelets 231, cholesterol 139, triglycerides 112, HDL 49, LDL 70  Other Studies Reviewed Today:  Echocardiogram 02/07/2018: - Left ventricle: The cavity size was normal. Wall thickness was    increased in a pattern of moderate LVH. Systolic function was    normal. The estimated ejection fraction was in the range of 55%    to 60%. Wall motion was normal; there were no regional wall    motion abnormalities. Doppler parameters are consistent with    abnormal left ventricular relaxation (grade 1 diastolic    dysfunction).  - Aortic valve: There is a 23 mm St Jude mechanical valve is    present in the AV position. Mildly calcified annulus. Mildly    thickened leaflets. There was no stenosis. There was no    significant regurgitation. Mean gradient (S): 15 mm Hg.  - Mitral valve: Mildly calcified annulus. Mildly thickened leaflets.    Chest CTA 04/20/2020: IMPRESSION: 1. Stable appearance of prosthetic aortic valve and ascending aortic graft without evidence of pseudoaneurysm. 2. Aneurysmal disease of the proximal aortic arch is again noted. Maximal diameter appears to have increased slightly since prior imaging with measured maximal diameter currently of approximately 4.8-4.9 cm compared to 4.7 cm on the prior study. By unenhanced CT, the proximal arch measured approximately 4.4 cm in 2016. Recommend semi-annual imaging followup by CTA or MRA and referral to cardiothoracic surgery if not already obtained. This recommendation follows 2010 ACCF/AHA/AATS/ACR/ASA/SCA/SCAI/SIR/STS/SVM Guidelines for the Diagnosis and Management of Patients With Thoracic Aortic Disease. Circulation. 2010; 121: L892-J19. Aortic aneurysm NOS (ICD10-I71.9) 3. Stable emphysematous lung disease and scattered pulmonary scarring as well as bilateral pulmonary bullae.   Emphysema (ICD10-J43.9).  Assessment and Plan:  1.  History of  bicuspid aortic valve status post mechanical AVR with valve conduit and reimplantation of coronaries in 2010.  His last echocardiogram was in 2019 at which point prosthetic function was stable, mean gradient 15 mmHg.  No change in cardiac murmur.  He remains on chronic Coumadin with follow-up in the anticoagulation clinic and otherwise doing well.  2.  Asymptomatic, ascending thoracic aortic aneurysm most recently measuring 4.8 to 4.9 cm by CT imaging last year.  He is now following with Dr. Cyndia Bent and has repeat imaging planned for March with clinical visit at that time.  3.  Essential hypertension, continue Norvasc and Toprol-XL.  4.  Mixed hyperlipidemia on Lipitor with ongoing follow-up by Dr. Willey Blade.  Medication Adjustments/Labs and Tests Ordered: Current medicines are reviewed at length with the patient today.  Concerns regarding medicines are outlined above.   Tests Ordered: Orders Placed This Encounter  Procedures   EKG 12-Lead    Medication Changes: No orders of the defined types were  placed in this encounter.   Disposition:  Follow up 1 year.  Signed, Satira Sark, MD, [facility] 03/03/2021 4:21 PM    [place] [facility] [address] [place], [place] [address] Phone: [phone]; Fax: [phone]

## 2021-03-09 ENCOUNTER — Ambulatory Visit (INDEPENDENT_AMBULATORY_CARE_PROVIDER_SITE_OTHER): Payer: Medicare PPO | Admitting: *Deleted

## 2021-03-09 DIAGNOSIS — I359 Nonrheumatic aortic valve disorder, unspecified: Secondary | ICD-10-CM

## 2021-03-09 DIAGNOSIS — Z5181 Encounter for therapeutic drug level monitoring: Secondary | ICD-10-CM | POA: Diagnosis not present

## 2021-03-09 DIAGNOSIS — Z952 Presence of prosthetic heart valve: Secondary | ICD-10-CM

## 2021-03-09 LAB — POCT INR: INR: 2.4 (ref 2.0–3.0)

## 2021-03-09 NOTE — Patient Instructions (Signed)
Continue warfarin 1 tablet daily except 1/2 tablet on Sundays, Tuesdays and Thursdays.  Recheck in 6 weeks 

## 2021-03-23 DIAGNOSIS — L570 Actinic keratosis: Secondary | ICD-10-CM | POA: Diagnosis not present

## 2021-03-23 DIAGNOSIS — C44329 Squamous cell carcinoma of skin of other parts of face: Secondary | ICD-10-CM | POA: Diagnosis not present

## 2021-04-01 ENCOUNTER — Other Ambulatory Visit: Payer: Self-pay | Admitting: Surgery

## 2021-04-01 DIAGNOSIS — C44329 Squamous cell carcinoma of skin of other parts of face: Secondary | ICD-10-CM | POA: Diagnosis not present

## 2021-04-01 DIAGNOSIS — I7121 Aneurysm of the ascending aorta, without rupture: Secondary | ICD-10-CM

## 2021-04-15 DIAGNOSIS — I10 Essential (primary) hypertension: Secondary | ICD-10-CM | POA: Diagnosis not present

## 2021-04-15 DIAGNOSIS — N1832 Chronic kidney disease, stage 3b: Secondary | ICD-10-CM | POA: Diagnosis not present

## 2021-04-15 DIAGNOSIS — Z79899 Other long term (current) drug therapy: Secondary | ICD-10-CM | POA: Diagnosis not present

## 2021-04-15 DIAGNOSIS — R7301 Impaired fasting glucose: Secondary | ICD-10-CM | POA: Diagnosis not present

## 2021-04-15 DIAGNOSIS — E785 Hyperlipidemia, unspecified: Secondary | ICD-10-CM | POA: Diagnosis not present

## 2021-04-20 ENCOUNTER — Ambulatory Visit (INDEPENDENT_AMBULATORY_CARE_PROVIDER_SITE_OTHER): Payer: Medicare PPO | Admitting: *Deleted

## 2021-04-20 DIAGNOSIS — Z5181 Encounter for therapeutic drug level monitoring: Secondary | ICD-10-CM

## 2021-04-20 DIAGNOSIS — M5136 Other intervertebral disc degeneration, lumbar region: Secondary | ICD-10-CM | POA: Diagnosis not present

## 2021-04-20 DIAGNOSIS — N200 Calculus of kidney: Secondary | ICD-10-CM | POA: Diagnosis not present

## 2021-04-20 DIAGNOSIS — Z952 Presence of prosthetic heart valve: Secondary | ICD-10-CM | POA: Diagnosis not present

## 2021-04-20 LAB — POCT INR: INR: 2.4 (ref 2.0–3.0)

## 2021-04-20 NOTE — Patient Instructions (Signed)
Continue warfarin 1 tablet daily except 1/2 tablet on Sundays, Tuesdays and Thursdays.  Recheck in 6 weeks 

## 2021-04-22 DIAGNOSIS — J8410 Pulmonary fibrosis, unspecified: Secondary | ICD-10-CM | POA: Diagnosis not present

## 2021-04-22 DIAGNOSIS — D6869 Other thrombophilia: Secondary | ICD-10-CM | POA: Diagnosis not present

## 2021-04-22 DIAGNOSIS — N1832 Chronic kidney disease, stage 3b: Secondary | ICD-10-CM | POA: Diagnosis not present

## 2021-04-22 DIAGNOSIS — I10 Essential (primary) hypertension: Secondary | ICD-10-CM | POA: Diagnosis not present

## 2021-05-10 DIAGNOSIS — L570 Actinic keratosis: Secondary | ICD-10-CM | POA: Diagnosis not present

## 2021-05-19 ENCOUNTER — Other Ambulatory Visit: Payer: Self-pay | Admitting: Surgery

## 2021-05-19 ENCOUNTER — Ambulatory Visit: Payer: Medicare PPO | Admitting: Surgery

## 2021-05-19 ENCOUNTER — Ambulatory Visit
Admission: RE | Admit: 2021-05-19 | Discharge: 2021-05-19 | Disposition: A | Discharge status: Home / Self Care | Payer: Medicare PPO | Source: Ambulatory Visit | Attending: Surgery | Admitting: Surgery

## 2021-05-19 ENCOUNTER — Other Ambulatory Visit: Payer: Self-pay

## 2021-05-19 ENCOUNTER — Encounter: Payer: Self-pay | Admitting: Surgery

## 2021-05-19 VITALS — BP 147/77 | HR 84 | Resp 20 | Ht 68.0 in | Wt 204.0 lb

## 2021-05-19 DIAGNOSIS — I7121 Aneurysm of the ascending aorta, without rupture: Secondary | ICD-10-CM | POA: Diagnosis not present

## 2021-05-19 DIAGNOSIS — J439 Emphysema, unspecified: Secondary | ICD-10-CM | POA: Diagnosis not present

## 2021-05-19 DIAGNOSIS — J479 Bronchiectasis, uncomplicated: Secondary | ICD-10-CM | POA: Diagnosis not present

## 2021-05-19 NOTE — Progress Notes (Signed)
? ? ?HPI: ? ?The patient is a 76 year old gentleman with a history of hypertension, bicuspid aortic valve disease, and ascending aortic aneurysm who underwent Bentall procedure using a 23 mm St. Jude mechanical valved graft by me in 2010.  The aorta was placed up to the origin of the innominate artery using hypothermic circulatory arrest.  He had an uneventful course and has done well over the years on Coumadin with his INR followed in the anticoagulation clinic.  He has had some enlargement of his aortic arch beyond the ascending graft and CTA of the chest in August 2021 showed it to be 4.7 cm in greatest diameter.  This was measured at 4.5 cm on CTA of the chest in April 2017.  CTA of the chest from 04/20/2020 showed a slight increase in the maximum diameter of the proximal aortic arch to 4.8 to 4.9 cm. The distal arch measured 3 cm and the descending aorta measured 2.6 to 2.9 cm.  When I saw him 1 year ago CTA of the chest dated 04/20/2020 showed the maximum diameter of the aortic arch to be 4.8 to 4.9 cm.  He has continued to feel well.  He denies any chest or back pain ? ?Current Outpatient Medications  ?Medication Sig Dispense Refill  ? acetaminophen (TYLENOL) 500 MG tablet Take 1,000 mg by mouth every 6 (six) hours as needed for mild pain or moderate pain.    ? amLODipine (NORVASC) 5 MG tablet Take 5 mg by mouth daily in the afternoon.    ? atorvastatin (LIPITOR) 40 MG tablet TAKE 1 TABLET BY MOUTH EVERY DAY 90 tablet 1  ? esomeprazole (NEXIUM) 40 MG capsule Take 40 mg by mouth daily as needed (acid reflux/indigestion.).    ? metoprolol succinate (TOPROL-XL) 25 MG 24 hr tablet Take 25 mg by mouth at bedtime.     ? warfarin (COUMADIN) 5 MG tablet TAKE ONE (1) TABLET DAILY EXCEPT ONE HALF (1/2) TABLET ON SUNDAYS, TUESDAYS AND THURSDAYS OR AS DIRECTED 90 tablet 4  ? ?No current facility-administered medications for this visit.  ? ? ? ?Physical Exam: ?BP (!) 147/77   Pulse 84   Resp 20   Ht '5\' 8"'$  (1.727 m)    Wt 204 lb (92.5 kg)   SpO2 93%   BMI 31.02 kg/m?  ?He looks well. ?Cardiac exam shows a regular rate and rhythm with crisp mechanical valve click.  There is no murmur. ?Lungs are clear. ? ?Diagnostic Tests: ? ?Narrative & Impression  ?CLINICAL DATA:  Aortic valve replacement with graft repair of the ?ascending thoracic aorta ?  ?EXAM: ?CT CHEST WITHOUT CONTRAST ?  ?TECHNIQUE: ?Multidetector CT imaging of the chest was performed following the ?standard protocol without IV contrast. ?  ?RADIATION DOSE REDUCTION: This exam was performed according to the ?departmental dose-optimization program which includes automated ?exposure control, adjustment of the mA and/or kV according to ?patient size and/or use of iterative reconstruction technique. ?  ?COMPARISON:  CT chest angio dated April 20, 2020 ?  ?FINDINGS: ?Cardiovascular: Normal heart size. No pericardial effusion. Prior ?aortic valve replacement and graft repair of the ascending thoracic ?aorta just beyond the distal anastomosis there is dilation of the ?proximal aortic arch. Maximum diameter of the aortic arch is 4.9 mm ?on axial image, unchanged compared to prior exam. ?  ?Mediastinum/Nodes: Patulous esophagus. Normal thyroid. Unchanged ?mildly enlarged lower right paratracheal lymph node, likely ?reactive. No pathologically enlarged lymph nodes seen in the chest. ?  ?Lungs/Pleura: Central airways are patent.  Centrilobular emphysema. ?Basilar reticular opacities with traction bronchiectasis, unchanged ?compared to prior exam. No consolidation, pleural effusion or ?pneumothorax. ?  ?Upper Abdomen: No acute abnormality. ?  ?Musculoskeletal: No chest wall mass or suspicious bone lesions ?identified. ?  ?IMPRESSION: ?1. Within limitations of noncontrast exam, stable appearance of ?prosthetic aortic valve and ascending aortic graft. ?2. Stable aneurysmal dilation of the proximal aortic arch. Recommend ?semi-annual imaging followup by CTA or MRA and referral  to ?cardiothoracic surgery if not already obtained. This recommendation ?follows 2010 ACCF/AHA/AATS/ACR/ASA/SCA/SCAI/SIR/STS/SVM Guidelines ?for the Diagnosis and Management of Patients With Thoracic Aortic ?Disease. Circulation. 2010; 121: Z610-R60. Aortic aneurysm NOS ?(ICD10-I71.9) ?3. Mild basilar predominant fibrotic changes, no evidence ?progression when compared to prior exam. Compatible with combined ?pulmonary fibrosis and emphysema. ?4. Aortic Atherosclerosis (ICD10-I70.0) and Emphysema (ICD10-J43.9). ?  ?  ?Electronically Signed ?  By: Yetta Glassman M.D. ?  On: 05/19/2021 11:20 ?   ? ? ?Impression: ? ?CT of the chest shows stable aneurysmal dilatation of the proximal aortic arch to 4.9 cm.  I compared this to his previous scans and there has been no significant change in the appearance.  This is still below the surgical threshold of 5.5 cm and I would recommend continued observation especially in this 76 year old gentleman who has had previous Bentall procedure.  I reviewed the CT images with him and answered all of his questions.  I stressed the importance of continued good blood pressure control in preventing further enlargement and acute aortic dissection.  I also advised him against doing any heavy lifting that may require a Valsalva maneuver and could suddenly raise his blood pressure to high levels. ? ?Plan: ? ?I will see him back in 1 year with a CT scan of the chest without contrast since he has stage III chronic kidney disease with a creatinine of 1.9. ? ?I spent 20 minutes performing this established patient evaluation and > 50% of this time was spent face to face counseling and coordinating the care of this patient's aortic aneurysm.  ? ? ?Gaye Pollack, MD ?[facility] ?([phone] ? ? ? ? ? ?

## 2021-05-24 ENCOUNTER — Encounter: Payer: Self-pay | Admitting: Cardiology

## 2021-06-01 ENCOUNTER — Ambulatory Visit (INDEPENDENT_AMBULATORY_CARE_PROVIDER_SITE_OTHER): Payer: Medicare PPO | Admitting: *Deleted

## 2021-06-01 DIAGNOSIS — Z5181 Encounter for therapeutic drug level monitoring: Secondary | ICD-10-CM

## 2021-06-01 DIAGNOSIS — Z952 Presence of prosthetic heart valve: Secondary | ICD-10-CM | POA: Diagnosis not present

## 2021-06-01 LAB — POCT INR: INR: 2.6 (ref 2.0–3.0)

## 2021-06-01 NOTE — Patient Instructions (Signed)
Continue warfarin 1 tablet daily except 1/2 tablet on Sundays, Tuesdays and Thursdays.  Recheck in 6 weeks 

## 2021-06-04 ENCOUNTER — Encounter: Payer: Self-pay | Admitting: Internal Medicine

## 2021-06-04 ENCOUNTER — Ambulatory Visit: Payer: Medicare PPO | Admitting: Internal Medicine

## 2021-06-04 DIAGNOSIS — J84112 Idiopathic pulmonary fibrosis: Secondary | ICD-10-CM

## 2021-06-04 MED ORDER — FAMOTIDINE 20 MG PO TABS: ORAL_TABLET | ORAL | 11 refills | Status: DC

## 2021-06-04 MED ORDER — ESOMEPRAZOLE MAGNESIUM 40 MG PO CPDR: DELAYED_RELEASE_CAPSULE | ORAL | 11 refills | Status: DC

## 2021-06-04 NOTE — Progress Notes (Signed)
? ?Stephen Moses, male    DOB: 1945-09-18,    MRN: [id number] ? ? ?Brief patient profile:  ?16  yowm  referred to pulmonary clinic in [place]  06/04/2021 by Stephen Moses  for PF   ? ? ?05/12/19  Stephen Moses eval for PF min progression since 76/2017 but Probable Mild  UIP  ? ?History of Present Illness  ?06/04/2021  Pulmonary/ 1st office eval/ Stephen Moses / [facility]  ?Chief Complaint  ?Patient presents with  ? New Patient (Initial Visit)  ?  SOB was seeing Dr. Shearon Moses   ?Dyspnea:  mostly with carrying heavy objects / able to walk to end of drive way and back up hill to house and feels it's a bit more difficult than prior years but can still do s stopping ?Cough: slt hoarse min cough/ attributes to overt HB no GI rx  ?Sleep: flat one pillow / overt gerd symptoms worse  flat ?SABA use: no hfa  ? ?No obvious day to day or daytime variability or assoc excess/ purulent sputum or mucus plugs or hemoptysis or cp or chest tightness, subjective wheeze or overt sinus or hb symptoms.  ? ?  Also denies any obvious fluctuation of symptoms with weather or environmental changes or other aggravating or alleviating factors except as outlined above  ? ?No unusual exposure hx or h/o childhood pna/ asthma or knowledge of premature birth. ? ?Current Allergies, Complete Past Medical History, Past Surgical History, Family History, and Social History were reviewed in [employer] record. ? ?ROS  The following are not active complaints unless bolded ?Hoarseness, sore throat, dysphagia, dental problems, itching, sneezing,  nasal congestion or discharge of excess mucus or purulent secretions, ear ache,   fever, chills, sweats, unintended wt loss or wt gain, classically pleuritic or exertional cp,  orthopnea pnd or arm/hand swelling  or leg swelling, presyncope, palpitations, abdominal pain, anorexia, nausea, vomiting, diarrhea  or change in bowel habits or change in bladder habits, change in stools or change in urine, dysuria,  hematuria,  rash, arthralgias, visual complaints, headache, numbness, weakness or ataxia or problems with walking or coordination,  change in mood or  memory. ?      ?   ? ?Past Medical History:  ?Diagnosis Date  ? Ascending aortic aneurysm ([place]) 2010  ?  23-mm St. Jude mechanical valve conduit and re-implantation of the coronary arteries  ? Bicuspid aortic valve   ? Calculus of kidney   ? Essential hypertension   ? Long term current use of anticoagulant   ? Normocytic anemia   ? Pancytopenia ([place])   ? Paraspinal muscle spasm   ? ? ?Outpatient Medications Prior to Visit  ?Medication Sig Dispense Refill  ? acetaminophen (TYLENOL) 500 MG tablet Take 1,000 mg by mouth every 6 (six) hours as needed for mild pain or moderate pain.    ? amLODipine (NORVASC) 5 MG tablet Take 5 mg by mouth daily in the afternoon.    ? atorvastatin (LIPITOR) 40 MG tablet TAKE 1 TABLET BY MOUTH EVERY DAY 90 tablet 1  ? metoprolol succinate (TOPROL-XL) 25 MG 24 hr tablet Take 25 mg by mouth at bedtime.     ? warfarin (COUMADIN) 5 MG tablet TAKE ONE (1) TABLET DAILY EXCEPT ONE HALF (1/2) TABLET ON SUNDAYS, TUESDAYS AND THURSDAYS OR AS DIRECTED 90 tablet 4  ? esomeprazole (NEXIUM) 40 MG capsule Take 40 mg by mouth daily as needed (acid reflux/indigestion.).    ? ?No facility-administered medications prior to visit.  ? ? ? ?  Objective:  ?  ? ?BP 132/80 (BP Location: Left Arm, Patient Position: Sitting)   Pulse 78   Temp 97.7 ?F (36.5 ?C) (Temporal)   Ht '5\' 8"'$  (1.727 m)   Wt 204 lb 6.4 oz (92.7 kg)   SpO2 98% Comment: ra  BMI 31.08 kg/m?  ? ?SpO2: 98 % (ra) ? ?Amb hoarse wm nad  ? ? HEENT : nl exam   ? ? ?NECK :  without JVD/Nodes/TM/ nl carotid upstrokes bilaterally ? ? ?LUNGS: no acc muscle use,  Nl contour chest with minimal dry crackles in bases bilaterally without cough on insp or exp maneuvers ? ? ?CV:  RRR  no s3 - mechanial S2  no increase in P2, and no edema  ? ?ABD:  soft and nontender with nl inspiratory excursion in the supine  position. No bruits or organomegaly appreciated, bowel sounds nl ? ?MS:  walks with [place]/  ext warm without deformities, calf tenderness, cyanosis - NO clubbing and No obvious joint restrictions  ? ?SKIN: warm and dry without lesions   ? ?NEURO:  alert, approp, nl sensorium with  no motor or cerebellar deficits apparent. ?A bit unsteady on his feet from chair to exam table.  ? ?   ?I personally reviewed images and agree with radiology impression as follows:  ? Chest CT w/o contrast  05/19/21 ?-  Mild basilar predominant fibrotic changes, no evidence ?progression when compared to prior exam. Compatible with combined ?pulmonary fibrosis and emphysema. ? ? ?Assessment  ? ?IPF (idiopathic pulmonary fibrosis) ([place]) ?Onset of symptoms around 2016/17 ?- HRCT 03/25/19  prob mild UIP   ?- 06/04/2021   Walked on RA  x  3  lap(s) =  approx 450  ft  @ mod/cane pace, stopped due to end of study with lowest 02 sats 92% s sob   ?- 06/04/2021  Collagen vasc, HSP profile >>> ? ?If this is uip it is extremely indolent but at age 76 will eventually likely progress albeit hopefully at a very slow pace and might me a candidate for antifibrotics but will hold off for now as has an obvious treatable assoc condition: GERD ? ?Use of PPI is associated with improved survival time and with decreased radiologic fibrosis per King's study published in AJRCCM vol 184 p1390.  Dec 2011 and also may have other beneficial effects as per the latest review in [place] vol [place] Jun 20016. ? ?This may not always be cause and effect, but given how universally underwhelming  (at least in terms of short term benefit)   and expensive  all the other  Drugs developed to day  have been for pf,   rec start  rx ppi / diet/ lifestyle modification and f/u with serial walking sats and lung volumes for now to put more points on the curve / establish firm baseline before considering additional measures.  ? ?>>>   see avs for instructions unique to this ov re max gerd  rx. ?  ? ?>>> f/u with pfts and do serial walking sats in meantime  ? ? ?Each maintenance medication was reviewed in detail including emphasizing most importantly the difference between maintenance and prns and under what circumstances the prns are to be triggered using an action plan format where appropriate. ? ?Total time for H and P, chart review, counseling,  directly observing portions of ambulatory 02 saturation study/ and generating customized AVS unique to this office visit / same day charting  > 40 min with  pt new to me ?  ?       ? ? ? ? ?Christinia Gully, MD ?06/04/2021 ?   ?

## 2021-06-04 NOTE — Patient Instructions (Addendum)
Nexium 40 mg   Take  30-60 min before first meal of the day and Pepcid (famotidine)  20 mg one hour before bedtime until return to office - this is the best way to tell whether stomach acid is contributing to your problem.   ? ?GERD (REFLUX)  is an extremely common cause of respiratory symptoms just like yours , many times with no obvious heartburn at all.  ? ? It can be treated with medication, but also with lifestyle changes including elevation of the head of your bed (ideally with 6-8inch blocks under the headboard of your bed),  Smoking cessation, avoidance of late meals, excessive alcohol, and avoid fatty foods, chocolate, peppermint, colas, red wine, and acidic juices such as orange juice.  ?NO MINT OR MENTHOL PRODUCTS SO NO COUGH DROPS  ?USE SUGARLESS CANDY INSTEAD (Jolley ranchers or Stover's or Life Savers) or even ice chips will also do - the key is to swallow to prevent all throat clearing. ?NO OIL BASED VITAMINS - use powdered substitutes.  Avoid fish oil when coughing.  ? ?We will walk you today for a baseline oxygen level with exertion  ? ?Make sure you check your oxygen saturation  AT  your highest level of activity (not after you stop)   to be sure it stays over 90% and track the level over time (connect to dots) ? ? ?Please remember to go to the lab department   for your tests - we will call you with the results when they are available. ?    ? ?Please schedule a follow up visit in 3 months but call sooner if needed with PFTs   ?

## 2021-06-05 ENCOUNTER — Encounter: Payer: Self-pay | Admitting: Internal Medicine

## 2021-06-05 LAB — ANA: Anti Nuclear Antibody (ANA): POSITIVE — AB

## 2021-06-05 LAB — RHEUMATOID FACTOR: Rheumatoid fact SerPl-aCnc: 38.6 IU/mL — ABNORMAL HIGH (ref <14.0)

## 2021-06-05 LAB — SEDIMENTATION RATE: Sed Rate: 2 mm/hr (ref 0–30)

## 2021-06-05 NOTE — Assessment & Plan Note (Addendum)
Onset of symptoms around 2016/17 ?- HRCT 03/25/19  prob mild UIP   ?- 06/04/2021   Walked on RA  x  3  lap(s) =  approx 450  ft  @ mod/cane pace, stopped due to end of study with lowest 02 sats 92% s sob   ?- 06/04/2021  Collagen vasc, HSP profile >>> ? ?If this is uip it is extremely indolent but at age 76 will eventually likely progress albeit hopefully at a very slow pace and might me a candidate for antifibrotics but will hold off for now as has an obvious treatable assoc condition: GERD ? ?Use of PPI is associated with improved survival time and with decreased radiologic fibrosis per King's study published in AJRCCM vol 184 p1390.  Dec 2011 and also may have other beneficial effects as per the latest review in [place] vol [place] Jun 20016. ? ?This may not always be cause and effect, but given how universally underwhelming  (at least in terms of short term benefit)   and expensive  all the other  Drugs developed to day  have been for pf,   rec start  rx ppi / diet/ lifestyle modification and f/u with serial walking sats and lung volumes for now to put more points on the curve / establish firm baseline before considering additional measures.  ? ?>>>   see avs for instructions unique to this ov re max gerd rx. ?  ? ?>>> f/u with pfts and do serial walking sats in meantime  ? ? ?Each maintenance medication was reviewed in detail including emphasizing most importantly the difference between maintenance and prns and under what circumstances the prns are to be triggered using an action plan format where appropriate. ? ?Total time for H and P, chart review, counseling,  directly observing portions of ambulatory 02 saturation study/ and generating customized AVS unique to this office visit / same day charting  > 40 min with pt new to me ?  ?       ?

## 2021-06-08 ENCOUNTER — Encounter: Payer: Self-pay | Admitting: Cardiology

## 2021-06-09 LAB — HYPERSENSITIVITY PNEUMONITIS
A. Pullulans Abs: NEGATIVE
A.Fumigatus #1 Abs: NEGATIVE
Micropolyspora faeni, IgG: NEGATIVE
Pigeon Serum Abs: NEGATIVE
Thermoact. Saccharii: NEGATIVE
Thermoactinomyces vulgaris, IgG: NEGATIVE

## 2021-06-09 LAB — CYCLIC CITRUL PEPTIDE ANTIBODY, IGG/IGA: Cyclic Citrullin Peptide Ab: 9 units (ref 0–19)

## 2021-06-10 ENCOUNTER — Other Ambulatory Visit: Payer: Self-pay | Admitting: Cardiology

## 2021-06-10 DIAGNOSIS — H524 Presbyopia: Secondary | ICD-10-CM | POA: Diagnosis not present

## 2021-06-10 DIAGNOSIS — Z961 Presence of intraocular lens: Secondary | ICD-10-CM | POA: Diagnosis not present

## 2021-06-11 ENCOUNTER — Other Ambulatory Visit: Payer: Self-pay

## 2021-06-11 DIAGNOSIS — R768 Other specified abnormal immunological findings in serum: Secondary | ICD-10-CM

## 2021-07-13 ENCOUNTER — Ambulatory Visit (INDEPENDENT_AMBULATORY_CARE_PROVIDER_SITE_OTHER): Payer: Medicare PPO | Admitting: *Deleted

## 2021-07-13 DIAGNOSIS — Z952 Presence of prosthetic heart valve: Secondary | ICD-10-CM

## 2021-07-13 DIAGNOSIS — Z5181 Encounter for therapeutic drug level monitoring: Secondary | ICD-10-CM | POA: Diagnosis not present

## 2021-07-13 LAB — POCT INR: INR: 2.9 (ref 2.0–3.0)

## 2021-07-13 NOTE — Patient Instructions (Signed)
Continue warfarin 1 tablet daily except 1/2 tablet on Sundays, Tuesdays and Thursdays.  Recheck in 6 weeks 

## 2021-07-27 ENCOUNTER — Ambulatory Visit (HOSPITAL_COMMUNITY)
Admission: RE | Admit: 2021-07-27 | Discharge: 2021-07-27 | Disposition: A | Discharge status: Home / Self Care | Payer: Medicare PPO | Source: Ambulatory Visit | Attending: Internal Medicine | Admitting: Internal Medicine

## 2021-07-27 DIAGNOSIS — J84112 Idiopathic pulmonary fibrosis: Secondary | ICD-10-CM | POA: Diagnosis not present

## 2021-07-27 LAB — PULMONARY FUNCTION TEST
DL/VA % pred: 77 %
DL/VA: 3.13 ml/min/mmHg/L
DLCO unc % pred: 55 %
DLCO unc: 13.16 ml/min/mmHg
FEF 25-75 Post: 2.55 L/sec
FEF 25-75 Pre: 2.33 L/sec
FEF2575-%Change-Post: 9 %
FEF2575-%Pred-Post: 122 %
FEF2575-%Pred-Pre: 111 %
FEV1-%Change-Post: 2 %
FEV1-%Pred-Post: 81 %
FEV1-%Pred-Pre: 79 %
FEV1-Post: 2.31 L
FEV1-Pre: 2.27 L
FEV1FVC-%Change-Post: -2 %
FEV1FVC-%Pred-Pre: 110 %
FEV6-%Change-Post: 3 %
FEV6-%Pred-Post: 78 %
FEV6-%Pred-Pre: 76 %
FEV6-Post: 2.9 L
FEV6-Pre: 2.81 L
FEV6FVC-%Change-Post: -1 %
FEV6FVC-%Pred-Post: 104 %
FEV6FVC-%Pred-Pre: 106 %
FVC-%Change-Post: 5 %
FVC-%Pred-Post: 74 %
FVC-%Pred-Pre: 71 %
FVC-Post: 2.95 L
FVC-Pre: 2.81 L
Post FEV1/FVC ratio: 78 %
Post FEV6/FVC ratio: 98 %
Pre FEV1/FVC ratio: 81 %
Pre FEV6/FVC Ratio: 100 %
RV % pred: 116 %
RV: 2.83 L
TLC % pred: 85 %
TLC: 5.66 L

## 2021-07-27 MED ORDER — ALBUTEROL SULFATE (2.5 MG/3ML) 0.083% IN NEBU
2.5000 mg | INHALATION_SOLUTION | Freq: Once | RESPIRATORY_TRACT | Status: AC
  Administered 2021-07-27: 2.5 mg via RESPIRATORY_TRACT

## 2021-07-29 ENCOUNTER — Telehealth: Payer: Self-pay | Admitting: Internal Medicine

## 2021-07-29 NOTE — Telephone Encounter (Signed)
Addressed in result note. Closing encounter.

## 2021-07-30 ENCOUNTER — Encounter: Payer: Self-pay | Admitting: Internal Medicine

## 2021-07-30 ENCOUNTER — Ambulatory Visit: Payer: Medicare PPO | Admitting: Internal Medicine

## 2021-07-30 ENCOUNTER — Ambulatory Visit (INDEPENDENT_AMBULATORY_CARE_PROVIDER_SITE_OTHER): Payer: Medicare PPO

## 2021-07-30 VITALS — BP 156/76 | HR 55 | Resp 17 | Ht 67.0 in | Wt 202.0 lb

## 2021-07-30 DIAGNOSIS — M79641 Pain in right hand: Secondary | ICD-10-CM

## 2021-07-30 DIAGNOSIS — R768 Other specified abnormal immunological findings in serum: Secondary | ICD-10-CM | POA: Insufficient documentation

## 2021-07-30 DIAGNOSIS — M79642 Pain in left hand: Secondary | ICD-10-CM | POA: Diagnosis not present

## 2021-07-30 NOTE — Progress Notes (Unsigned)
Office Visit Note  Patient: Stephen Moses             Date of Birth: August 11, 1945           MRN: [id number]             PCP: [name], MD Referring: Tanda Rockers, MD Visit Date: 07/30/2021   Subjective:  New Patient (Initial Visit) (Abnormal labs, right 4th digit locking, left 4th digit locking)   History of Present Illness: Stephen Moses is a 76 y.o. male here for evaluation of positive rheumatoid factor in association with recently diagnosed pulmonary fibrosis. ***   2-3 months increase hand pains***  Labs reviewed 05/2021 ANA pos RF 38.6 CCP neg ESR 2  05/19/21 CT Chest IMPRESSION: 1. Within limitations of noncontrast exam, stable appearance of prosthetic aortic valve and ascending aortic graft. 2. Stable aneurysmal dilation of the proximal aortic arch. Recommend semi-annual imaging followup by CTA or MRA and referral to cardiothoracic surgery if not already obtained. This recommendation follows 2010 ACCF/AHA/AATS/ACR/ASA/SCA/SCAI/SIR/STS/SVM Guidelines for the Diagnosis and Management of Patients With Thoracic Aortic Disease. Circulation. 2010; 121: C588-F02. Aortic aneurysm NOS (ICD10-I71.9) 3. Mild basilar predominant fibrotic changes, no evidence progression when compared to prior exam. Compatible with combined pulmonary fibrosis and emphysema. 4. Aortic Atherosclerosis (ICD10-I70.0) and Emphysema (ICD10-J43.9).  Activities of Daily Living:  Patient reports morning stiffness for 2 hours.   Patient Reports nocturnal pain.  Difficulty dressing/grooming: Denies Difficulty climbing stairs: Reports Difficulty getting out of chair: Reports Difficulty using hands for taps, buttons, cutlery, and/or writing: Denies  Review of Systems  Constitutional:  Negative for fatigue.  HENT:  Negative for mouth dryness.   Eyes:  Negative for dryness.  Respiratory:  Positive for shortness of breath.   Cardiovascular:  Positive for swelling in legs/feet.  Gastrointestinal:   Negative for constipation.  Endocrine: Negative for excessive thirst.  Genitourinary:  Negative for difficulty urinating.  Musculoskeletal:  Positive for joint pain, joint pain and morning stiffness.  Skin:  Negative for rash.  Allergic/Immunologic: Negative for susceptible to infections.  Neurological:  Negative for numbness.  Hematological:  Positive for bruising/bleeding tendency.  Psychiatric/Behavioral:  Negative for sleep disturbance.     PMFS History:  Patient Active Problem List   Diagnosis Date Noted   Rheumatoid factor positive 07/30/2021   Bilateral hand pain 07/30/2021   IPF (idiopathic pulmonary fibrosis) ([place]) 06/04/2021   Rectal bleeding 05/29/2016   Diverticulitis 05/29/2016   Normocytic anemia 05/18/2015   Thrombocytopenia ([place]) 05/18/2015   Leukocytosis 05/17/2015   CAP (community acquired pneumonia) 03/30/2014   Acute bronchitis 03/30/2014   Essential hypertension 03/30/2014   Acute kidney injury ([place]) 03/30/2014   Lung density on x-ray 03/30/2014   Encounter for therapeutic drug monitoring 03/28/2013   Ascending aortic aneurysm ([place]) 09/16/2011   Aortic valve disorder 05/30/2010   Long term current use of anticoagulant 05/30/2010   SYNCOPE 07/06/2009   DIZZINESS 07/06/2009   PALPITATIONS 07/06/2009   H/O aortic valve replacement 07/06/2009   HYPERTENSION 08/08/2008   RENAL CALCULUS 08/08/2008    Past Medical History:  Diagnosis Date   Allergy    Ascending aortic aneurysm ([place]) 2010    23-mm St. Jude mechanical valve conduit and re-implantation of the coronary arteries   Bicuspid aortic valve    Calculus of kidney    Essential hypertension    Long term current use of anticoagulant    Normocytic anemia    Pancytopenia ([facility])    Paraspinal muscle spasm  Family History  Problem Relation Age of Onset   Leukemia Sister    Leukemia Brother    Heart attack Brother    Lung cancer Brother    Colon cancer Brother        less than age 65.    Past  Surgical History:  Procedure Laterality Date   BACK SURGERY  2009   CARDIAC VALVE REPLACEMENT  2010   COLONOSCOPY     COLONOSCOPY N/A 10/16/2014   Dr. Laural Golden: two colon polyps (tubular adenomas), scattered diverticula at sigmoid, small hemorrhoids above and below dentate line, surveillance due in 2021.    COLONOSCOPY N/A 03/12/2020   Procedure: COLONOSCOPY;  Surgeon: Rogene Houston, MD;  Location: [facility];  Service: Endoscopy;  Laterality: N/A;  8:00   EYE SURGERY     Left rotator cuff repair     POLYPECTOMY  03/12/2020   Procedure: POLYPECTOMY;  Surgeon: Rogene Houston, MD;  Location: [facility];  Service: Endoscopy;;   SHOULDER SURGERY Bilateral    Social History   Social History Narrative   ** Merged History Encounter **       Immunization History  Administered Date(s) Administered   Influenza, High Dose Seasonal PF 10/23/2018   Moderna Sars-Covid-2 Vaccination 03/18/2019, 04/15/2019, 12/17/2019     Objective: Vital Signs: BP (!) 156/76 (BP Location: Right Arm, Patient Position: Sitting, Cuff Size: Normal)   Pulse (!) 55   Resp 17   Ht 5' 7"  (1.702 m)   Wt 202 lb (91.6 kg)   BMI 31.64 kg/m    Physical Exam HENT:     Ears:     Comments: Hearing aids Cardiovascular:     Rate and Rhythm: Normal rate and regular rhythm.     Comments: Mechanical valve Pulmonary:     Effort: Pulmonary effort is normal.     Comments: Faint bibasilar inspiratory crackles Musculoskeletal:     Right lower leg: Edema present.     Left lower leg: Edema present.  Lymphadenopathy:     Cervical: No cervical adenopathy.  Skin:    General: Skin is warm and dry.  Neurological:     Mental Status: He is alert.  Psychiatric:        Mood and Affect: Mood normal.      Musculoskeletal Exam:  Neck full ROM no tenderness Shoulders decreased external rotation b/l, left shoulder lateral tenderness and pain with external rotation while 90 degrees abducted Elbows full ROM no tenderness  or swelling Wrists full ROM no tenderness or swelling Fingers full ROM no tenderness or swelling, heberdon's nodes present, reducible deviation of MCP joints, 2nd and 3rd DIPs deviated, right 4th finger triggering at PIP Knees full ROM no tenderness or swelling, crepitus present   Investigation: No additional findings.  Imaging: No results found.  Recent Labs: Lab Results  Component Value Date   WBC 8.1 12/24/2019   HGB 14.0 12/24/2019   PLT 170 12/24/2019   NA 133 (L) 12/24/2019   K 4.3 12/24/2019   CL 103 12/24/2019   CO2 23 12/24/2019   GLUCOSE 111 (H) 12/24/2019   BUN 32 (H) 12/24/2019   CREATININE 1.90 (H) 04/20/2020   BILITOT 0.4 05/30/2016   ALKPHOS 49 05/30/2016   AST 25 05/30/2016   ALT 19 05/30/2016   PROT 7.0 05/30/2016   ALBUMIN 3.7 05/30/2016   CALCIUM 8.4 (L) 12/24/2019   GFRAA 43 (L) 01/18/2017    Speciality Comments: No specialty comments available.  Procedures:  No  procedures performed Allergies: Codeine   Assessment / Plan:     Visit Diagnoses: Positive ANA (antinuclear antibody) - Plan: ANA, RNP Antibody, Anti-Smith antibody, Anti-DNA antibody, double-stranded, Sjogrens syndrome-A extractable nuclear antibody  Rheumatoid factor positive  Bilateral hand pain - Plan: XR Hand 2 View Right, XR Hand 2 View Left  Orders: Orders Placed This Encounter  Procedures   XR Hand 2 View Right   XR Hand 2 View Left   ANA   RNP Antibody   Anti-Smith antibody   Anti-DNA antibody, double-stranded   Sjogrens syndrome-A extractable nuclear antibody   No orders of the defined types were placed in this encounter.   Face-to-face time spent with patient was *** minutes. Greater than 50% of time was spent in counseling and coordination of care.  Follow-Up Instructions: No follow-ups on file.   Collier Salina, MD  Note - This record has been created using [employer].  Chart creation errors have been sought, but may not always  have been located.  Such creation errors do not reflect on  the standard of medical care.

## 2021-07-31 LAB — RNP ANTIBODY: Ribonucleic Protein(ENA) Antibody, IgG: <1 AI

## 2021-07-31 LAB — ANTI-SMITH ANTIBODY: ENA SM Ab Ser-aCnc: <1 AI

## 2021-07-31 LAB — ANTI-DNA ANTIBODY, DOUBLE-STRANDED: ds DNA Ab: 8 IU/mL — ABNORMAL HIGH

## 2021-07-31 LAB — SJOGRENS SYNDROME-A EXTRACTABLE NUCLEAR ANTIBODY: SSA (Ro) (ENA) Antibody, IgG: <1 AI

## 2021-07-31 LAB — ANA: Anti Nuclear Antibody (ANA): NEGATIVE

## 2021-08-19 ENCOUNTER — Telehealth: Payer: Self-pay | Admitting: Internal Medicine

## 2021-08-19 DIAGNOSIS — D6869 Other thrombophilia: Secondary | ICD-10-CM | POA: Diagnosis not present

## 2021-08-19 DIAGNOSIS — I10 Essential (primary) hypertension: Secondary | ICD-10-CM | POA: Diagnosis not present

## 2021-08-19 DIAGNOSIS — N1832 Chronic kidney disease, stage 3b: Secondary | ICD-10-CM | POA: Diagnosis not present

## 2021-08-19 DIAGNOSIS — Z79899 Other long term (current) drug therapy: Secondary | ICD-10-CM | POA: Diagnosis not present

## 2021-08-19 NOTE — Telephone Encounter (Signed)
Patient called the office stating he had an appointment with Dr. Benjamine Mola a few weeks ago and was told he would reach out with lab results and next steps. Patient states no one has reached out and requests a return call.

## 2021-08-23 NOTE — Telephone Encounter (Signed)
Patient called requesting lab results and x-rays results.

## 2021-08-25 ENCOUNTER — Ambulatory Visit (INDEPENDENT_AMBULATORY_CARE_PROVIDER_SITE_OTHER): Payer: Medicare PPO | Admitting: *Deleted

## 2021-08-25 DIAGNOSIS — Z5181 Encounter for therapeutic drug level monitoring: Secondary | ICD-10-CM | POA: Diagnosis not present

## 2021-08-25 DIAGNOSIS — Z952 Presence of prosthetic heart valve: Secondary | ICD-10-CM | POA: Diagnosis not present

## 2021-08-25 LAB — POCT INR: INR: 3.1 — AB (ref 2.0–3.0)

## 2021-08-25 NOTE — Telephone Encounter (Signed)
I spoke with Stephen Moses results were negative for ANA on repeat test, specific antibody markers negative or equivocal. His xrays are consistent with primarily osteoarthritis changes. He notices improvement in morning finger pains with use of a finger brace for trigger fingers. I recommend he continue current plan without new changes at this time. If he does not get a satisfactory improvement or symptoms worsening despite 2-3 months we can follow up PRN and would probably try local steroid injections as next step.

## 2021-08-25 NOTE — Patient Instructions (Signed)
Continue warfarin 1 tablet daily except 1/2 tablet on Sundays, Tuesdays and Thursdays Eat greens tonight Recheck in 6 weeks

## 2021-08-26 DIAGNOSIS — I10 Essential (primary) hypertension: Secondary | ICD-10-CM | POA: Diagnosis not present

## 2021-08-26 DIAGNOSIS — J8410 Pulmonary fibrosis, unspecified: Secondary | ICD-10-CM | POA: Diagnosis not present

## 2021-08-26 DIAGNOSIS — N1832 Chronic kidney disease, stage 3b: Secondary | ICD-10-CM | POA: Diagnosis not present

## 2021-08-29 NOTE — Progress Notes (Unsigned)
Stephen Moses, male    DOB: 27-Mar-1945,    MRN: [id number]   Brief patient profile:  36  yowm  referred to pulmonary clinic in [place]  06/04/2021 by Stephen Moses  for PF  s/p amiodarone exp 2012 ? 6 m    05/12/19  Stephen Moses eval for PF min progression since 05/2015 but Probable Mild  UIP   History of Present Illness  06/04/2021  Pulmonary/ 1st office eval/ Stephen Moses / [facility]  Chief Complaint  Patient presents with   New Patient (Initial Visit)    SOB was seeing Dr. Shearon Moses   Dyspnea:  mostly with carrying heavy objects / able to walk to end of drive way and back up hill to house and feels it's a bit more difficult than prior years but can still do s stopping Cough: slt hoarse min cough/ attributes to overt HB no GI rx  Sleep: flat one pillow / overt gerd symptoms worse  flat SABA use: no hfa  Rec Nexium 40 mg   Take  30-60 min before first meal of the day and Pepcid (famotidine)  20 mg one hour before bedtime until return to office  GERD diet Make sure you check your oxygen saturation  AT  your highest level of activity (not after you stop)   to be sure it stays over 90%   Please remember to go to the lab department   > pos RA / ana > referred to Rheumatology   >  DJD    Please schedule a follow up visit in 3 months but call sooner if needed with PFTs    09/06/2021  f/u ov/[name] re: PF   maint on gerd rx  though still sleeping flat Chief Complaint  Patient presents with   Follow-up    No changes since last ov.   Dyspnea:  toting stuff =  40 lbs bucket / no trouble weed eating 30-40 mn Can't do treadmill or bicycle due to back.  Cough:  minimal hoarse no overt gerd  Sleeping: bed is level /flat and one pillow  SABA use: none  02: none  Covid status:   vax x all/ infected winter of 2022    No obvious day to day or daytime variability or assoc excess/ purulent sputum or mucus plugs or hemoptysis or cp or chest tightness, subjective wheeze or overt sinus or hb symptoms.   sleeping  without nocturnal  or early am exacerbation  of respiratory  c/o's or need for noct saba. Also denies any obvious fluctuation of symptoms with weather or environmental changes or other aggravating or alleviating factors except as outlined above   No unusual exposure hx or h/o childhood pna/ asthma or knowledge of premature birth.  Current Allergies, Complete Past Medical History, Past Surgical History, Family History, and Social History were reviewed in [employer] record.  ROS  The following are not active complaints unless bolded Hoarseness, sore throat, dysphagia, dental problems, itching, sneezing,  nasal congestion or discharge of excess mucus or purulent secretions, ear ache,   fever, chills, sweats, unintended wt loss or wt gain, classically pleuritic or exertional cp,  orthopnea pnd or arm/hand swelling  or leg swelling, presyncope, palpitations, abdominal pain, anorexia, nausea, vomiting, diarrhea  or change in bowel habits or change in bladder habits, change in stools or change in urine, dysuria, hematuria,  rash, arthralgias, visual complaints, headache, numbness, weakness or ataxia or problems with walking or coordination,  change in mood or  memory.        Current Meds  Medication Sig   acetaminophen (TYLENOL) 500 MG tablet Take 1,000 mg by mouth every 6 (six) hours as needed for mild pain or moderate pain.   amLODipine (NORVASC) 5 MG tablet Take 5 mg by mouth daily in the afternoon.   atorvastatin (LIPITOR) 40 MG tablet TAKE ONE (1) TABLET BY MOUTH EVERY DAY   esomeprazole (NEXIUM) 40 MG capsule Take 30-60 min before first meal of the day   famotidine (PEPCID) 20 MG tablet One after supper   metoprolol succinate (TOPROL-XL) 25 MG 24 hr tablet Take 25 mg by mouth at bedtime.    warfarin (COUMADIN) 5 MG tablet TAKE ONE (1) TABLET DAILY EXCEPT ONE HALF (1/2) TABLET ON SUNDAYS, TUESDAYS AND THURSDAYS OR AS DIRECTED             Past Medical History:  Diagnosis  Date   Ascending aortic aneurysm ([place]) 2010    23-mm St. Jude mechanical valve conduit and re-implantation of the coronary arteries   Bicuspid aortic valve    Calculus of kidney    Essential hypertension    Long term current use of anticoagulant    Normocytic anemia    Pancytopenia (HCC)    Paraspinal muscle spasm        Objective:      Wt Readings from Last 3 Encounters:  09/06/21 202 lb 12.8 oz (92 kg)  07/30/21 202 lb (91.6 kg)  06/04/21 204 lb 6.4 oz (92.7 kg)      Vital signs reviewed  09/06/2021  - Note at rest 02 sats  97% on RA   General appearance:    hoarse pleasant wm nad     HEENT : Oropharynx  clear      Nasal turbinates nl    NECK :  without  apparent JVD/ palpable Nodes/TM    LUNGS: no acc muscle use,  Nl contour chest with mild basilar insp crackles bilaterally without cough on insp or exp maneuvers   CV:  RRR  mechanical S2 no s3 or murmur or increase in P2, and no edema   ABD:  soft and nontender with nl inspiratory excursion in the supine position. No bruits or organomegaly appreciated   MS:  Nl gait/ ext warm without deformities Or obvious joint restrictions  calf tenderness, cyanosis or clubbing    SKIN: warm and dry without lesions    NEURO:  alert, approp, nl sensorium with  no motor or cerebellar deficits apparent.         Assessment

## 2021-09-06 ENCOUNTER — Encounter: Payer: Self-pay | Admitting: Internal Medicine

## 2021-09-06 ENCOUNTER — Ambulatory Visit: Payer: Medicare PPO | Admitting: Internal Medicine

## 2021-09-06 DIAGNOSIS — J84112 Idiopathic pulmonary fibrosis: Secondary | ICD-10-CM

## 2021-09-06 NOTE — Patient Instructions (Signed)
Make sure you check your oxygen saturation at your highest level of activity to be sure it stays over 90% and keep track of it at least once a week, more often if breathing getting worse, and let me know if losing ground.   Bed blocks x 6-8 inches   Please schedule a follow up visit in 6 months but call sooner if needed

## 2021-09-07 ENCOUNTER — Encounter: Payer: Self-pay | Admitting: Internal Medicine

## 2021-09-07 NOTE — Assessment & Plan Note (Signed)
Onset of symptoms around 2016/17 - HRCT 03/25/19  prob mild UIP   - CT chest s contrast 05/19/21 Mild basilar predominant fibrotic changes, no evidence progression when compared to prior exam. Compatible with combined pulmonary fibrosis and emphysema. - 06/04/2021   Walked on RA  x  3  lap(s) =  approx 450  ft  @ mod/cane pace, stopped due to end of study with lowest 02 sats 92% s sob  - 06/04/2021   HSP serology  Neg - 06/04/21  Collagen vasc profile  ESR 10 but RA and ANA pos> rheum eval completed Dr Benjamine Mola  07/30/21 neg x for DJD - PFT's  07/27/21   FVC 2.81(71%)  DLCO  13.2  (55%)   FV curve nl and ERV 56% at wt 204     - 09/06/2021   Walked on RA  x  3  lap(s) =  approx 450  ft  @ rapid pace, stopped due to end of study with lowest 02 sats 93%  s sob   No clinical or radiographic evidence of dz progression x sev years and pt not inclined to do PF clinic/ antifibrotics which is fine with me as long as continues to minitor sats at peak ex to "connect the dots" and create hopefully a relatively stable curve over time/ reviewed with pt and wife.  In meantime rec max rx for gerd including bed blocks and f/u q 6 m, sooner prn   Discussed in detail all the  indications, usual  risks and alternatives  relative to the benefits with patient who agrees to proceed with Rx as outlined.      Each maintenance medication was reviewed in detail including emphasizing most importantly the difference between maintenance and prns and under what circumstances the prns are to be triggered using an action plan format where appropriate.  Total time for H and P, chart review, counseling,  directly observing portions of ambulatory 02 saturation study/ and generating customized AVS unique to this office visit / same day charting = 34 min

## 2021-10-06 ENCOUNTER — Ambulatory Visit (INDEPENDENT_AMBULATORY_CARE_PROVIDER_SITE_OTHER): Payer: Medicare PPO | Admitting: *Deleted

## 2021-10-06 DIAGNOSIS — Z5181 Encounter for therapeutic drug level monitoring: Secondary | ICD-10-CM | POA: Diagnosis not present

## 2021-10-06 DIAGNOSIS — Z952 Presence of prosthetic heart valve: Secondary | ICD-10-CM

## 2021-10-06 LAB — POCT INR
INR: 2.3 (ref 2.0–3.0)
INR: 2.3 (ref 2.0–3.0)

## 2021-10-06 NOTE — Patient Instructions (Signed)
Continue warfarin 1 tablet daily except 1/2 tablet on Sundays, Tuesdays and Thursdays Eat greens tonight Recheck in 6 weeks

## 2021-11-15 DIAGNOSIS — Z85828 Personal history of other malignant neoplasm of skin: Secondary | ICD-10-CM | POA: Diagnosis not present

## 2021-11-15 DIAGNOSIS — Z85820 Personal history of malignant melanoma of skin: Secondary | ICD-10-CM | POA: Diagnosis not present

## 2021-11-15 DIAGNOSIS — L570 Actinic keratosis: Secondary | ICD-10-CM | POA: Diagnosis not present

## 2021-11-17 ENCOUNTER — Ambulatory Visit: Payer: Medicare PPO | Attending: Cardiology | Admitting: *Deleted

## 2021-11-17 DIAGNOSIS — Z952 Presence of prosthetic heart valve: Secondary | ICD-10-CM

## 2021-11-17 DIAGNOSIS — Z5181 Encounter for therapeutic drug level monitoring: Secondary | ICD-10-CM

## 2021-11-17 DIAGNOSIS — I359 Nonrheumatic aortic valve disorder, unspecified: Secondary | ICD-10-CM

## 2021-11-17 LAB — POCT INR: INR: 3 (ref 2.0–3.0)

## 2021-11-17 NOTE — Patient Instructions (Signed)
Continue warfarin 1 tablet daily except 1/2 tablet on Sundays, Tuesdays and Thursdays Continue greens Recheck in 6 weeks 

## 2021-11-25 DIAGNOSIS — K5900 Constipation, unspecified: Secondary | ICD-10-CM | POA: Diagnosis not present

## 2021-11-25 DIAGNOSIS — Z23 Encounter for immunization: Secondary | ICD-10-CM | POA: Diagnosis not present

## 2021-12-20 DIAGNOSIS — N200 Calculus of kidney: Secondary | ICD-10-CM | POA: Diagnosis not present

## 2021-12-20 DIAGNOSIS — M5136 Other intervertebral disc degeneration, lumbar region: Secondary | ICD-10-CM | POA: Diagnosis not present

## 2021-12-20 DIAGNOSIS — R7301 Impaired fasting glucose: Secondary | ICD-10-CM | POA: Diagnosis not present

## 2021-12-27 DIAGNOSIS — I712 Thoracic aortic aneurysm, without rupture, unspecified: Secondary | ICD-10-CM | POA: Diagnosis not present

## 2021-12-27 DIAGNOSIS — I10 Essential (primary) hypertension: Secondary | ICD-10-CM | POA: Diagnosis not present

## 2021-12-27 DIAGNOSIS — N1832 Chronic kidney disease, stage 3b: Secondary | ICD-10-CM | POA: Diagnosis not present

## 2021-12-27 DIAGNOSIS — J8410 Pulmonary fibrosis, unspecified: Secondary | ICD-10-CM | POA: Diagnosis not present

## 2021-12-27 DIAGNOSIS — E785 Hyperlipidemia, unspecified: Secondary | ICD-10-CM | POA: Diagnosis not present

## 2021-12-27 DIAGNOSIS — D6869 Other thrombophilia: Secondary | ICD-10-CM | POA: Diagnosis not present

## 2021-12-27 DIAGNOSIS — I359 Nonrheumatic aortic valve disorder, unspecified: Secondary | ICD-10-CM | POA: Diagnosis not present

## 2021-12-27 DIAGNOSIS — I493 Ventricular premature depolarization: Secondary | ICD-10-CM | POA: Diagnosis not present

## 2021-12-29 ENCOUNTER — Ambulatory Visit: Payer: Medicare PPO | Attending: Cardiology | Admitting: *Deleted

## 2021-12-29 DIAGNOSIS — Z5181 Encounter for therapeutic drug level monitoring: Secondary | ICD-10-CM

## 2021-12-29 DIAGNOSIS — Z952 Presence of prosthetic heart valve: Secondary | ICD-10-CM | POA: Diagnosis not present

## 2021-12-29 LAB — POCT INR: INR: 3.3 — AB (ref 2.0–3.0)

## 2021-12-29 NOTE — Patient Instructions (Signed)
Take warfarin 1/2 tablet tonight then resume 1 tablet daily except 1/2 tablet on Sundays, Tuesdays and Thursdays Increase greens Recheck in 6 weeks

## 2022-01-10 ENCOUNTER — Other Ambulatory Visit: Payer: Self-pay | Admitting: Cardiology

## 2022-02-10 ENCOUNTER — Ambulatory Visit: Payer: Medicare PPO | Attending: Cardiology | Admitting: *Deleted

## 2022-02-10 DIAGNOSIS — Z952 Presence of prosthetic heart valve: Secondary | ICD-10-CM | POA: Diagnosis not present

## 2022-02-10 DIAGNOSIS — Z5181 Encounter for therapeutic drug level monitoring: Secondary | ICD-10-CM

## 2022-02-10 LAB — POCT INR: POC INR: 2.8

## 2022-02-10 NOTE — Patient Instructions (Signed)
Description   Continue taking 1 tablet daily except 1/2 tablet on Sundays, Tuesdays and Thursdays.  Recheck in 6 weeks       

## 2022-03-02 NOTE — Progress Notes (Unsigned)
Cardiology Office Note  Date: 03/03/2022   ID: Stephen, Moses 07/13/1945, MRN [id number]  PCP:  [name], MD  Cardiologist:  Rozann Lesches, MD Electrophysiologist:  None   Chief Complaint  Patient presents with   Cardiac follow-up    History of Present Illness: Stephen Moses is a 77 y.o. male last seen in January 2023.  He is here for a routine visit.  Reports no major change in status.  He is not able to exercise as regularly due to chronic back pain however.  Still functional with ADLs and outside chores.  He continues to follow in the anticoagulation clinic.  Last INR 2.8.  He does not report any spontaneous bleeding problems.  He continues to follow with Dr. Cyndia Bent, chest CTA from March 2023 noted below.  He will have a follow-up visit in March of this year.  I rechecked his blood pressure today at 132/80.  We went over his medications, he reports compliance with therapy.  His last echocardiogram was in 2019.  We discussed getting an updated study.  Past Medical History:  Diagnosis Date   Allergy    Ascending aortic aneurysm ([place]) 2010    23-mm St. Jude mechanical valve conduit and re-implantation of the coronary arteries   Bicuspid aortic valve    Calculus of kidney    Essential hypertension    Long term current use of anticoagulant    Normocytic anemia    Pancytopenia (HCC)    Paraspinal muscle spasm     Current Outpatient Medications  Medication Sig Dispense Refill   acetaminophen (TYLENOL) 500 MG tablet Take 1,000 mg by mouth every 6 (six) hours as needed for mild pain or moderate pain.     amLODipine (NORVASC) 5 MG tablet Take 5 mg by mouth daily in the afternoon.     atorvastatin (LIPITOR) 40 MG tablet TAKE ONE (1) TABLET BY MOUTH EVERY DAY 90 tablet 1   esomeprazole (NEXIUM) 40 MG capsule Take 30-60 min before first meal of the day 30 capsule 11   famotidine (PEPCID) 20 MG tablet One after supper 30 tablet 11   metoprolol succinate (TOPROL-XL)  25 MG 24 hr tablet Take 25 mg by mouth at bedtime.      warfarin (COUMADIN) 5 MG tablet TAKE ONE (1) TABLET DAILY EXCEPT ONE HALF (1/2) TABLET ON SUNDAYS, TUESDAYS AND THURSDAYS OR AS DIRECTED 90 tablet 4   No current facility-administered medications for this visit.   Allergies:  Codeine   ROS: No orthopnea or PND.  Physical Exam: VS:  BP 132/80   Pulse 75   Ht '5\' 8"'$  (1.727 m)   Wt 205 lb 9.6 oz (93.3 kg)   SpO2 96%   BMI 31.26 kg/m , BMI Body mass index is 31.26 kg/m.  Wt Readings from Last 3 Encounters:  03/03/22 205 lb 9.6 oz (93.3 kg)  09/06/21 202 lb 12.8 oz (92 kg)  07/30/21 202 lb (91.6 kg)    General: Patient appears comfortable at rest. HEENT: Conjunctiva and lids normal. Neck: Supple, no elevated JVP or carotid bruits. Lungs: Clear to auscultation, nonlabored breathing at rest. Cardiac: Regular rate and rhythm, no S3, 2/6 systolic murmur, loud and crisp prosthetic click in S2, no pericardial rub. Extremities: No pitting edema.  ECG:  An ECG dated 12/27/2021 was personally reviewed today and demonstrated:  Sinus rhythm with prolonged PR interval and PVCs.  Recent Labwork:  October 2023: BUN 24, creatinine 1.94, potassium 4.2, AST 29, ALT 21,  hemoglobin 14.4, platelets 221, cholesterol 136, triglycerides 100, HDL 51, LDL 66  Other Studies Reviewed Today:  Echocardiogram 02/07/2018: - Left ventricle: The cavity size was normal. Wall thickness was    increased in a pattern of moderate LVH. Systolic function was    normal. The estimated ejection fraction was in the range of 55%    to 60%. Wall motion was normal; there were no regional wall    motion abnormalities. Doppler parameters are consistent with    abnormal left ventricular relaxation (grade 1 diastolic    dysfunction).  - Aortic valve: There is a 23 mm St Jude mechanical valve is    present in the AV position. Mildly calcified annulus. Mildly    thickened leaflets. There was no stenosis. There was no     significant regurgitation. Mean gradient (S): 15 mm Hg.  - Mitral valve: Mildly calcified annulus. Mildly thickened leaflets.   Chest CT 05/19/2021: IMPRESSION: 1. Within limitations of noncontrast exam, stable appearance of prosthetic aortic valve and ascending aortic graft. 2. Stable aneurysmal dilation of the proximal aortic arch. Recommend semi-annual imaging followup by CTA or MRA and referral to cardiothoracic surgery if not already obtained. This recommendation follows 2010 ACCF/AHA/AATS/ACR/ASA/SCA/SCAI/SIR/STS/SVM Guidelines for the Diagnosis and Management of Patients With Thoracic Aortic Disease. Circulation. 2010; 121: X540-G86. Aortic aneurysm NOS (ICD10-I71.9) 3. Mild basilar predominant fibrotic changes, no evidence progression when compared to prior exam. Compatible with combined pulmonary fibrosis and emphysema. 4. Aortic Atherosclerosis (ICD10-I70.0) and Emphysema (ICD10-J43.9).  Assessment and Plan:  1.  History of bicuspid aortic valve with stenosis status post St. Jude mechanical AVR with reimplantation of coronaries in 2010.  He is clinically stable, NYHA class II dyspnea.  He remains compliant with Coumadin and follow-up in the anticoagulation clinic.  Plan to check a follow-up echocardiogram in comparison to last study from 2019.  2.  Ascending thoracic aortic aneurysm, stable by chest CTA in March 2023.  Maximum diameter 4.9 cm.  He continues to follow regularly with Dr. Cyndia Bent.  3.  Essential hypertension, continue Norvasc and Toprol-XL with follow-up by Dr. Willey Blade.  4.  Mixed hyperlipidemia, continues on Lipitor with LDL 66.  Medication Adjustments/Labs and Tests Ordered: Current medicines are reviewed at length with the patient today.  Concerns regarding medicines are outlined above.   Tests Ordered: Orders Placed This Encounter  Procedures   ECHOCARDIOGRAM COMPLETE    Medication Changes: No orders of the defined types were placed in this  encounter.   Disposition:  Follow up  1 year.  Signed, Satira Sark, MD, [facility] 03/03/2022 10:55 AM    [facility]. [address], [place], [place] [address] Phone: [phone]; Fax: [phone]

## 2022-03-03 ENCOUNTER — Ambulatory Visit: Payer: Medicare PPO | Attending: Cardiology | Admitting: Cardiology

## 2022-03-03 ENCOUNTER — Encounter: Payer: Self-pay | Admitting: Cardiology

## 2022-03-03 VITALS — BP 132/80 | HR 75 | Ht 68.0 in | Wt 205.6 lb

## 2022-03-03 DIAGNOSIS — I7121 Aneurysm of the ascending aorta, without rupture: Secondary | ICD-10-CM | POA: Diagnosis not present

## 2022-03-03 DIAGNOSIS — Z952 Presence of prosthetic heart valve: Secondary | ICD-10-CM | POA: Diagnosis not present

## 2022-03-03 DIAGNOSIS — E782 Mixed hyperlipidemia: Secondary | ICD-10-CM | POA: Diagnosis not present

## 2022-03-03 DIAGNOSIS — I10 Essential (primary) hypertension: Secondary | ICD-10-CM

## 2022-03-03 NOTE — Patient Instructions (Signed)
Medication Instructions:  Your physician recommends that you continue on your current medications as directed. Please refer to the Current Medication list given to you today.  *If you need a refill on your cardiac medications before your next appointment, please call your pharmacy*   Lab Work: NONE   If you have labs (blood work) drawn today and your tests are completely normal, you will receive your results only by: [facility] (if you have MyChart) OR A paper copy in the mail If you have any lab test that is abnormal or we need to change your treatment, we will call you to review the results.   Testing/Procedures: Your physician has requested that you have an echocardiogram. Echocardiography is a painless test that uses sound waves to create images of your heart. It provides your doctor with information about the size and shape of your heart and how well your heart's chambers and valves are working. This procedure takes approximately one hour. There are no restrictions for this procedure. Please do NOT wear cologne, perfume, aftershave, or lotions (deodorant is allowed). Please arrive 15 minutes prior to your appointment time.    Follow-Up: At [facility], you and your health needs are our priority.  As part of our continuing mission to provide you with exceptional heart care, we have created designated Provider Care Teams.  These Care Teams include your primary Cardiologist (physician) and Advanced Practice Providers (APPs -  Physician Assistants and Nurse Practitioners) who all work together to provide you with the care you need, when you need it.  We recommend signing up for the patient portal called "MyChart".  Sign up information is provided on this After Visit Summary.  MyChart is used to connect with patients for Virtual Visits (Telemedicine).  Patients are able to view lab/test results, encounter notes, upcoming appointments, etc.  Non-urgent messages can be sent to  your provider as well.   To learn more about what you can do with MyChart, go to [email or web address].    Your next appointment:   1 year(s)  Provider:   Rozann Lesches, MD    Other Instructions Thank you for choosing [facility]!

## 2022-03-11 ENCOUNTER — Telehealth: Payer: Self-pay | Admitting: Internal Medicine

## 2022-03-11 ENCOUNTER — Encounter: Payer: Self-pay | Admitting: Internal Medicine

## 2022-03-11 ENCOUNTER — Ambulatory Visit (HOSPITAL_COMMUNITY)
Admission: RE | Admit: 2022-03-11 | Discharge: 2022-03-11 | Disposition: A | Discharge status: Home / Self Care | Payer: Medicare PPO | Source: Ambulatory Visit | Attending: Cardiology | Admitting: Cardiology

## 2022-03-11 ENCOUNTER — Ambulatory Visit: Payer: Medicare PPO | Admitting: Internal Medicine

## 2022-03-11 VITALS — BP 122/74 | HR 78 | Ht 68.0 in | Wt 203.4 lb

## 2022-03-11 DIAGNOSIS — Z952 Presence of prosthetic heart valve: Secondary | ICD-10-CM | POA: Insufficient documentation

## 2022-03-11 DIAGNOSIS — J84112 Idiopathic pulmonary fibrosis: Secondary | ICD-10-CM | POA: Diagnosis not present

## 2022-03-11 LAB — ECHOCARDIOGRAM COMPLETE
AV Mean grad: 12.9 mmHg
AV Peak grad: 23.7 mmHg
Ao pk vel: 2.43 m/s
Area-P 1/2: 2.6 cm2
Height: 68 in
S' Lateral: 3.5 cm
Weight: 3254.4 oz

## 2022-03-11 NOTE — Patient Instructions (Addendum)
Make sure you check your oxygen saturation at your highest level of activity(NOT after you stop)  to be sure it stays over 90% and keep track of it at least once a week, more often if breathing getting worse, and let me know if losing ground. (Collect the dots to connect the dots approach)    Please schedule a follow up visit in 9 months but call sooner if needed   Add pfts due 07/2021

## 2022-03-11 NOTE — Progress Notes (Signed)
*  PRELIMINARY RESULTS* Echocardiogram 2D Echocardiogram has been performed.  [name] 03/11/2022, 3:43 PM

## 2022-03-11 NOTE — Telephone Encounter (Signed)
After records review, rec return for PFT sometime in June 2024 to compare to June 2023 numbers

## 2022-03-11 NOTE — Progress Notes (Signed)
Stephen Moses, male    DOB: 01/10/1946,    MRN: [id number]   Brief patient profile:  41  yowm  quit smoking 1995 referred to pulmonary clinic in [facility]  06/04/2021 by Stephen Moses  for PF  s/p amiodarone exp 2012 ? 6 m    05/12/19  Stephen Moses eval for PF min progression since 05/2015 but Probable Mild  UIP   History of Present Illness  06/04/2021  Pulmonary/ 1st office eval/ Stephen Moses / [facility]  Chief Complaint  Patient presents with   New Patient (Initial Visit)    SOB was seeing Dr. Shearon Moses   Dyspnea:  mostly with carrying heavy objects / able to walk to end of drive way and back up hill to house and feels it's a bit more difficult than prior years but can still do s stopping Cough: slt hoarse min cough/ attributes to overt HB no GI rx  Sleep: flat one pillow / overt gerd symptoms worse  flat SABA use: no hfa  Rec Nexium 40 mg   Take  30-60 min before first meal of the day and Pepcid (famotidine)  20 mg one hour before bedtime until return to office  GERD diet Make sure you check your oxygen saturation  AT  your highest level of activity (not after you stop)   to be sure it stays over 90%   Please remember to go to the lab department   > pos RA / ana > referred to Rheumatology   >  DJD    Please schedule a follow up visit in 3 months but call sooner if needed with PFTs        03/11/2022  f/u ov/[facility]/Stephen Moses re: PF s/p amiodarone exp 2012 ? x 6 m  maint on  gerd rx  Chief Complaint  Patient presents with   Follow-up    Pt f/u states his breathing is the same, no concerns   Dyspnea:  more limited by back than breathing/ able to do yard work  Cough: minimal / assoc hoarse  Sleeping: bed flat bed one pillow  SABA use: inahlers  02: none      No obvious day to day or daytime variability or assoc excess/ purulent sputum or mucus plugs or hemoptysis or cp or chest tightness, subjective wheeze or overt sinus or hb symptoms.   Sleeping  without nocturnal  or early am  exacerbation  of respiratory  c/o's or need for noct saba. Also denies any obvious fluctuation of symptoms with weather or environmental changes or other aggravating or alleviating factors except as outlined above   No unusual exposure hx or h/o childhood pna/ asthma or knowledge of premature birth.  Current Allergies, Complete Past Medical History, Past Surgical History, Family History, and Social History were reviewed in [employer] record.  ROS  The following are not active complaints unless bolded Hoarseness, sore throat, dysphagia, dental problems, itching, sneezing,  nasal congestion or discharge of excess mucus or purulent secretions, ear ache,   fever, chills, sweats, unintended wt loss or wt gain, classically pleuritic or exertional cp,  orthopnea pnd or arm/hand swelling  or leg swelling, presyncope, palpitations, abdominal pain, anorexia, nausea, vomiting, diarrhea  or change in bowel habits or change in bladder habits, change in stools or change in urine, dysuria, hematuria,  rash, arthralgias, visual complaints, headache, numbness, weakness or ataxia or problems with walking or coordination,  change in mood or  memory.  Current Meds  Medication Sig   acetaminophen (TYLENOL) 500 MG tablet Take 1,000 mg by mouth every 6 (six) hours as needed for mild pain or moderate pain.   amLODipine (NORVASC) 5 MG tablet Take 5 mg by mouth daily in the afternoon.   atorvastatin (LIPITOR) 40 MG tablet TAKE ONE (1) TABLET BY MOUTH EVERY DAY   esomeprazole (NEXIUM) 40 MG capsule Take 30-60 min before first meal of the day   famotidine (PEPCID) 20 MG tablet One after supper   metoprolol succinate (TOPROL-XL) 25 MG 24 hr tablet Take 25 mg by mouth at bedtime.    warfarin (COUMADIN) 5 MG tablet TAKE ONE (1) TABLET DAILY EXCEPT ONE HALF (1/2) TABLET ON 'SUNDAYS, TUESDAYS AND THURSDAYS OR AS DIRECTED                 Past Medical History:  Diagnosis Date   Ascending aortic  aneurysm (HCC) 2010    23-mm St. Jude mechanical valve conduit and re-implantation of the coronary arteries   Bicuspid aortic valve    Calculus of kidney    Essential hypertension    Long term current use of anticoagulant    Normocytic anemia    Pancytopenia (HCC)    Paraspinal muscle spasm        Objective:      03/11/2022       20'$ 3   09/06/21 202 lb 12.8 oz (92 kg)  07/30/21 202 lb (91.6 kg)  06/04/21 204 lb 6.4 oz (92.7 kg)    Vital signs reviewed  03/11/2022  - Note at rest 02 sats  96% on RA   General appearance:    pleasant slt hoarse amb wm       HEENT : Oropharynx  clear   / edentulous      NECK :  without  apparent JVD/ palpable Nodes/TM    LUNGS: no acc muscle use,  Nl contour chest with mild insp crackles bilaterally without cough on insp or exp maneuvers   CV:  RRR  no s3  mechanical S2 s  increase in P2, and no edema   ABD:  soft and nontender with nl inspiratory excursion in the supine position. No bruits or organomegaly appreciated   MS:  Nl gait/ ext warm without deformities Or obvious joint restrictions  calf tenderness, cyanosis  - NO clubbing    SKIN: warm and dry without lesions    NEURO:  alert, approp, nl sensorium with  no motor or cerebellar deficits apparent.    Assessment

## 2022-03-11 NOTE — Assessment & Plan Note (Signed)
Onset of symptoms around 2016/17  s/p amiodarone exp 2012 x ? 6 m?   HRCT 03/25/19  prob mild UIP   - CT chest s contrast 05/19/21 Mild basilar predominant fibrotic changes, no evidence progression when compared to prior exam. Compatible with combined pulmonary fibrosis and emphysema. - 06/04/2021   Walked on RA  x  3  lap(s) =  approx 450  ft  @ mod/cane pace, stopped due to end of study with lowest 02 sats 92% s sob  - 06/04/2021   HSP serology  Neg - 06/04/21  Collagen vasc profile  ESR 10 but RA and ANA pos> rheum eval completed Dr Benjamine Mola  07/30/21 neg x for DJD - PFT's  07/27/21   FVC 2.81(71%)  DLCO  13.2  (55%)   FV curve nl and ERV 56% at wt 204     - 09/06/2021   Walked on RA  x  3  lap(s) =  approx 450  ft  @ rapid pace, stopped due to end of study with lowest 02 sats 93%  s sob  - 03/11/2022   Walked on RA  x  450  lap(s) =  approx 450  ft  @ fast pace, stopped due to end of study  with lowest 02 sats 92% RA    Clinically asymtpomatic and ex sats remain slt low but unchanged even at a tfast pace x 6 m so advised:  Make sure you check your oxygen saturation at your highest level of activity(NOT after you stop)  to be sure it stays over 90% and keep track of it at least once a week, more often if breathing getting worse, and let me know if losing ground. (Collect the dots to connect the dots approach)    F/u can be in 9 months then yearly, will need pfts in meantime in 07/2022 to compare to prior 07/2021   Discussed in detail all the  indications, usual  risks and alternatives  relative to the benefits with patient who agrees to proceed with conservative f/u as outlined    Each maintenance medication was reviewed in detail including emphasizing most importantly the difference between maintenance and prns and under what circumstances the prns are to be triggered using an action plan format where appropriate.  Total time for H and P, chart review, counseling,  directly observing portions of ambulatory 02  saturation study/ and generating customized AVS unique to this office visit / same day charting  > 30 min

## 2022-03-12 ENCOUNTER — Emergency Department (HOSPITAL_COMMUNITY)
Admission: EM | Admit: 2022-03-12 | Discharge: 2022-03-12 | Disposition: A | Discharge status: Home / Self Care | Payer: Medicare PPO | Attending: Emergency Medicine | Admitting: Emergency Medicine

## 2022-03-12 ENCOUNTER — Telehealth (HOSPITAL_COMMUNITY): Payer: Self-pay | Admitting: Emergency Medicine

## 2022-03-12 ENCOUNTER — Other Ambulatory Visit: Payer: Self-pay

## 2022-03-12 ENCOUNTER — Encounter (HOSPITAL_COMMUNITY): Payer: Self-pay

## 2022-03-12 DIAGNOSIS — Z79899 Other long term (current) drug therapy: Secondary | ICD-10-CM | POA: Insufficient documentation

## 2022-03-12 DIAGNOSIS — I10 Essential (primary) hypertension: Secondary | ICD-10-CM | POA: Diagnosis not present

## 2022-03-12 DIAGNOSIS — S46912A Strain of unspecified muscle, fascia and tendon at shoulder and upper arm level, left arm, initial encounter: Secondary | ICD-10-CM

## 2022-03-12 DIAGNOSIS — Z7901 Long term (current) use of anticoagulants: Secondary | ICD-10-CM

## 2022-03-12 DIAGNOSIS — S4992XA Unspecified injury of left shoulder and upper arm, initial encounter: Secondary | ICD-10-CM | POA: Diagnosis present

## 2022-03-12 DIAGNOSIS — W1840XA Slipping, tripping and stumbling without falling, unspecified, initial encounter: Secondary | ICD-10-CM | POA: Diagnosis not present

## 2022-03-12 MED ORDER — OXYCODONE-ACETAMINOPHEN 5-325 MG PO TABS: 1.0000 | ORAL_TABLET | ORAL | 0 refills | Status: DC | PRN

## 2022-03-12 MED ORDER — OXYCODONE-ACETAMINOPHEN 5-325 MG PO TABS
1.0000 | ORAL_TABLET | Freq: Once | ORAL | Status: AC
  Administered 2022-03-12: 1 via ORAL
  Filled 2022-03-12: qty 1

## 2022-03-12 MED ORDER — OXYCODONE-ACETAMINOPHEN 5-325 MG PO TABS: 1.0000 | ORAL_TABLET | Freq: Four times a day (QID) | ORAL | 0 refills | Status: DC | PRN

## 2022-03-12 NOTE — Discharge Instructions (Signed)
Apply ice for 30 minutes at a time, 4 times a day.  Use the sling as needed.  You may take acetaminophen as needed for pain, take oxycodone-acetaminophen as needed for pain not relieved by plain acetaminophen.  Please be careful not to take an overdose of acetaminophen.  You should take no more than 4000 mg of acetaminophen a day including the acetaminophen tablets and the acetaminophen and oxycodone-acetaminophen.

## 2022-03-12 NOTE — Telephone Encounter (Signed)
Patient called to make [place] aware that his prescribed Percocet 5-3 25 sent to [facility] yesterday is on backorder.  Patient would like his prescription changed and sent to [facility].  Nursing staff called to [facility].  Will attempt to send prescription to desired pharmacy.

## 2022-03-12 NOTE — ED Provider Notes (Signed)
[facility] Provider Note   CSN: [id number] Arrival date & time: 03/12/22  0153     History  Chief Complaint  Patient presents with   Arm Injury    Stephen Moses is a 77 y.o. male.  The history is provided by the patient.  Arm Injury He has history of hypertension, bicuspid aortic valve status post aortic valve replacement, ascending aortic aneurysm, chronic anticoagulation on warfarin and comes in because of pain in his left upper arm.  He was trying to push himself up to get out of the tub when he felt something pop in his left upper arm posteriorly.  Since then, he has had pain in that arm.  He has taken acetaminophen for pain without relief.   Home Medications Prior to Admission medications   Medication Sig Start Date End Date Taking? Authorizing Provider  oxyCODONE-acetaminophen (PERCOCET) 5-325 MG tablet Take 1 tablet by mouth every 4 (four) hours as needed for moderate pain. 08/25/86  Yes Delora Fuel, MD  oxyCODONE-acetaminophen (PERCOCET) 5-325 MG tablet Take 1 tablet by mouth every 4 (four) hours as needed for moderate pain. 8/91/69  Yes Delora Fuel, MD  acetaminophen (TYLENOL) 500 MG tablet Take 1,000 mg by mouth every 6 (six) hours as needed for mild pain or moderate pain.    [provider]  amLODipine (NORVASC) 5 MG tablet Take 5 mg by mouth daily in the afternoon.    [provider]  atorvastatin (LIPITOR) 40 MG tablet TAKE ONE (1) TABLET BY MOUTH EVERY DAY 01/10/22   Satira Sark, MD  esomeprazole (NEXIUM) 40 MG capsule Take 30-60 min before first meal of the day 06/04/21   Tanda Rockers, MD  famotidine (PEPCID) 20 MG tablet One after supper 06/04/21   Tanda Rockers, MD  metoprolol succinate (TOPROL-XL) 25 MG 24 hr tablet Take 25 mg by mouth at bedtime.  06/06/12   Fay Records, MD  warfarin (COUMADIN) 5 MG tablet TAKE ONE (1) TABLET DAILY EXCEPT ONE HALF (1/2) TABLET ON SUNDAYS, TUESDAYS AND  THURSDAYS OR AS DIRECTED 01/26/21   Satira Sark, MD      Allergies    Codeine    Review of Systems   Review of Systems  All other systems reviewed and are negative.   Physical Exam Updated Vital Signs BP (!) 138/90 (BP Location: Right Arm)   Pulse 82   Temp 98.2 F (36.8 C) (Oral)   Resp 20   Ht '5\' 8"'$  (1.727 m)   Wt 90.7 kg   SpO2 99%   BMI 30.41 kg/m  Physical Exam Vitals and nursing note reviewed.   77 year old male, resting comfortably and in no acute distress. Vital signs are normal. Oxygen saturation is 99%, which is normal. Head is normocephalic and atraumatic. PERRLA, EOMI. Oropharynx is clear. Neck is nontender and supple without adenopathy or JVD. Back is nontender and there is no CVA tenderness. Lungs are clear without rales, wheezes, or rhonchi. Chest is nontender. Heart has regular rate and rhythm without murmur.  Aortic valve click is noted. Abdomen is soft, flat, nontender. Extremities: There is no swelling or deformity noted, but there is tenderness to palpation in the region of the left triceps muscle.  There is pain elicited with passive flexion of the elbow and also elbow extension against resistance.  Distal neurovascular exam is intact. Skin is warm and dry without rash. Neurologic: Mental status is normal, cranial nerves are  intact, moves all extremities equally.  ED Results / Procedures / Treatments    Procedures Procedures  Cardiac monitor shows normal sinus rhythm, per my interpretation.  Medications Ordered in ED Medications  oxyCODONE-acetaminophen (PERCOCET/ROXICET) 5-325 MG per tablet 1 tablet (has no administration in time range)    ED Course/ Medical Decision Making/ A&P                             Medical Decision Making Risk Prescription drug management.   Muscle strain of the left triceps muscle.  No indication for imaging.  I have ordered a sling to use as needed for comfort and I am discharging him with prescription  for oxycodone-acetaminophen to use for pain not relieved by a plain acetaminophen.  I have referred him to orthopedics, I suspect he would benefit from physical therapy.  Final Clinical Impression(s) / ED Diagnoses Final diagnoses:  Muscle strain of left upper arm, initial encounter  Anticoagulated on warfarin    Rx / DC Orders ED Discharge Orders          Ordered    oxyCODONE-acetaminophen (PERCOCET) 5-325 MG tablet  Every 4 hours PRN        03/12/22 0244    oxyCODONE-acetaminophen (PERCOCET) 5-325 MG tablet  Every 4 hours PRN        03/12/22 8032              Delora Fuel, MD [birth year] [phone]

## 2022-03-12 NOTE — Telephone Encounter (Signed)
Safe call from [facility] that they also do not carry the medication that the patient was prescribed by different provider last night.  They were contacted and this prescription was canceled.  Patient called around the area and found a pharmacy that does carry the medicine.  Will attempt to prescribe this per previous prescription.

## 2022-03-12 NOTE — ED Triage Notes (Addendum)
Pt presents with left arm injury after pushing himself up with the left arm in the bath tub and it slipping. Now complaining of pain. No numbness and able to move fingers freely.

## 2022-03-14 MED FILL — Oxycodone w/ Acetaminophen Tab 5-325 MG: ORAL | Qty: 6 | Status: AC

## 2022-03-14 NOTE — Telephone Encounter (Signed)
PFT order placed and mychart message sent to patient with number to call to schedule.

## 2022-03-16 ENCOUNTER — Telehealth: Payer: Self-pay

## 2022-03-16 NOTE — Telephone Encounter (Signed)
     Patient  visit on 1/20  at [facility] you been able to follow up with your primary care physician? No   The patient was or was not able to obtain any needed medicine or equipment. Yes   Are there diet recommendations that you are having difficulty following? Na   Patient expresses understanding of discharge instructions and education provided has no other needs at this time.  Yes      [place], [facility], Care Management  [phone] [address], [place], [place] [address] Phone: [phone] Email: [email or web address]

## 2022-03-24 ENCOUNTER — Ambulatory Visit: Payer: Medicare PPO | Attending: Cardiology | Admitting: *Deleted

## 2022-03-24 DIAGNOSIS — Z952 Presence of prosthetic heart valve: Secondary | ICD-10-CM

## 2022-03-24 DIAGNOSIS — Z5181 Encounter for therapeutic drug level monitoring: Secondary | ICD-10-CM

## 2022-03-24 LAB — POCT INR: INR: 3 (ref 2.0–3.0)

## 2022-03-24 NOTE — Patient Instructions (Signed)
Continue taking 1 tablet daily except 1/2 tablet on Sundays, Tuesdays and Thursdays Recheck in 6 weeks

## 2022-03-25 ENCOUNTER — Other Ambulatory Visit: Payer: Self-pay | Admitting: Surgery

## 2022-03-25 DIAGNOSIS — I7121 Aneurysm of the ascending aorta, without rupture: Secondary | ICD-10-CM

## 2022-03-28 ENCOUNTER — Other Ambulatory Visit: Payer: Self-pay | Admitting: Internal Medicine

## 2022-04-07 ENCOUNTER — Other Ambulatory Visit: Payer: Self-pay | Admitting: Cardiology

## 2022-04-07 NOTE — Telephone Encounter (Signed)
Refill request for warfarin:  Last INR was 3.0 on 03/24/22 Next INR due on 05/05/22 LOV was 03/03/22    Refill approved.

## 2022-04-21 DIAGNOSIS — I10 Essential (primary) hypertension: Secondary | ICD-10-CM | POA: Diagnosis not present

## 2022-04-21 DIAGNOSIS — Z79899 Other long term (current) drug therapy: Secondary | ICD-10-CM | POA: Diagnosis not present

## 2022-04-21 DIAGNOSIS — N1832 Chronic kidney disease, stage 3b: Secondary | ICD-10-CM | POA: Diagnosis not present

## 2022-04-28 DIAGNOSIS — J8410 Pulmonary fibrosis, unspecified: Secondary | ICD-10-CM | POA: Diagnosis not present

## 2022-04-28 DIAGNOSIS — N1832 Chronic kidney disease, stage 3b: Secondary | ICD-10-CM | POA: Diagnosis not present

## 2022-04-28 DIAGNOSIS — I10 Essential (primary) hypertension: Secondary | ICD-10-CM | POA: Diagnosis not present

## 2022-05-02 DIAGNOSIS — H903 Sensorineural hearing loss, bilateral: Secondary | ICD-10-CM | POA: Diagnosis not present

## 2022-05-02 DIAGNOSIS — Z01118 Encounter for examination of ears and hearing with other abnormal findings: Secondary | ICD-10-CM | POA: Diagnosis not present

## 2022-05-03 DIAGNOSIS — L570 Actinic keratosis: Secondary | ICD-10-CM | POA: Diagnosis not present

## 2022-05-05 ENCOUNTER — Ambulatory Visit: Payer: Medicare PPO | Attending: Cardiology | Admitting: *Deleted

## 2022-05-05 DIAGNOSIS — Z952 Presence of prosthetic heart valve: Secondary | ICD-10-CM

## 2022-05-05 DIAGNOSIS — Z5181 Encounter for therapeutic drug level monitoring: Secondary | ICD-10-CM

## 2022-05-05 LAB — POCT INR: INR: 3.7 — AB (ref 2.0–3.0)

## 2022-05-05 NOTE — Patient Instructions (Signed)
Hold warfarin tonight then resume 1 tablet daily except 1/2 tablet on Sundays, Tuesdays and Thursdays Eat extra greens today Recheck in 3 weeks

## 2022-05-06 ENCOUNTER — Encounter: Payer: Self-pay | Admitting: Orthopedic Surgery

## 2022-05-06 ENCOUNTER — Other Ambulatory Visit (INDEPENDENT_AMBULATORY_CARE_PROVIDER_SITE_OTHER): Payer: Medicare PPO

## 2022-05-06 ENCOUNTER — Ambulatory Visit: Payer: Medicare PPO | Admitting: Orthopedic Surgery

## 2022-05-06 VITALS — BP 154/87 | HR 87 | Ht 68.0 in | Wt 203.0 lb

## 2022-05-06 DIAGNOSIS — M25512 Pain in left shoulder: Secondary | ICD-10-CM

## 2022-05-06 MED ORDER — METHYLPREDNISOLONE ACETATE 40 MG/ML IJ SUSP
40.0000 mg | Freq: Once | INTRAMUSCULAR | Status: AC
  Administered 2022-05-06: 40 mg via INTRA_ARTICULAR

## 2022-05-06 NOTE — Patient Instructions (Signed)

## 2022-05-07 ENCOUNTER — Encounter: Payer: Self-pay | Admitting: Orthopedic Surgery

## 2022-05-07 NOTE — Progress Notes (Signed)
New Patient Visit  Assessment: Stephen Moses is a 77 y.o. male with the following: 1. Acute pain of left shoulder  Plan: Stephen Moses has pain in his left shoulder.  He has pretty good range of motion.  Since the onset of the pain 2 months ago, he has noticed some gradual improvement.  However, he continues to have ongoing dysfunction.  He is unable to take NSAIDs as he is on chronic anticoagulation.  We discussed proceeding with an injection, and he elected proceed.  This was completed without issues in clinic today.  He will follow-up as needed.  Procedure note injection Left shoulder    Verbal consent was obtained to inject the left shoulder, subacromial space Timeout was completed to confirm the site of injection.  The skin was prepped with alcohol and ethyl chloride was sprayed at the injection site.  A 21-gauge needle was used to inject 40 mg of Depo-Medrol and 1% lidocaine (3 cc) into the subacromial space of the left shoulder using a posterolateral approach.  There were no complications. A sterile bandage was applied.   Follow-up: Return if symptoms worsen or fail to improve.  Subjective:  Chief Complaint  Patient presents with   New Patient (Initial Visit)   Shoulder Pain    LT shoulder/ RIGHT hand dominant DOI 03/11/22/ seen in ED on 03/12/22 Slipped while getting out of tub. Propped arm on side of tub to get out, arm slipped, and he hit his shoulder    History of Present Illness: Stephen Moses is a 77 y.o. male who has been referred by [name], MD for evaluation of left shoulder pain.  He has had pain in the left shoulder for the past 2 months.  He reports trying to stand up out of a bathtub.  He placed his left elbow on the edge of the bathtub, and as he pushed up, the left arm slipped.  He had immediate pain, and noted a popping sensation.  He was seen in the emergency department the following day.  Since then, he has had some improvement in his symptoms, but he  still has pain in the anterior and lateral aspect of the left shoulder.  He takes Tylenol occasionally.  He has tried some topical treatments.  Motion has improved, but still is difficulty with overhead motion.  He has not had an injection.  He has previously had arthroscopic surgery on his left shoulder.   Review of Systems: No fevers or chills No numbness or tingling No chest pain No shortness of breath No bowel or bladder dysfunction No GI distress No headaches   Medical History:  Past Medical History:  Diagnosis Date   Allergy    Ascending aortic aneurysm ([place]) 2010    23-mm St. Jude mechanical valve conduit and re-implantation of the coronary arteries   Bicuspid aortic valve    Calculus of kidney    Essential hypertension    Long term current use of anticoagulant    Normocytic anemia    Pancytopenia (HCC)    Paraspinal muscle spasm     Past Surgical History:  Procedure Laterality Date   BACK SURGERY  2009   CARDIAC VALVE REPLACEMENT  2010   COLONOSCOPY     COLONOSCOPY N/A 10/16/2014   Dr. Laural Golden: two colon polyps (tubular adenomas), scattered diverticula at sigmoid, small hemorrhoids above and below dentate line, surveillance due in 2021.    COLONOSCOPY N/A 03/12/2020   Procedure: COLONOSCOPY;  Surgeon: Rogene Houston,  MD;  Location: [facility];  Service: Endoscopy;  Laterality: N/A;  8:00   EYE SURGERY     Left rotator cuff repair     POLYPECTOMY  03/12/2020   Procedure: POLYPECTOMY;  Surgeon: Rogene Houston, MD;  Location: [facility];  Service: Endoscopy;;   SHOULDER SURGERY Bilateral     Family History  Problem Relation Age of Onset   Leukemia Sister    Leukemia Brother    Heart attack Brother    Lung cancer Brother    Colon cancer Brother        less than age 74.    Social History   Tobacco Use   Smoking status: Former    Packs/day: 2.50    Years: 35.00    Additional pack years: 0.00    Total pack years: 87.50    Types: Cigarettes     Start date: 11/22/1958    Quit date: 11/21/1993    Years since quitting: 28.4   Smokeless tobacco: Never  Vaping Use   Vaping Use: Never used  Substance Use Topics   Alcohol use: Not Currently    Alcohol/week: 0.0 standard drinks of alcohol    Comment: occasional beer   Drug use: No    Allergies  Allergen Reactions   Codeine Nausea And Vomiting and Palpitations    Current Meds  Medication Sig   acetaminophen (TYLENOL) 500 MG tablet Take 1,000 mg by mouth every 6 (six) hours as needed for mild pain or moderate pain.   amLODipine (NORVASC) 5 MG tablet Take 5 mg by mouth daily in the afternoon.   atorvastatin (LIPITOR) 40 MG tablet TAKE ONE (1) TABLET BY MOUTH EVERY DAY   esomeprazole (NEXIUM) 40 MG capsule TAKE 30-60 MIN BEFORE FIRST MEAL OF THE DAY   famotidine (PEPCID) 20 MG tablet Take ONE (1) tablet  AFTER SUPPER   metoprolol succinate (TOPROL-XL) 25 MG 24 hr tablet Take 25 mg by mouth at bedtime.    warfarin (COUMADIN) 5 MG tablet TAKE ONE (1) TABLET DAILY EXCEPT ONE HALF (1/2) TABLET ON SUNDAYS, TUESDAYS AND THURSDAYS OR AS DIRECTED    Objective: BP (!) 154/87   Pulse 87   Ht 5\' 8"  (1.727 m)   Wt 203 lb (92.1 kg)   BMI 30.87 kg/m   Physical Exam:  General: Elderly male., Alert and oriented., and No acute distress. Gait: Normal gait.  Left shoulder without deformity.  Well-healed surgical incisions.  Near full range of motion.  He is restricted in internal rotation.  External rotation is similar to the contralateral side.  Mild weakness is appreciated.  Negative belly press.  Positive O'Brien's.  IMAGING: I personally ordered and reviewed the following images   X-rays of the left shoulder were obtained in clinic today.  No acute injuries are noted.  Well-maintained glenohumeral joint space.  Glenohumeral joint is reduced.  No osteophytes are appreciated.  No evidence of proximal humeral migration.  No bony lesions.  Impression: Negative left shoulder x-ray  New  Medications:  Meds ordered this encounter  Medications   methylPREDNISolone acetate (DEPO-MEDROL) injection 40 mg      Mordecai Rasmussen, MD  05/07/2022 8:00 AM

## 2022-05-25 ENCOUNTER — Ambulatory Visit
Admission: RE | Admit: 2022-05-25 | Discharge: 2022-05-25 | Disposition: A | Discharge status: Home / Self Care | Payer: Medicare PPO | Source: Ambulatory Visit | Attending: Surgery | Admitting: Surgery

## 2022-05-25 ENCOUNTER — Ambulatory Visit: Payer: Medicare PPO | Admitting: Surgery

## 2022-05-25 ENCOUNTER — Encounter: Payer: Self-pay | Admitting: Surgery

## 2022-05-25 VITALS — BP 152/87 | HR 64 | Resp 20 | Ht 68.0 in | Wt 201.0 lb

## 2022-05-25 DIAGNOSIS — J439 Emphysema, unspecified: Secondary | ICD-10-CM | POA: Diagnosis not present

## 2022-05-25 DIAGNOSIS — I7121 Aneurysm of the ascending aorta, without rupture: Secondary | ICD-10-CM

## 2022-05-25 DIAGNOSIS — I712 Thoracic aortic aneurysm, without rupture, unspecified: Secondary | ICD-10-CM | POA: Diagnosis not present

## 2022-05-25 DIAGNOSIS — J479 Bronchiectasis, uncomplicated: Secondary | ICD-10-CM | POA: Diagnosis not present

## 2022-05-25 DIAGNOSIS — I700 Atherosclerosis of aorta: Secondary | ICD-10-CM | POA: Diagnosis not present

## 2022-05-25 NOTE — Progress Notes (Signed)
HPI:  The patient is a 77 year old gentleman with a history of hypertension, bicuspid aortic valve disease, and ascending aortic aneurysm who underwent Bentall procedure using a 23 mm St. Jude mechanical valved graft by me in 2010.  The aorta was placed up to the origin of the innominate artery using hypothermic circulatory arrest.  He had an uneventful course and has done well over the years on Coumadin with his INR followed in the anticoagulation clinic.  He has had some enlargement of his aortic arch beyond the ascending graft and CTA of the chest in August 2021 showed it to be 4.7 cm in greatest diameter.  This was measured at 4.5 cm on CTA of the chest in April 2017.  CTA of the chest from 04/20/2020 showed a slight increase in the maximum diameter of the proximal aortic arch to 4.8 to 4.9 cm. The distal arch measured 3 cm and the descending aorta measured 2.6 to 2.9 cm.  He continues to feel well without any complaints.  Current Outpatient Medications  Medication Sig Dispense Refill   acetaminophen (TYLENOL) 500 MG tablet Take 1,000 mg by mouth every 6 (six) hours as needed for mild pain or moderate pain.     amLODipine (NORVASC) 5 MG tablet Take 5 mg by mouth daily in the afternoon.     atorvastatin (LIPITOR) 40 MG tablet TAKE ONE (1) TABLET BY MOUTH EVERY DAY 90 tablet 1   esomeprazole (NEXIUM) 40 MG capsule TAKE 30-60 MIN BEFORE FIRST MEAL OF THE DAY 30 capsule 11   famotidine (PEPCID) 20 MG tablet Take ONE (1) tablet  AFTER SUPPER 30 tablet 11   metoprolol succinate (TOPROL-XL) 25 MG 24 hr tablet Take 25 mg by mouth at bedtime.      warfarin (COUMADIN) 5 MG tablet TAKE ONE (1) TABLET DAILY EXCEPT ONE HALF (1/2) TABLET ON SUNDAYS, TUESDAYS AND THURSDAYS OR AS DIRECTED 90 tablet 3   oxyCODONE-acetaminophen (PERCOCET/ROXICET) 5-325 MG tablet Take 1 tablet by mouth every 6 (six) hours as needed for severe pain. (Patient not taking: Reported on 05/06/2022) 15 tablet 0   No current  facility-administered medications for this visit.     Physical Exam: BP (!) 152/87 (BP Location: Left Arm, Patient Position: Sitting)   Pulse 64   Resp 20   Ht 5\' 8"  (1.727 m)   Wt 201 lb (91.2 kg)   SpO2 95% Comment: rA  BMI 30.56 kg/m  He looks well. Cardiac exam shows a regular rate and rhythm with a crisp mechanical valve click.  There is no murmur. Lungs are clear.  Diagnostic Tests:  Narrative & Impression  CLINICAL DATA:  Follow-up thoracic aortic aneurysm status post aortic root repair   EXAM: CT CHEST WITHOUT CONTRAST   TECHNIQUE: Multidetector CT imaging of the chest was performed following the standard protocol without IV contrast.   RADIATION DOSE REDUCTION: This exam was performed according to the departmental dose-optimization program which includes automated exposure control, adjustment of the mA and/or kV according to patient size and/or use of iterative reconstruction technique.   COMPARISON:  05/19/2021   FINDINGS: Cardiovascular: Aortic atherosclerosis. Status post Bentall type aortic root graft repair. Maximum caliber of the native vessel in the proximal arch is unchanged at 4.7 x 4.4 cm (series 4, image 50, series 6, image 105). Descending thoracic aorta measures up to 3.3 x 3.3 cm in the midportion of the descending vessel, unchanged. Normal heart size. No pericardial effusion.   Mediastinum/Nodes: No enlarged mediastinal,  hilar, or axillary lymph nodes. Thyroid gland, trachea, and esophagus demonstrate no significant findings.   Lungs/Pleura: Moderate centrilobular and paraseptal emphysema. Unchanged mild pulmonary fibrosis in a pattern with apical to basal gradient, featuring irregular peripheral interstitial opacity, septal thickening, traction bronchiectasis and small areas of subpleural bronchiolectasis of the lung bases. Multiple thin walled pneumatoceles scattered throughout the lung bases. No pleural effusion or pneumothorax.    Upper Abdomen: No acute abnormality.   Musculoskeletal: No chest wall abnormality. No acute osseous findings.   IMPRESSION: 1. Status post Bentall type aortic root graft repair. Maximum caliber of the native vessel in the proximal arch is unchanged at 4.7 x 4.4 cm. Descending thoracic aorta measures up to 3.3 x 3.3 cm in the midportion of the descending vessel, unchanged. 2. Unchanged mild pulmonary fibrosis in a pattern with apical to basal gradient, featuring irregular peripheral interstitial opacity, septal thickening, traction bronchiectasis and small areas of subpleural bronchiolectasis of the lung bases. Findings are categorized as probable UIP per consensus guidelines: Diagnosis of Idiopathic Pulmonary Fibrosis: An Official ATS/ERS/JRS/ALAT Clinical Practice Guideline. [place], Iss 5, [phone], Oct 22 2016. 3. Emphysema.   Aortic Atherosclerosis (ICD10-I70.0) and Emphysema (ICD10-J43.9).     Electronically Signed   By: Delanna Ahmadi M.D.   On: 05/25/2022 09:33      Impression:  He has a stable aortic arch aneurysm with a maximum diameter of 4.7 cm in the proximal arch just beyond the previous repair.  This is still well below the surgical threshold of 5.5 cm.  I reviewed the CT images with him and answered all of his questions.  I stressed the importance of continued good blood pressure control in preventing further enlargement and acute aortic dissection.  I advised him against doing any heavy lifting or other physical activity that may require a Valsalva maneuver and could suddenly raise his blood pressure to high levels.  Plan:  I will see him back in 1 year with a CT scan of the chest without contrast for aortic aneurysm surveillance.  I spent 15 minutes performing this established patient evaluation and > 50% of this time was spent face to face counseling and coordinating the care of this patient's aortic aneurysm.    Gaye Pollack,  MD [facility] [phone]

## 2022-05-26 ENCOUNTER — Ambulatory Visit: Payer: Medicare PPO | Attending: Cardiology | Admitting: *Deleted

## 2022-05-26 DIAGNOSIS — Z952 Presence of prosthetic heart valve: Secondary | ICD-10-CM | POA: Diagnosis not present

## 2022-05-26 DIAGNOSIS — Z5181 Encounter for therapeutic drug level monitoring: Secondary | ICD-10-CM

## 2022-05-26 LAB — POCT INR: INR: 1.9 — AB (ref 2.0–3.0)

## 2022-05-26 NOTE — Patient Instructions (Signed)
Take warfarin 1 tablet tonight then resume 1 tablet daily except 1/2 tablet on Sundays, Tuesdays and Thursdays Eat extra greens today Recheck in 4 weeks

## 2022-06-10 DIAGNOSIS — Z961 Presence of intraocular lens: Secondary | ICD-10-CM | POA: Diagnosis not present

## 2022-06-23 ENCOUNTER — Ambulatory Visit: Payer: Medicare PPO | Attending: Cardiology | Admitting: *Deleted

## 2022-06-23 DIAGNOSIS — Z5181 Encounter for therapeutic drug level monitoring: Secondary | ICD-10-CM | POA: Diagnosis not present

## 2022-06-23 DIAGNOSIS — Z952 Presence of prosthetic heart valve: Secondary | ICD-10-CM | POA: Diagnosis not present

## 2022-06-23 LAB — POCT INR: INR: 2.7 (ref 2.0–3.0)

## 2022-06-23 NOTE — Patient Instructions (Signed)
Continue warfarin 1 tablet daily except 1/2 tablet on Sundays, Tuesdays and Thursdays Continue greens Recheck in 4 weeks  

## 2022-07-01 DIAGNOSIS — M9902 Segmental and somatic dysfunction of thoracic region: Secondary | ICD-10-CM | POA: Diagnosis not present

## 2022-07-01 DIAGNOSIS — M9905 Segmental and somatic dysfunction of pelvic region: Secondary | ICD-10-CM | POA: Diagnosis not present

## 2022-07-01 DIAGNOSIS — M5441 Lumbago with sciatica, right side: Secondary | ICD-10-CM | POA: Diagnosis not present

## 2022-07-01 DIAGNOSIS — M9906 Segmental and somatic dysfunction of lower extremity: Secondary | ICD-10-CM | POA: Diagnosis not present

## 2022-07-01 DIAGNOSIS — M9903 Segmental and somatic dysfunction of lumbar region: Secondary | ICD-10-CM | POA: Diagnosis not present

## 2022-07-01 DIAGNOSIS — M5442 Lumbago with sciatica, left side: Secondary | ICD-10-CM | POA: Diagnosis not present

## 2022-07-04 DIAGNOSIS — M9902 Segmental and somatic dysfunction of thoracic region: Secondary | ICD-10-CM | POA: Diagnosis not present

## 2022-07-04 DIAGNOSIS — M9905 Segmental and somatic dysfunction of pelvic region: Secondary | ICD-10-CM | POA: Diagnosis not present

## 2022-07-04 DIAGNOSIS — M9906 Segmental and somatic dysfunction of lower extremity: Secondary | ICD-10-CM | POA: Diagnosis not present

## 2022-07-04 DIAGNOSIS — M5442 Lumbago with sciatica, left side: Secondary | ICD-10-CM | POA: Diagnosis not present

## 2022-07-04 DIAGNOSIS — M5441 Lumbago with sciatica, right side: Secondary | ICD-10-CM | POA: Diagnosis not present

## 2022-07-04 DIAGNOSIS — M9903 Segmental and somatic dysfunction of lumbar region: Secondary | ICD-10-CM | POA: Diagnosis not present

## 2022-07-05 DIAGNOSIS — M9903 Segmental and somatic dysfunction of lumbar region: Secondary | ICD-10-CM | POA: Diagnosis not present

## 2022-07-05 DIAGNOSIS — M9902 Segmental and somatic dysfunction of thoracic region: Secondary | ICD-10-CM | POA: Diagnosis not present

## 2022-07-05 DIAGNOSIS — M5441 Lumbago with sciatica, right side: Secondary | ICD-10-CM | POA: Diagnosis not present

## 2022-07-05 DIAGNOSIS — M5442 Lumbago with sciatica, left side: Secondary | ICD-10-CM | POA: Diagnosis not present

## 2022-07-05 DIAGNOSIS — M9905 Segmental and somatic dysfunction of pelvic region: Secondary | ICD-10-CM | POA: Diagnosis not present

## 2022-07-05 DIAGNOSIS — M9906 Segmental and somatic dysfunction of lower extremity: Secondary | ICD-10-CM | POA: Diagnosis not present

## 2022-07-08 DIAGNOSIS — M9905 Segmental and somatic dysfunction of pelvic region: Secondary | ICD-10-CM | POA: Diagnosis not present

## 2022-07-08 DIAGNOSIS — M9902 Segmental and somatic dysfunction of thoracic region: Secondary | ICD-10-CM | POA: Diagnosis not present

## 2022-07-08 DIAGNOSIS — M9906 Segmental and somatic dysfunction of lower extremity: Secondary | ICD-10-CM | POA: Diagnosis not present

## 2022-07-08 DIAGNOSIS — M5441 Lumbago with sciatica, right side: Secondary | ICD-10-CM | POA: Diagnosis not present

## 2022-07-08 DIAGNOSIS — M5442 Lumbago with sciatica, left side: Secondary | ICD-10-CM | POA: Diagnosis not present

## 2022-07-08 DIAGNOSIS — M9903 Segmental and somatic dysfunction of lumbar region: Secondary | ICD-10-CM | POA: Diagnosis not present

## 2022-07-11 DIAGNOSIS — M9903 Segmental and somatic dysfunction of lumbar region: Secondary | ICD-10-CM | POA: Diagnosis not present

## 2022-07-11 DIAGNOSIS — M5441 Lumbago with sciatica, right side: Secondary | ICD-10-CM | POA: Diagnosis not present

## 2022-07-11 DIAGNOSIS — M9902 Segmental and somatic dysfunction of thoracic region: Secondary | ICD-10-CM | POA: Diagnosis not present

## 2022-07-11 DIAGNOSIS — M9906 Segmental and somatic dysfunction of lower extremity: Secondary | ICD-10-CM | POA: Diagnosis not present

## 2022-07-11 DIAGNOSIS — M9905 Segmental and somatic dysfunction of pelvic region: Secondary | ICD-10-CM | POA: Diagnosis not present

## 2022-07-11 DIAGNOSIS — M5442 Lumbago with sciatica, left side: Secondary | ICD-10-CM | POA: Diagnosis not present

## 2022-07-14 DIAGNOSIS — M9902 Segmental and somatic dysfunction of thoracic region: Secondary | ICD-10-CM | POA: Diagnosis not present

## 2022-07-14 DIAGNOSIS — M9903 Segmental and somatic dysfunction of lumbar region: Secondary | ICD-10-CM | POA: Diagnosis not present

## 2022-07-14 DIAGNOSIS — M5441 Lumbago with sciatica, right side: Secondary | ICD-10-CM | POA: Diagnosis not present

## 2022-07-14 DIAGNOSIS — M9906 Segmental and somatic dysfunction of lower extremity: Secondary | ICD-10-CM | POA: Diagnosis not present

## 2022-07-14 DIAGNOSIS — M5442 Lumbago with sciatica, left side: Secondary | ICD-10-CM | POA: Diagnosis not present

## 2022-07-14 DIAGNOSIS — M9905 Segmental and somatic dysfunction of pelvic region: Secondary | ICD-10-CM | POA: Diagnosis not present

## 2022-07-19 DIAGNOSIS — M5441 Lumbago with sciatica, right side: Secondary | ICD-10-CM | POA: Diagnosis not present

## 2022-07-19 DIAGNOSIS — M9906 Segmental and somatic dysfunction of lower extremity: Secondary | ICD-10-CM | POA: Diagnosis not present

## 2022-07-19 DIAGNOSIS — M9905 Segmental and somatic dysfunction of pelvic region: Secondary | ICD-10-CM | POA: Diagnosis not present

## 2022-07-19 DIAGNOSIS — M5442 Lumbago with sciatica, left side: Secondary | ICD-10-CM | POA: Diagnosis not present

## 2022-07-19 DIAGNOSIS — M9902 Segmental and somatic dysfunction of thoracic region: Secondary | ICD-10-CM | POA: Diagnosis not present

## 2022-07-19 DIAGNOSIS — M9903 Segmental and somatic dysfunction of lumbar region: Secondary | ICD-10-CM | POA: Diagnosis not present

## 2022-07-21 ENCOUNTER — Ambulatory Visit: Payer: Medicare PPO | Attending: Cardiology | Admitting: *Deleted

## 2022-07-21 DIAGNOSIS — Z952 Presence of prosthetic heart valve: Secondary | ICD-10-CM

## 2022-07-21 DIAGNOSIS — Z5181 Encounter for therapeutic drug level monitoring: Secondary | ICD-10-CM | POA: Diagnosis not present

## 2022-07-21 LAB — POCT INR: POC INR: 3.5

## 2022-07-21 NOTE — Patient Instructions (Signed)
Description   Hold warfarin today and then continue warfarin 1 tablet daily except 1/2 tablet on Sundays, Tuesdays and Thursdays Recheck in 3 weeks

## 2022-07-25 DIAGNOSIS — M5441 Lumbago with sciatica, right side: Secondary | ICD-10-CM | POA: Diagnosis not present

## 2022-07-25 DIAGNOSIS — M9906 Segmental and somatic dysfunction of lower extremity: Secondary | ICD-10-CM | POA: Diagnosis not present

## 2022-07-25 DIAGNOSIS — M9905 Segmental and somatic dysfunction of pelvic region: Secondary | ICD-10-CM | POA: Diagnosis not present

## 2022-07-25 DIAGNOSIS — M9902 Segmental and somatic dysfunction of thoracic region: Secondary | ICD-10-CM | POA: Diagnosis not present

## 2022-07-25 DIAGNOSIS — M9903 Segmental and somatic dysfunction of lumbar region: Secondary | ICD-10-CM | POA: Diagnosis not present

## 2022-07-25 DIAGNOSIS — M5442 Lumbago with sciatica, left side: Secondary | ICD-10-CM | POA: Diagnosis not present

## 2022-08-02 DIAGNOSIS — M5441 Lumbago with sciatica, right side: Secondary | ICD-10-CM | POA: Diagnosis not present

## 2022-08-02 DIAGNOSIS — M5442 Lumbago with sciatica, left side: Secondary | ICD-10-CM | POA: Diagnosis not present

## 2022-08-02 DIAGNOSIS — M9902 Segmental and somatic dysfunction of thoracic region: Secondary | ICD-10-CM | POA: Diagnosis not present

## 2022-08-02 DIAGNOSIS — M9903 Segmental and somatic dysfunction of lumbar region: Secondary | ICD-10-CM | POA: Diagnosis not present

## 2022-08-02 DIAGNOSIS — M9906 Segmental and somatic dysfunction of lower extremity: Secondary | ICD-10-CM | POA: Diagnosis not present

## 2022-08-02 DIAGNOSIS — M9905 Segmental and somatic dysfunction of pelvic region: Secondary | ICD-10-CM | POA: Diagnosis not present

## 2022-08-11 ENCOUNTER — Ambulatory Visit: Payer: Medicare PPO | Attending: Cardiology | Admitting: *Deleted

## 2022-08-11 DIAGNOSIS — Z5181 Encounter for therapeutic drug level monitoring: Secondary | ICD-10-CM | POA: Diagnosis not present

## 2022-08-11 DIAGNOSIS — Z952 Presence of prosthetic heart valve: Secondary | ICD-10-CM | POA: Diagnosis not present

## 2022-08-11 LAB — POCT INR: INR: 2.9 (ref 2.0–3.0)

## 2022-08-11 NOTE — Patient Instructions (Signed)
Continue warfarin 1 tablet daily except 1/2 tablet on Sundays, Tuesdays and Thursdays.  Recheck in 4 weeks 

## 2022-08-15 DIAGNOSIS — M9906 Segmental and somatic dysfunction of lower extremity: Secondary | ICD-10-CM | POA: Diagnosis not present

## 2022-08-15 DIAGNOSIS — M9905 Segmental and somatic dysfunction of pelvic region: Secondary | ICD-10-CM | POA: Diagnosis not present

## 2022-08-15 DIAGNOSIS — M5441 Lumbago with sciatica, right side: Secondary | ICD-10-CM | POA: Diagnosis not present

## 2022-08-15 DIAGNOSIS — M5442 Lumbago with sciatica, left side: Secondary | ICD-10-CM | POA: Diagnosis not present

## 2022-08-15 DIAGNOSIS — M9903 Segmental and somatic dysfunction of lumbar region: Secondary | ICD-10-CM | POA: Diagnosis not present

## 2022-08-15 DIAGNOSIS — M9902 Segmental and somatic dysfunction of thoracic region: Secondary | ICD-10-CM | POA: Diagnosis not present

## 2022-08-16 ENCOUNTER — Ambulatory Visit (HOSPITAL_COMMUNITY)
Admission: RE | Admit: 2022-08-16 | Discharge: 2022-08-16 | Disposition: A | Discharge status: Home / Self Care | Payer: Medicare PPO | Source: Ambulatory Visit | Attending: Internal Medicine | Admitting: Internal Medicine

## 2022-08-16 DIAGNOSIS — J84112 Idiopathic pulmonary fibrosis: Secondary | ICD-10-CM | POA: Diagnosis not present

## 2022-08-16 LAB — PULMONARY FUNCTION TEST
DL/VA % pred: 78 %
DL/VA: 3.11 ml/min/mmHg/L
DLCO unc % pred: 53 %
DLCO unc: 12.46 ml/min/mmHg
FEF 25-75 Post: 2.28 L/sec
FEF 25-75 Pre: 1.44 L/sec
FEF2575-%Change-Post: 58 %
FEF2575-%Pred-Post: 111 %
FEF2575-%Pred-Pre: 70 %
FEV1-%Change-Post: 9 %
FEV1-%Pred-Post: 80 %
FEV1-%Pred-Pre: 73 %
FEV1-Post: 2.27 L
FEV1-Pre: 2.08 L
FEV1FVC-%Change-Post: 3 %
FEV1FVC-%Pred-Pre: 103 %
FEV6-%Change-Post: 6 %
FEV6-%Pred-Post: 78 %
FEV6-%Pred-Pre: 73 %
FEV6-Post: 2.86 L
FEV6-Pre: 2.68 L
FEV6FVC-%Change-Post: 1 %
FEV6FVC-%Pred-Post: 105 %
FEV6FVC-%Pred-Pre: 103 %
FVC-%Change-Post: 5 %
FVC-%Pred-Post: 74 %
FVC-%Pred-Pre: 70 %
FVC-Post: 2.92 L
FVC-Pre: 2.76 L
Post FEV1/FVC ratio: 78 %
Post FEV6/FVC ratio: 98 %
Pre FEV1/FVC ratio: 75 %
Pre FEV6/FVC Ratio: 97 %
RV % pred: 85 %
RV: 2.1 L
TLC % pred: 73 %
TLC: 4.88 L

## 2022-08-16 MED ORDER — ALBUTEROL SULFATE (2.5 MG/3ML) 0.083% IN NEBU
2.5000 mg | INHALATION_SOLUTION | Freq: Once | RESPIRATORY_TRACT | Status: AC
  Administered 2022-08-16: 2.5 mg via RESPIRATORY_TRACT

## 2022-08-23 ENCOUNTER — Encounter (HOSPITAL_COMMUNITY): Payer: Medicare PPO

## 2022-08-23 DIAGNOSIS — J8410 Pulmonary fibrosis, unspecified: Secondary | ICD-10-CM | POA: Diagnosis not present

## 2022-08-23 DIAGNOSIS — I10 Essential (primary) hypertension: Secondary | ICD-10-CM | POA: Diagnosis not present

## 2022-08-23 DIAGNOSIS — N1832 Chronic kidney disease, stage 3b: Secondary | ICD-10-CM | POA: Diagnosis not present

## 2022-08-23 DIAGNOSIS — Z79899 Other long term (current) drug therapy: Secondary | ICD-10-CM | POA: Diagnosis not present

## 2022-08-29 DIAGNOSIS — M9903 Segmental and somatic dysfunction of lumbar region: Secondary | ICD-10-CM | POA: Diagnosis not present

## 2022-08-29 DIAGNOSIS — M9906 Segmental and somatic dysfunction of lower extremity: Secondary | ICD-10-CM | POA: Diagnosis not present

## 2022-08-29 DIAGNOSIS — M9905 Segmental and somatic dysfunction of pelvic region: Secondary | ICD-10-CM | POA: Diagnosis not present

## 2022-08-29 DIAGNOSIS — M9902 Segmental and somatic dysfunction of thoracic region: Secondary | ICD-10-CM | POA: Diagnosis not present

## 2022-08-29 DIAGNOSIS — M5442 Lumbago with sciatica, left side: Secondary | ICD-10-CM | POA: Diagnosis not present

## 2022-08-29 DIAGNOSIS — M5441 Lumbago with sciatica, right side: Secondary | ICD-10-CM | POA: Diagnosis not present

## 2022-08-30 DIAGNOSIS — N1831 Chronic kidney disease, stage 3a: Secondary | ICD-10-CM | POA: Diagnosis not present

## 2022-08-30 DIAGNOSIS — I10 Essential (primary) hypertension: Secondary | ICD-10-CM | POA: Diagnosis not present

## 2022-09-06 DIAGNOSIS — M9902 Segmental and somatic dysfunction of thoracic region: Secondary | ICD-10-CM | POA: Diagnosis not present

## 2022-09-06 DIAGNOSIS — M9903 Segmental and somatic dysfunction of lumbar region: Secondary | ICD-10-CM | POA: Diagnosis not present

## 2022-09-06 DIAGNOSIS — M5442 Lumbago with sciatica, left side: Secondary | ICD-10-CM | POA: Diagnosis not present

## 2022-09-06 DIAGNOSIS — M5441 Lumbago with sciatica, right side: Secondary | ICD-10-CM | POA: Diagnosis not present

## 2022-09-06 DIAGNOSIS — M9905 Segmental and somatic dysfunction of pelvic region: Secondary | ICD-10-CM | POA: Diagnosis not present

## 2022-09-06 DIAGNOSIS — M9906 Segmental and somatic dysfunction of lower extremity: Secondary | ICD-10-CM | POA: Diagnosis not present

## 2022-09-08 ENCOUNTER — Ambulatory Visit: Payer: Medicare PPO | Attending: Cardiology | Admitting: *Deleted

## 2022-09-08 DIAGNOSIS — Z5181 Encounter for therapeutic drug level monitoring: Secondary | ICD-10-CM

## 2022-09-08 DIAGNOSIS — Z952 Presence of prosthetic heart valve: Secondary | ICD-10-CM

## 2022-09-08 LAB — POCT INR: INR: 2.7 (ref 2.0–3.0)

## 2022-09-08 NOTE — Patient Instructions (Signed)
Continue warfarin 1 tablet daily except 1/2 tablet on Sundays, Tuesdays and Thursdays.  Recheck in 6 weeks 

## 2022-09-13 DIAGNOSIS — M9903 Segmental and somatic dysfunction of lumbar region: Secondary | ICD-10-CM | POA: Diagnosis not present

## 2022-09-13 DIAGNOSIS — M9905 Segmental and somatic dysfunction of pelvic region: Secondary | ICD-10-CM | POA: Diagnosis not present

## 2022-09-13 DIAGNOSIS — M9906 Segmental and somatic dysfunction of lower extremity: Secondary | ICD-10-CM | POA: Diagnosis not present

## 2022-09-13 DIAGNOSIS — M5441 Lumbago with sciatica, right side: Secondary | ICD-10-CM | POA: Diagnosis not present

## 2022-09-13 DIAGNOSIS — M9902 Segmental and somatic dysfunction of thoracic region: Secondary | ICD-10-CM | POA: Diagnosis not present

## 2022-09-13 DIAGNOSIS — M5442 Lumbago with sciatica, left side: Secondary | ICD-10-CM | POA: Diagnosis not present

## 2022-09-27 DIAGNOSIS — M5442 Lumbago with sciatica, left side: Secondary | ICD-10-CM | POA: Diagnosis not present

## 2022-09-27 DIAGNOSIS — M9906 Segmental and somatic dysfunction of lower extremity: Secondary | ICD-10-CM | POA: Diagnosis not present

## 2022-09-27 DIAGNOSIS — M9905 Segmental and somatic dysfunction of pelvic region: Secondary | ICD-10-CM | POA: Diagnosis not present

## 2022-09-27 DIAGNOSIS — M9902 Segmental and somatic dysfunction of thoracic region: Secondary | ICD-10-CM | POA: Diagnosis not present

## 2022-09-27 DIAGNOSIS — M9903 Segmental and somatic dysfunction of lumbar region: Secondary | ICD-10-CM | POA: Diagnosis not present

## 2022-09-27 DIAGNOSIS — M5441 Lumbago with sciatica, right side: Secondary | ICD-10-CM | POA: Diagnosis not present

## 2022-10-14 DIAGNOSIS — M5442 Lumbago with sciatica, left side: Secondary | ICD-10-CM | POA: Diagnosis not present

## 2022-10-14 DIAGNOSIS — M9905 Segmental and somatic dysfunction of pelvic region: Secondary | ICD-10-CM | POA: Diagnosis not present

## 2022-10-14 DIAGNOSIS — M9903 Segmental and somatic dysfunction of lumbar region: Secondary | ICD-10-CM | POA: Diagnosis not present

## 2022-10-14 DIAGNOSIS — M5441 Lumbago with sciatica, right side: Secondary | ICD-10-CM | POA: Diagnosis not present

## 2022-10-14 DIAGNOSIS — M9906 Segmental and somatic dysfunction of lower extremity: Secondary | ICD-10-CM | POA: Diagnosis not present

## 2022-10-14 DIAGNOSIS — M9902 Segmental and somatic dysfunction of thoracic region: Secondary | ICD-10-CM | POA: Diagnosis not present

## 2022-10-20 ENCOUNTER — Ambulatory Visit: Payer: Medicare PPO | Attending: Cardiology | Admitting: *Deleted

## 2022-10-20 DIAGNOSIS — Z5181 Encounter for therapeutic drug level monitoring: Secondary | ICD-10-CM

## 2022-10-20 DIAGNOSIS — Z952 Presence of prosthetic heart valve: Secondary | ICD-10-CM

## 2022-10-20 LAB — POCT INR: POC INR: 2.9

## 2022-10-20 NOTE — Patient Instructions (Signed)
Description   Continue warfarin 1 tablet daily except 1/2 tablet on Sundays, Tuesdays and Thursdays Recheck in 6 weeks       

## 2022-10-25 DIAGNOSIS — M9902 Segmental and somatic dysfunction of thoracic region: Secondary | ICD-10-CM | POA: Diagnosis not present

## 2022-10-25 DIAGNOSIS — M9903 Segmental and somatic dysfunction of lumbar region: Secondary | ICD-10-CM | POA: Diagnosis not present

## 2022-10-25 DIAGNOSIS — M9905 Segmental and somatic dysfunction of pelvic region: Secondary | ICD-10-CM | POA: Diagnosis not present

## 2022-10-25 DIAGNOSIS — M5442 Lumbago with sciatica, left side: Secondary | ICD-10-CM | POA: Diagnosis not present

## 2022-10-25 DIAGNOSIS — M9906 Segmental and somatic dysfunction of lower extremity: Secondary | ICD-10-CM | POA: Diagnosis not present

## 2022-10-25 DIAGNOSIS — M5441 Lumbago with sciatica, right side: Secondary | ICD-10-CM | POA: Diagnosis not present

## 2022-11-01 DIAGNOSIS — L570 Actinic keratosis: Secondary | ICD-10-CM | POA: Diagnosis not present

## 2022-11-08 DIAGNOSIS — M9903 Segmental and somatic dysfunction of lumbar region: Secondary | ICD-10-CM | POA: Diagnosis not present

## 2022-11-08 DIAGNOSIS — M5442 Lumbago with sciatica, left side: Secondary | ICD-10-CM | POA: Diagnosis not present

## 2022-11-08 DIAGNOSIS — M5441 Lumbago with sciatica, right side: Secondary | ICD-10-CM | POA: Diagnosis not present

## 2022-11-08 DIAGNOSIS — M9905 Segmental and somatic dysfunction of pelvic region: Secondary | ICD-10-CM | POA: Diagnosis not present

## 2022-11-08 DIAGNOSIS — M9906 Segmental and somatic dysfunction of lower extremity: Secondary | ICD-10-CM | POA: Diagnosis not present

## 2022-11-08 DIAGNOSIS — M9902 Segmental and somatic dysfunction of thoracic region: Secondary | ICD-10-CM | POA: Diagnosis not present

## 2022-11-13 DIAGNOSIS — M1990 Unspecified osteoarthritis, unspecified site: Secondary | ICD-10-CM | POA: Diagnosis not present

## 2022-11-13 DIAGNOSIS — N1832 Chronic kidney disease, stage 3b: Secondary | ICD-10-CM | POA: Diagnosis not present

## 2022-11-13 DIAGNOSIS — N529 Male erectile dysfunction, unspecified: Secondary | ICD-10-CM | POA: Diagnosis not present

## 2022-11-13 DIAGNOSIS — E785 Hyperlipidemia, unspecified: Secondary | ICD-10-CM | POA: Diagnosis not present

## 2022-11-13 DIAGNOSIS — I129 Hypertensive chronic kidney disease with stage 1 through stage 4 chronic kidney disease, or unspecified chronic kidney disease: Secondary | ICD-10-CM | POA: Diagnosis not present

## 2022-11-13 DIAGNOSIS — I4891 Unspecified atrial fibrillation: Secondary | ICD-10-CM | POA: Diagnosis not present

## 2022-11-13 DIAGNOSIS — E669 Obesity, unspecified: Secondary | ICD-10-CM | POA: Diagnosis not present

## 2022-11-13 DIAGNOSIS — I2510 Atherosclerotic heart disease of native coronary artery without angina pectoris: Secondary | ICD-10-CM | POA: Diagnosis not present

## 2022-11-13 DIAGNOSIS — K219 Gastro-esophageal reflux disease without esophagitis: Secondary | ICD-10-CM | POA: Diagnosis not present

## 2022-11-13 DIAGNOSIS — I739 Peripheral vascular disease, unspecified: Secondary | ICD-10-CM | POA: Diagnosis not present

## 2022-11-21 DIAGNOSIS — M545 Low back pain, unspecified: Secondary | ICD-10-CM | POA: Diagnosis not present

## 2022-11-21 DIAGNOSIS — M25551 Pain in right hip: Secondary | ICD-10-CM | POA: Diagnosis not present

## 2022-11-21 DIAGNOSIS — G8929 Other chronic pain: Secondary | ICD-10-CM | POA: Diagnosis not present

## 2022-11-21 DIAGNOSIS — M25552 Pain in left hip: Secondary | ICD-10-CM | POA: Diagnosis not present

## 2022-11-22 DIAGNOSIS — M9903 Segmental and somatic dysfunction of lumbar region: Secondary | ICD-10-CM | POA: Diagnosis not present

## 2022-11-22 DIAGNOSIS — M5442 Lumbago with sciatica, left side: Secondary | ICD-10-CM | POA: Diagnosis not present

## 2022-11-22 DIAGNOSIS — M9905 Segmental and somatic dysfunction of pelvic region: Secondary | ICD-10-CM | POA: Diagnosis not present

## 2022-11-22 DIAGNOSIS — M9906 Segmental and somatic dysfunction of lower extremity: Secondary | ICD-10-CM | POA: Diagnosis not present

## 2022-11-22 DIAGNOSIS — M5441 Lumbago with sciatica, right side: Secondary | ICD-10-CM | POA: Diagnosis not present

## 2022-11-22 DIAGNOSIS — M9902 Segmental and somatic dysfunction of thoracic region: Secondary | ICD-10-CM | POA: Diagnosis not present

## 2022-12-01 ENCOUNTER — Ambulatory Visit: Payer: Medicare PPO | Attending: Cardiology | Admitting: *Deleted

## 2022-12-01 DIAGNOSIS — Z952 Presence of prosthetic heart valve: Secondary | ICD-10-CM | POA: Diagnosis not present

## 2022-12-01 DIAGNOSIS — Z5181 Encounter for therapeutic drug level monitoring: Secondary | ICD-10-CM | POA: Diagnosis not present

## 2022-12-01 LAB — POCT INR: INR: 2.2 (ref 2.0–3.0)

## 2022-12-01 NOTE — Patient Instructions (Signed)
Continue warfarin 1 tablet daily except 1/2 tablet on Sundays, Tuesdays and Thursdays.  Recheck in 6 weeks 

## 2022-12-04 NOTE — Progress Notes (Unsigned)
Stephen Moses, male    DOB: 04-Feb-1946,    MRN: [id number]   Brief patient profile:  89  yowm  quit smoking 1995 referred to pulmonary clinic in [place]  06/04/2021 by Stephen Moses  for PF  s/p amiodarone exp 2012 ? 6 m    05/12/19  Stephen Moses eval for PF min progression since 05/2015 but Probable Mild  UIP   History of Present Illness  06/04/2021  Pulmonary/ 1st office eval/ Stephen Moses / [name]  Chief Complaint  Patient presents with   New Patient (Initial Visit)    SOB was seeing Dr. Celine Moses   Dyspnea:  mostly with carrying heavy objects / able to walk to end of drive way and back up hill to house and feels it's a bit more difficult than prior years but can still do s stopping Cough: slt hoarse min cough/ attributes to overt HB no GI rx  Sleep: flat one pillow / overt gerd symptoms worse  flat SABA use: no hfa  Rec Nexium 40 mg   Take  30-60 min before first meal of the day and Pepcid (famotidine)  20 mg one hour before bedtime until return to office  GERD diet Make sure you check your oxygen saturation  AT  your highest level of activity (not after you stop)   to be sure it stays over 90%   Please remember to go to the lab department   > pos RA / ana > referred to Rheumatology   >  DJD    Please schedule a follow up visit in 3 months but call sooner if needed with PFTs        03/11/2022  f/u ov/[name] office/Stephen Moses re: PF s/p amiodarone exp 2012 ? x 6 m  maint on  gerd rx  Chief Complaint  Patient presents with   Follow-up    Pt f/u states his breathing is the same, no concerns   Dyspnea:  more limited by back than breathing/ able to do yard work  Cough: minimal / assoc hoarse  Sleeping: bed flat bed one pillow  SABA use: inahlers  02: none  Rec Make sure you check your oxygen saturation at your highest level of activity(NOT after you stop)  to be sure it stays over 90% and keep track of it at least once a week, more often if breathing getting worse, and let me know if losing  ground. (Collect the dots to connect the dots approach)   Please schedule a follow up visit in 9 months but call sooner if needed  Add pfts due 07/2021     12/05/2022  f/u ov/[name] office/Stephen Moses re: PF s/p amiodarone exp 2012 ? x 6 m  maint on  gerd rx Chief Complaint  Patient presents with   IPF (idiopathic pulmonary fibrosis)   Dyspnea:  weed eating / not checking sats as rec / mb slt downhill x 110 ft and does not have to stop unless back stops  Cough: minimal  Sleeping: bed is flat/ one pillow s  resp cc  SABA use: none  02: none  No longer able to use pepcid p supper convinced it causes constipation   No obvious day to day or daytime variability or assoc excess/ purulent sputum or mucus plugs or hemoptysis or cp or chest tightness, subjective wheeze or overt sinus or hb symptoms.    Also denies any obvious fluctuation of symptoms with weather or environmental changes or other aggravating or alleviating factors except as  outlined above   No unusual exposure hx or h/o childhood pna/ asthma or knowledge of premature birth.  Current Allergies, Complete Past Medical History, Past Surgical History, Family History, and Social History were reviewed in [employer] record.  ROS  The following are not active complaints unless bolded Hoarseness, sore throat, dysphagia, dental problems, itching, sneezing,  nasal congestion or discharge of excess mucus or purulent secretions, ear ache,   fever, chills, sweats, unintended wt loss or wt gain, classically pleuritic or exertional cp,  orthopnea pnd or arm/hand swelling  or leg swelling, presyncope, palpitations, abdominal pain, anorexia, nausea, vomiting, diarrhea  or change in bowel habits or change in bladder habits, change in stools or change in urine, dysuria, hematuria,  rash, arthralgias/back pain, visual complaints, headache, numbness, weakness or ataxia or problems with walking or coordination,  change in mood or   memory.        Current Meds  Medication Sig   acetaminophen (TYLENOL) 500 MG tablet Take 1,000 mg by mouth every 6 (six) hours as needed for mild pain or moderate pain.   amLODipine (NORVASC) 5 MG tablet Take 5 mg by mouth daily in the afternoon.   atorvastatin (LIPITOR) 40 MG tablet TAKE ONE (1) TABLET BY MOUTH EVERY DAY   esomeprazole (NEXIUM) 40 MG capsule TAKE 30-60 MIN BEFORE FIRST MEAL OF THE DAY   metoprolol succinate (TOPROL-XL) 25 MG 24 hr tablet Take 25 mg by mouth at bedtime.    warfarin (COUMADIN) 5 MG tablet TAKE ONE (1) TABLET DAILY EXCEPT ONE HALF (1/2) TABLET ON SUNDAYS, TUESDAYS AND THURSDAYS OR AS DIRECTED   [DISCONTINUED] famotidine (PEPCID) 20 MG tablet Take ONE (1) tablet  AFTER SUPPER                     Past Medical History:  Diagnosis Date   Ascending aortic aneurysm (HCC) 2010    23-mm St. Jude mechanical valve conduit and re-implantation of the coronary arteries   Bicuspid aortic valve    Calculus of kidney    Essential hypertension    Long term current use of anticoagulant    Normocytic anemia    Pancytopenia (HCC)    Paraspinal muscle spasm        Objective:    Wts  12/05/2022     204    03/11/2022       20 3   09/06/21 202 lb 12.8 oz (92 kg)  07/30/21 202 lb (91.6 kg)  06/04/21 204 lb 6.4 oz (92.7 kg)     Vital signs reviewed  12/05/2022  - Note at rest 02 sats  96% on RA   General appearance:  Amb  slt hoarse pleasant wm nad           HEENT : Oropharynx  clear/ edentulous          NECK :  without  apparent JVD/ palpable Nodes/TM    LUNGS: no acc muscle use,  Nl contour chest with insp crackles both bases and sometimes cough on deep inspiration    CV:  RRR  no s3  mechanical S2 s significant murmur or increase in P2, and no edema   ABD:  soft and nontender with nl inspiratory excursion in the supine position. No bruits or organomegaly appreciated   MS:  Nl gait/ ext warm without deformities Or obvious joint restrictions  calf  tenderness, cyanosis   - NO clubbing    SKIN: warm and dry without lesions  NEURO:  alert, approp, nl sensorium with  no motor or cerebellar deficits apparent.     I personally reviewed images and agree with radiology impression as follows:   Chest CT   s contrast  05/25/22  Unchanged mild pulmonary fibrosis in a pattern with apical to basal gradient, featuring irregular peripheral interstitial opacity, septal thickening, traction bronchiectasis and small areas of subpleural bronchiolectasis of the lung bases. Findings are categorized as probable UIP per consensus guidelines     Assessment

## 2022-12-05 ENCOUNTER — Encounter: Payer: Self-pay | Admitting: Internal Medicine

## 2022-12-05 ENCOUNTER — Ambulatory Visit: Payer: Medicare PPO | Admitting: Internal Medicine

## 2022-12-05 VITALS — BP 147/73 | HR 78 | Ht 68.0 in | Wt 204.0 lb

## 2022-12-05 DIAGNOSIS — J84112 Idiopathic pulmonary fibrosis: Secondary | ICD-10-CM | POA: Diagnosis not present

## 2022-12-05 DIAGNOSIS — Z23 Encounter for immunization: Secondary | ICD-10-CM | POA: Diagnosis not present

## 2022-12-05 MED ORDER — ESOMEPRAZOLE MAGNESIUM 40 MG PO CPDR: 40.0000 mg | DELAYED_RELEASE_CAPSULE | Freq: Two times a day (BID) | ORAL | 1 refills | Status: DC

## 2022-12-05 NOTE — Patient Instructions (Addendum)
Make sure you check your oxygen saturation at your highest level of activity(NOT after you stop)  to be sure it stays over 90% and keep track of it at least once a week, more often if breathing getting worse, and let me know if losing ground. (Collect the dots to connect the dots approach)    Ok to take extra dose of Nexium before supper if problems with heartburn or increase cough in pm hours   Pulmonary follow up is as needed - stay as active as you can

## 2022-12-05 NOTE — Assessment & Plan Note (Signed)
Onset of symptoms around 2016/17  s/p amiodarone exp  2012 x ? 6 m?  - HRCT 03/25/19  prob mild UIP   - CT chest s contrast 05/19/21 Mild basilar predominant fibrotic changes, no evidence progression when compared to prior exam. Compatible with combined pulmonary fibrosis and emphysema. - 06/04/2021   Walked on RA  x  3  lap(s) =  approx 450  ft  @ mod/cane pace, stopped due to end of study with lowest 02 sats 92% s sob  - 06/04/2021   HSP serology  Neg - 06/04/21  Collagen vasc profile  ESR 10 but RA and ANA pos> rheum eval completed Dr Dimple Casey  07/30/21 neg x for DJD - PFT's  07/27/21   FVC 2.81(71%)  DLCO  13.2  (55%)   FV curve nl and ERV 56% at wt 204     - 09/06/2021   Walked on RA  x  3  lap(s) =  approx 450  ft  @ rapid pace, stopped due to end of study with lowest 02 sats 93%  s sob  - 03/11/2022   Walked on RA  x  450  lap(s) =  approx 450  ft  @ fast pace, stopped due to end of study  with lowest 02 sats 92% RA   - Chest CT   s contrast  05/25/22  Unchanged mild pulmonary fibrosis in a pattern with apical to basal gradient, featuring irregular peripheral interstitial opacity, septal thickening, traction bronchiectasis and small areas of subpleural bronchiolectasis of the lung bases. Findings are categorized as probable UIP per consensus guidelines   - PFT's 08/16/2022   FVC 2.76   (70%)  DLCO  12.46 (53%)  and FV curve mildly concave  and ERV 42%  at wt 200 lbs    - 12/05/2022   Walked on RA  x  3  lap(s) =  approx 450  ft  @ fast pace, stopped due to end of study  with lowest 02 sats 94%   Stable pattern of PF dating back at least 3 years with slt better 02 sats with exertion so advised to use this to track any progression and to call right away if downward trend noted in sats or ex tol .  Discussed in detail all the  indications, usual  risks and alternatives  relative to the benefits with patient who agrees to proceed with conservative f/u as outlined    F/u can be prn at this point.    Each  maintenance medication was reviewed in detail including emphasizing most importantly the difference between maintenance and prns and under what circumstances the prns are to be triggered using an action plan format where appropriate.  Total time for H and P, chart review, counseling,  directly observing portions of ambulatory 02 saturation study/ and generating customized AVS unique to this office visit / same day charting = 30 min summary f/u ov.

## 2022-12-06 DIAGNOSIS — M5441 Lumbago with sciatica, right side: Secondary | ICD-10-CM | POA: Diagnosis not present

## 2022-12-06 DIAGNOSIS — M9903 Segmental and somatic dysfunction of lumbar region: Secondary | ICD-10-CM | POA: Diagnosis not present

## 2022-12-06 DIAGNOSIS — M9905 Segmental and somatic dysfunction of pelvic region: Secondary | ICD-10-CM | POA: Diagnosis not present

## 2022-12-06 DIAGNOSIS — M5442 Lumbago with sciatica, left side: Secondary | ICD-10-CM | POA: Diagnosis not present

## 2022-12-06 DIAGNOSIS — M9902 Segmental and somatic dysfunction of thoracic region: Secondary | ICD-10-CM | POA: Diagnosis not present

## 2022-12-06 DIAGNOSIS — M9906 Segmental and somatic dysfunction of lower extremity: Secondary | ICD-10-CM | POA: Diagnosis not present

## 2022-12-12 ENCOUNTER — Telehealth: Payer: Self-pay | Admitting: Cardiology

## 2022-12-12 ENCOUNTER — Ambulatory Visit: Payer: Medicare PPO

## 2022-12-12 NOTE — Telephone Encounter (Signed)
Patient c/o Palpitations:  High priority if patient c/o lightheadedness, shortness of breath, or chest pain  How long have you had palpitations/irregular HR/ Afib? Are you having the symptoms now?  Palpitations for the past 1-2 weeks. Patient states he is having palpitations currently.  Are you currently experiencing lightheadedness, SOB or CP?  No   Do you have a history of afib (atrial fibrillation) or irregular heart rhythm?    Have you checked your BP or HR? (document readings if available):  Patient states his BP was high the last time it was taken but he doesn't have a way to regularly check HR/BP  Are you experiencing any other symptoms?   SOB--not currently

## 2022-12-12 NOTE — Telephone Encounter (Signed)
Patient states that for the past 1-2 weeks he has had palpitations more than normal and has also had the sensation of fast beats also.He notes especially when he gets up at night to go to the bathroom. He states these symptoms are brief,lasting about a minute.He also notes that he he is more SOB than usual His weight is consistent at 204 lbs.   He is currently not home but I will call him later today to get his BO and HR. I am thinking he may need a nurse appointment to have EKG done. He asks that I call him abck after 3 pm today at home number.

## 2022-12-12 NOTE — Telephone Encounter (Signed)
Patient had two episodes today of tachycardia,took BP 116/79, HR 127, and 109/76, HR 130   I have asked him to come into office tomorrow and get EKG at 0930

## 2022-12-13 ENCOUNTER — Ambulatory Visit: Payer: Medicare PPO | Attending: Cardiology

## 2022-12-13 ENCOUNTER — Ambulatory Visit: Payer: Medicare PPO | Attending: Physician Assistant

## 2022-12-13 ENCOUNTER — Other Ambulatory Visit (HOSPITAL_COMMUNITY)
Admission: RE | Admit: 2022-12-13 | Discharge: 2022-12-13 | Disposition: A | Discharge status: Home / Self Care | Payer: Medicare PPO | Source: Ambulatory Visit | Attending: Physician Assistant | Admitting: Physician Assistant

## 2022-12-13 VITALS — BP 126/82 | HR 75 | Ht 68.0 in | Wt 205.0 lb

## 2022-12-13 DIAGNOSIS — R000 Tachycardia, unspecified: Secondary | ICD-10-CM

## 2022-12-13 DIAGNOSIS — R002 Palpitations: Secondary | ICD-10-CM

## 2022-12-13 LAB — TSH: TSH: 3.496 u[IU]/mL (ref 0.350–4.500)

## 2022-12-13 LAB — CBC
HCT: 43.7 % (ref 39.0–52.0)
Hemoglobin: 14.1 g/dL (ref 13.0–17.0)
MCH: 28.3 pg (ref 26.0–34.0)
MCHC: 32.3 g/dL (ref 30.0–36.0)
MCV: 87.8 fL (ref 80.0–100.0)
Platelets: 209 10*3/uL (ref 150–400)
RBC: 4.98 MIL/uL (ref 4.22–5.81)
RDW: 14.3 % (ref 11.5–15.5)
WBC: 9.2 10*3/uL (ref 4.0–10.5)
nRBC: 0 % (ref 0.0–0.2)

## 2022-12-13 LAB — BASIC METABOLIC PANEL
Anion gap: 5 (ref 5–15)
BUN: 20 mg/dL (ref 8–23)
CO2: 25 mmol/L (ref 22–32)
Calcium: 8.9 mg/dL (ref 8.9–10.3)
Chloride: 105 mmol/L (ref 98–111)
Creatinine, Ser: 1.8 mg/dL — ABNORMAL HIGH (ref 0.61–1.24)
GFR, Estimated: 39 mL/min — ABNORMAL LOW (ref >60)
Glucose, Bld: 111 mg/dL — ABNORMAL HIGH (ref 70–99)
Potassium: 4.1 mmol/L (ref 3.5–5.1)
Sodium: 135 mmol/L (ref 135–145)

## 2022-12-13 NOTE — Patient Instructions (Signed)
Wear Zio monitor for 14 days. Remove monitor on 12/27/22  The [employer] company can be reached 24 hours a day at [phone]  Do not get wet for 24 hours. Tomorrow after 9 am you may take a shower.   Get blood work [facility] at [facility]   We will call you with results.

## 2022-12-13 NOTE — Progress Notes (Signed)
Patient had history of aortic  valve replacement and is is on Coumadin. Nurse visit today for EKG. Patient has has 2 weeks of intermittent SOB and palpitations with sensation of racing heart. BP check done at home  twice yesterday revealed HR 127 and 130 at two different times. Symptoms last 3-4 minutes   EKG done in office today is NSR, no ectopy noted   After discussion with M.Lenze,PA-C, patient will get lab work to include,CBC,BMET,and TSH and wear 14 day Zio monitor

## 2022-12-15 DIAGNOSIS — M9903 Segmental and somatic dysfunction of lumbar region: Secondary | ICD-10-CM | POA: Diagnosis not present

## 2022-12-15 DIAGNOSIS — M5441 Lumbago with sciatica, right side: Secondary | ICD-10-CM | POA: Diagnosis not present

## 2022-12-15 DIAGNOSIS — M9905 Segmental and somatic dysfunction of pelvic region: Secondary | ICD-10-CM | POA: Diagnosis not present

## 2022-12-15 DIAGNOSIS — M9906 Segmental and somatic dysfunction of lower extremity: Secondary | ICD-10-CM | POA: Diagnosis not present

## 2022-12-15 DIAGNOSIS — M9902 Segmental and somatic dysfunction of thoracic region: Secondary | ICD-10-CM | POA: Diagnosis not present

## 2022-12-15 DIAGNOSIS — M5442 Lumbago with sciatica, left side: Secondary | ICD-10-CM | POA: Diagnosis not present

## 2022-12-22 ENCOUNTER — Other Ambulatory Visit: Payer: Self-pay | Admitting: Cardiology

## 2022-12-26 DIAGNOSIS — K219 Gastro-esophageal reflux disease without esophagitis: Secondary | ICD-10-CM | POA: Diagnosis not present

## 2022-12-26 DIAGNOSIS — I10 Essential (primary) hypertension: Secondary | ICD-10-CM | POA: Diagnosis not present

## 2022-12-26 DIAGNOSIS — R3129 Other microscopic hematuria: Secondary | ICD-10-CM | POA: Diagnosis not present

## 2022-12-26 DIAGNOSIS — E785 Hyperlipidemia, unspecified: Secondary | ICD-10-CM | POA: Diagnosis not present

## 2022-12-26 DIAGNOSIS — I2510 Atherosclerotic heart disease of native coronary artery without angina pectoris: Secondary | ICD-10-CM | POA: Diagnosis not present

## 2022-12-26 DIAGNOSIS — I6381 Other cerebral infarction due to occlusion or stenosis of small artery: Secondary | ICD-10-CM | POA: Diagnosis not present

## 2022-12-26 DIAGNOSIS — N1832 Chronic kidney disease, stage 3b: Secondary | ICD-10-CM | POA: Diagnosis not present

## 2022-12-26 DIAGNOSIS — J430 Unilateral pulmonary emphysema [MacLeod's syndrome]: Secondary | ICD-10-CM | POA: Diagnosis not present

## 2022-12-26 DIAGNOSIS — I712 Thoracic aortic aneurysm, without rupture, unspecified: Secondary | ICD-10-CM | POA: Diagnosis not present

## 2022-12-29 DIAGNOSIS — M5441 Lumbago with sciatica, right side: Secondary | ICD-10-CM | POA: Diagnosis not present

## 2022-12-29 DIAGNOSIS — M9905 Segmental and somatic dysfunction of pelvic region: Secondary | ICD-10-CM | POA: Diagnosis not present

## 2022-12-29 DIAGNOSIS — M5442 Lumbago with sciatica, left side: Secondary | ICD-10-CM | POA: Diagnosis not present

## 2022-12-29 DIAGNOSIS — M9903 Segmental and somatic dysfunction of lumbar region: Secondary | ICD-10-CM | POA: Diagnosis not present

## 2022-12-29 DIAGNOSIS — M9902 Segmental and somatic dysfunction of thoracic region: Secondary | ICD-10-CM | POA: Diagnosis not present

## 2022-12-29 DIAGNOSIS — M9906 Segmental and somatic dysfunction of lower extremity: Secondary | ICD-10-CM | POA: Diagnosis not present

## 2023-01-03 DIAGNOSIS — R000 Tachycardia, unspecified: Secondary | ICD-10-CM | POA: Diagnosis not present

## 2023-01-03 DIAGNOSIS — R002 Palpitations: Secondary | ICD-10-CM | POA: Diagnosis not present

## 2023-01-11 ENCOUNTER — Ambulatory Visit: Payer: Medicare PPO | Attending: Cardiology

## 2023-01-11 DIAGNOSIS — Z952 Presence of prosthetic heart valve: Secondary | ICD-10-CM

## 2023-01-11 DIAGNOSIS — Z5181 Encounter for therapeutic drug level monitoring: Secondary | ICD-10-CM

## 2023-01-11 DIAGNOSIS — I359 Nonrheumatic aortic valve disorder, unspecified: Secondary | ICD-10-CM | POA: Diagnosis not present

## 2023-01-11 LAB — POCT INR: INR: 2.4 (ref 2.0–3.0)

## 2023-01-11 NOTE — Patient Instructions (Signed)
Description   Continue warfarin 1 tablet daily except 1/2 tablet on Sundays, Tuesdays and Thursdays Recheck in 6 weeks       

## 2023-01-17 DIAGNOSIS — M9906 Segmental and somatic dysfunction of lower extremity: Secondary | ICD-10-CM | POA: Diagnosis not present

## 2023-01-17 DIAGNOSIS — M5442 Lumbago with sciatica, left side: Secondary | ICD-10-CM | POA: Diagnosis not present

## 2023-01-17 DIAGNOSIS — M9902 Segmental and somatic dysfunction of thoracic region: Secondary | ICD-10-CM | POA: Diagnosis not present

## 2023-01-17 DIAGNOSIS — M9905 Segmental and somatic dysfunction of pelvic region: Secondary | ICD-10-CM | POA: Diagnosis not present

## 2023-01-17 DIAGNOSIS — M9903 Segmental and somatic dysfunction of lumbar region: Secondary | ICD-10-CM | POA: Diagnosis not present

## 2023-01-17 DIAGNOSIS — M5441 Lumbago with sciatica, right side: Secondary | ICD-10-CM | POA: Diagnosis not present

## 2023-01-18 ENCOUNTER — Telehealth: Payer: Self-pay | Admitting: *Deleted

## 2023-01-18 DIAGNOSIS — I10 Essential (primary) hypertension: Secondary | ICD-10-CM | POA: Diagnosis not present

## 2023-01-18 DIAGNOSIS — N1832 Chronic kidney disease, stage 3b: Secondary | ICD-10-CM | POA: Diagnosis not present

## 2023-01-18 DIAGNOSIS — Z0001 Encounter for general adult medical examination with abnormal findings: Secondary | ICD-10-CM | POA: Diagnosis not present

## 2023-01-18 DIAGNOSIS — Z952 Presence of prosthetic heart valve: Secondary | ICD-10-CM | POA: Diagnosis not present

## 2023-01-18 DIAGNOSIS — E785 Hyperlipidemia, unspecified: Secondary | ICD-10-CM | POA: Diagnosis not present

## 2023-01-18 DIAGNOSIS — I471 Supraventricular tachycardia, unspecified: Secondary | ICD-10-CM | POA: Diagnosis not present

## 2023-01-18 DIAGNOSIS — J8410 Pulmonary fibrosis, unspecified: Secondary | ICD-10-CM | POA: Diagnosis not present

## 2023-01-18 DIAGNOSIS — I712 Thoracic aortic aneurysm, without rupture, unspecified: Secondary | ICD-10-CM | POA: Diagnosis not present

## 2023-01-18 DIAGNOSIS — R002 Palpitations: Secondary | ICD-10-CM

## 2023-01-18 MED ORDER — METOPROLOL SUCCINATE ER 50 MG PO TB24: 50.0000 mg | ORAL_TABLET | Freq: Every day | ORAL | 4 refills | Status: AC

## 2023-01-18 NOTE — Addendum Note (Signed)
Addended by: Eustace Moore on: 01/18/2023 10:36 AM   Modules accepted: Orders

## 2023-01-18 NOTE — Telephone Encounter (Signed)
I spoke with Dr. [name] regarding Stephen Moses.  Patient continuing to report intermittent palpitations as before.  I reviewed the chart.  ZIO monitor ordered by Ms. Geni Bers PA-C following nurse visit in [place].  Patient had not yet heard the results.  Monitor showed multiple episodes of PSVT, some of which were persistent, the longest of which was 2 hours and 48 minutes.  He is already on Toprol-XL 25 mg daily, resting heart rate 70-80 today.  Plan to increase Toprol-XL to 50 mg daily and get him set up for EP consultation as a suspect he will either need antiarrhythmic therapy or possibly consideration of ablation.

## 2023-01-18 NOTE — Telephone Encounter (Signed)
Dr. [name] is requesting a speak with Diona Browner per Marisue Ivan. Per Marisue Ivan, she is unaware of what its relating to but says patient was seen in the office today by Dr. [name]. Obtained call back number [phone]) and sent to provider.

## 2023-01-23 NOTE — Telephone Encounter (Signed)
Patient informed and verbalized understanding of plan. 

## 2023-01-25 ENCOUNTER — Encounter: Payer: Self-pay | Admitting: Cardiovascular Disease

## 2023-01-25 ENCOUNTER — Ambulatory Visit (INDEPENDENT_AMBULATORY_CARE_PROVIDER_SITE_OTHER): Payer: Medicare PPO

## 2023-01-25 ENCOUNTER — Ambulatory Visit: Payer: Medicare PPO | Attending: Cardiovascular Disease | Admitting: Cardiovascular Disease

## 2023-01-25 VITALS — BP 126/64 | HR 58 | Ht 68.0 in | Wt 206.0 lb

## 2023-01-25 DIAGNOSIS — I471 Supraventricular tachycardia, unspecified: Secondary | ICD-10-CM | POA: Diagnosis not present

## 2023-01-25 DIAGNOSIS — J84112 Idiopathic pulmonary fibrosis: Secondary | ICD-10-CM

## 2023-01-25 NOTE — Patient Instructions (Signed)
Medication Instructions:  Your physician recommends that you continue on your current medications as directed. Please refer to the Current Medication list given to you today. *If you need a refill on your cardiac medications before your next appointment, please call your pharmacy*  Testing/Procedures: Zio Cardiac Monitor - wear for 3 days Your physician has recommended that you wear an event monitor. Event monitors are medical devices that record the heart's electrical activity. Doctors most often [place] these monitors to diagnose arrhythmias. Arrhythmias are problems with the speed or rhythm of the heartbeat. The monitor is a small, portable device. You can wear one while you do your normal daily activities. This is usually used to diagnose what is causing palpitations/syncope (passing out).   Follow-Up: At [facility], you and your health needs are our priority.  As part of our continuing mission to provide you with exceptional heart care, we have created designated Provider Care Teams.  These Care Teams include your primary Cardiologist (physician) and Advanced Practice Providers (APPs -  Physician Assistants and Nurse Practitioners) who all work together to provide you with the care you need, when you need it.  We recommend signing up for the patient portal called "MyChart".  Sign up information is provided on this After Visit Summary.  MyChart is used to connect with patients for Virtual Visits (Telemedicine).  Patients are able to view lab/test results, encounter notes, upcoming appointments, etc.  Non-urgent messages can be sent to your provider as well.   To learn more about what you can do with MyChart, go to [email or web address].    Your next appointment:   1 - 2 month(s)  Provider:   York Pellant, MD   Christena Deem- Long Term Monitor Instructions  Your physician has requested you wear a ZIO patch monitor for 14 days.  This is a single patch monitor. [employer] supplies one patch  monitor per enrollment. Additional stickers are not available. Please do not apply patch if you will be having a Nuclear Stress Test,  Echocardiogram, Cardiac CT, MRI, or Chest Xray during the period you would be wearing the  monitor. The patch cannot be worn during these tests. You cannot remove and re-apply the  ZIO XT patch monitor.  Your ZIO patch monitor will be mailed 3 day USPS to your address on file. It may take 3-5 days  to receive your monitor after you have been enrolled.  Once you have received your monitor, please review the enclosed instructions. Your monitor  has already been registered assigning a specific monitor serial # to you.  Billing and Patient Assistance Program Information  We have supplied [employer] with any of your insurance information on file for billing purposes. [employer] offers a sliding scale Patient Assistance Program for patients that do not have  insurance, or whose insurance does not completely cover the cost of the ZIO monitor.  You must apply for the Patient Assistance Program to qualify for this discounted rate.  To apply, please call [employer] at [phone], select option 4, select option 2, ask to apply for  Patient Assistance Program. Meredeth Ide will ask your household income, and how many people  are in your household. They will quote your out-of-pocket cost based on that information.  [employer] will also be able to set up a 64-month, interest-free payment plan if needed.  Applying the monitor   Shave hair from upper left chest.  Hold abrader disc by orange tab. Rub abrader in 40 strokes over the upper left chest as  indicated in your monitor instructions.  Clean area with 4 enclosed alcohol pads. Let dry.  Apply patch as indicated in monitor instructions. Patch will be placed under collarbone on left  side of chest with arrow pointing upward.  Rub patch adhesive wings for 2 minutes. Remove white label marked "1". Remove the white  label marked "2".  Rub patch adhesive wings for 2 additional minutes.  While looking in a mirror, press and release button in center of patch. A small green light will  flash 3-4 times. This will be your only indicator that the monitor has been turned on.  Do not shower for the first 24 hours. You may shower after the first 24 hours.  Press the button if you feel a symptom. You will hear a small click. Record Date, Time and  Symptom in the Patient Logbook.  When you are ready to remove the patch, follow instructions on the last 2 pages of Patient  Logbook. Stick patch monitor onto the last page of Patient Logbook.  Place Patient Logbook in the blue and white box. Use locking tab on box and tape box closed  securely. The blue and white box has prepaid postage on it. Please place it in the mailbox as  soon as possible. Your physician should have your test results approximately 7 days after the  monitor has been mailed back to [facility].  Call [facility] Customer Care at [phone] if you have questions regarding  your ZIO XT patch monitor. Call them immediately if you see an orange light blinking on your  monitor.  If your monitor falls off in less than 4 days, contact our Monitor department at [phone].  If your monitor becomes loose or falls off after 4 days call [employer] at [phone] for  suggestions on securing your monitor

## 2023-01-25 NOTE — Progress Notes (Signed)
Electrophysiology Office Note:    Date:  01/25/2023   ID:  Saw, Perelli 12-17-1945, MRN [id number]  PCP:  Carylon Perches, MD   [facility] Providers Cardiologist:  Nona Dell, MD     Referring MD: Jonelle Sidle, MD   History of Present Illness:    Stephen Moses is a 77 y.o. male with a medical history significant for replacement of a bicuspid aortic valve with Saint Jude mechanical valve, hypertension, referred for arrhythmia management.     I discussed the use of AI scribe software for clinical note transcription with the patient, who gave verbal consent to proceed.  Patient has noticed increased heart rates.  He first noticed these after getting up going to the bathroom in [place].  When he laid back down in bed he appreciated elevated heart rate, which he is aware of due to his mechanical aortic valve.  He reports that he has not had any associated lightheadedness, dizziness, chest discomfort with these elevated heart rates.  A ZIO monitor was placed that showed episodes of atrial tachycardia.  Some these episodes were sustained, lasting up to almost 3 hours.  Symptom episodes correlated with atrial tachycardia.  His average rate and tachycardia, however, was about 114 bpm.  His metoprolol was increased.  Since the medication change, he has not appreciated elevated heart rates.     Today, he is at baseline reports that he feels well.  He has no complaints EKGs/Labs/Other Studies Reviewed Today:     Echocardiogram:  TTE 03/11/2022 EF 50-55%. Grade I diastolic dysfunction. Mild MR.  23mm mechanical St. Jude aortic valve   Monitors:  Zio 14d 11/2022  -- my interpretation Sinus rhythm heart rate 47 to 118 bpm, average 74 There are over thousand episodes of SVT -most of these appear to be episodes of atrial tachycardia with rates just above 100 bpm, often with AV Wenkebach.  The longest episode lasted nearly 3 hours.    EKG:   EKG  Interpretation Date/Time:  Wednesday January 25 2023 13:35:38 EST Ventricular Rate:  58 PR Interval:  216 QRS Duration:  96 QT Interval:  440 QTC Calculation: 431 R Axis:   66  Text Interpretation: Sinus bradycardia with 1st degree A-V block with occasional Premature ventricular complexes When compared with ECG of 13-Dec-2022 09:18, Premature ventricular complexes are now Present QT has shortened Confirmed by York Pellant [phone]) on 01/25/2023 1:48:34 PM     Physical Exam:    VS:  BP 126/64   Pulse (!) 58   Ht 5\' 8"  (1.727 m)   Wt 206 lb (93.4 kg)   SpO2 95%   BMI 31.32 kg/m     Wt Readings from Last 3 Encounters:  01/25/23 206 lb (93.4 kg)  12/13/22 205 lb (93 kg)  12/05/22 204 lb (92.5 kg)     GEN:  Well nourished, well developed in no acute distress CARDIAC: RRR, no murmurs, rubs, gallops RESPIRATORY:  Normal work of breathing MUSCULOSKELETAL: no edema    ASSESSMENT & PLAN:     Atrial tachycardia Minimally symptomatic.  It seems he would not be aware unless he had a mechanical valve. I did not see any potentially malignant arrhythmias such as atrial fibrillation or AVRT Symptoms have improved with increase in metoprolol I do not see any urgent indication for antiarrhythmic drug or ablation with minimally symptomatic atrial tachycardia and rates that are largely controlled. With atrial tachycardia, he has increased risk of progression to atrial fibrillation, but  fortunately he is already on warfarin for his mechanical aortic valve. I will repeat a 3-day ZIO monitor to evaluate his rhythm with increased dose of metoprolol  History of mechanical aortic valve On warfarin  History of IPF Would avoid amiodarone if antiarrhythmic drug is ever needed    Signed, Maurice Small, MD  01/25/2023 1:54 PM    [facility]

## 2023-01-25 NOTE — Progress Notes (Unsigned)
Applied a 3 day Zio XT to patient in the office

## 2023-01-31 DIAGNOSIS — M9903 Segmental and somatic dysfunction of lumbar region: Secondary | ICD-10-CM | POA: Diagnosis not present

## 2023-01-31 DIAGNOSIS — M9902 Segmental and somatic dysfunction of thoracic region: Secondary | ICD-10-CM | POA: Diagnosis not present

## 2023-01-31 DIAGNOSIS — M9905 Segmental and somatic dysfunction of pelvic region: Secondary | ICD-10-CM | POA: Diagnosis not present

## 2023-01-31 DIAGNOSIS — M9906 Segmental and somatic dysfunction of lower extremity: Secondary | ICD-10-CM | POA: Diagnosis not present

## 2023-01-31 DIAGNOSIS — M5441 Lumbago with sciatica, right side: Secondary | ICD-10-CM | POA: Diagnosis not present

## 2023-01-31 DIAGNOSIS — M5442 Lumbago with sciatica, left side: Secondary | ICD-10-CM | POA: Diagnosis not present

## 2023-02-03 DIAGNOSIS — I4719 Other supraventricular tachycardia: Secondary | ICD-10-CM | POA: Diagnosis not present

## 2023-02-21 DIAGNOSIS — M9902 Segmental and somatic dysfunction of thoracic region: Secondary | ICD-10-CM | POA: Diagnosis not present

## 2023-02-21 DIAGNOSIS — M9905 Segmental and somatic dysfunction of pelvic region: Secondary | ICD-10-CM | POA: Diagnosis not present

## 2023-02-21 DIAGNOSIS — M9906 Segmental and somatic dysfunction of lower extremity: Secondary | ICD-10-CM | POA: Diagnosis not present

## 2023-02-21 DIAGNOSIS — M9903 Segmental and somatic dysfunction of lumbar region: Secondary | ICD-10-CM | POA: Diagnosis not present

## 2023-02-21 DIAGNOSIS — M5441 Lumbago with sciatica, right side: Secondary | ICD-10-CM | POA: Diagnosis not present

## 2023-02-21 DIAGNOSIS — M5442 Lumbago with sciatica, left side: Secondary | ICD-10-CM | POA: Diagnosis not present

## 2023-02-24 ENCOUNTER — Ambulatory Visit: Payer: Medicare PPO | Attending: Cardiology | Admitting: *Deleted

## 2023-02-24 DIAGNOSIS — Z952 Presence of prosthetic heart valve: Secondary | ICD-10-CM

## 2023-02-24 DIAGNOSIS — Z5181 Encounter for therapeutic drug level monitoring: Secondary | ICD-10-CM | POA: Diagnosis not present

## 2023-02-24 LAB — POCT INR: INR: 2.1 (ref 2.0–3.0)

## 2023-02-24 NOTE — Patient Instructions (Signed)
 Continue warfarin 1 tablet daily except 1/2 tablet on Sundays, Tuesdays and Thursdays Recheck in 6 weeks Discussing having back surgery next month.  Not scheduled yet.

## 2023-03-02 NOTE — Progress Notes (Signed)
 Electrophysiology Office Note:    Date:  03/03/2023   ID:  Stephen Moses 01/28/1946, MRN [id number]  PCP:  Sheryle Carwin, MD   [facility] Providers Cardiologist:  Jayson Sierras, MD Electrophysiologist:  Eulas FORBES Furbish, MD     Referring MD: Sheryle Carwin, MD   History of Present Illness:    Stephen Moses is a 78 y.o. male with a medical history significant for replacement of a bicuspid aortic valve with Saint Jude mechanical valve, hypertension, referred for arrhythmia management.     I discussed the use of AI scribe software for clinical note transcription with the patient, who gave verbal consent to proceed.  Patient has noticed increased heart rates.  He first noticed these after getting up going to the bathroom in the middle of the night.  When he laid back down in bed he appreciated an elevated heart rate, which he is aware of due to his mechanical aortic valve.  He reports that he has not had any associated lightheadedness, dizziness, chest discomfort with these elevated heart rates.  A ZIO monitor was placed that showed episodes of atrial tachycardia.  Some these episodes were sustained, lasting up to almost 3 hours.  Symptom episodes correlated with atrial tachycardia.  His average rate and tachycardia, however, was about 114 bpm.  His metoprolol  was increased.  Since the medication change, he has not appreciated elevated heart rates. Repeat Zio monitor showed some AT episodes, but the longest was only about 6 minutes. He remains asymptomatic.     A zio monitor was placed at his last visit, and he is here today to discuss the results.  Today, he is at baseline reports that he feels well.  He has no complaints EKGs/Labs/Other Studies Reviewed Today:     Echocardiogram:  TTE 03/11/2022 EF 50-55%. Grade I diastolic dysfunction. Mild MR.  23mm mechanical St. Jude aortic valve   Monitors:  Zio 14d 11/2022  -- my interpretation Sinus rhythm heart rate 47  to 118 bpm, average 74 There are over thousand episodes of SVT -most of these appear to be episodes of atrial tachycardia with rates just above 100 bpm, often with AV Wenkebach.  The longest episode lasted nearly 3 hours.     Zio monitor 02/2023      3 day monitor   Predominant rhythm: sinus Sinus HR: 46 - 118 bpm, AVG 67 bpm   rare atrial ectopy occasional ventricular ectopy (1.3%)   Arrhythmia detected: Atrial tachycardia (best seen with variable block on strip 01/26/23 01:24:35 AM). 27 episodes occurred. The longest was 82m 12s. The rate was approximately 150 bpm.   Patient triggered events: no symptom events reported    EKG:         Physical Exam:    VS:  BP 120/70   Pulse 72   Ht 5' 7 (1.702 m)   Wt 206 lb (93.4 kg)   SpO2 98%   BMI 32.26 kg/m     Wt Readings from Last 3 Encounters:  03/03/23 206 lb (93.4 kg)  01/25/23 206 lb (93.4 kg)  12/13/22 205 lb (93 kg)     GEN:  Well nourished, well developed in no acute distress CARDIAC: RRR, no murmurs, rubs, gallops RESPIRATORY:  Normal work of breathing MUSCULOSKELETAL: no edema    ASSESSMENT & PLAN:     Atrial tachycardia Minimally symptomatic.  It seems he would not be aware unless he had a mechanical valve. I did not see any potentially  malignant arrhythmias such as atrial fibrillation or AVRT Symptoms have improved with increase in metoprolol  I do not see any urgent indication for antiarrhythmic drug or ablation with brief, minimally symptomatic atrial tachycardia. With atrial tachycardia, he has increased risk of progression to atrial fibrillation, but fortunately he is already on warfarin for his mechanical aortic valve.  History of mechanical aortic valve On warfarin  History of IPF Would avoid amiodarone if antiarrhythmic drug is ever needed    Signed, Eulas FORBES Furbish, MD  03/03/2023 9:25 AM    [facility]

## 2023-03-03 ENCOUNTER — Ambulatory Visit: Payer: Medicare PPO | Admitting: Cardiovascular Disease

## 2023-03-03 ENCOUNTER — Ambulatory Visit: Payer: Medicare PPO | Attending: Cardiovascular Disease | Admitting: Cardiovascular Disease

## 2023-03-03 ENCOUNTER — Encounter: Payer: Self-pay | Admitting: Cardiovascular Disease

## 2023-03-03 VITALS — BP 120/70 | HR 72 | Ht 67.0 in | Wt 206.0 lb

## 2023-03-03 DIAGNOSIS — Z952 Presence of prosthetic heart valve: Secondary | ICD-10-CM

## 2023-03-03 DIAGNOSIS — Z7901 Long term (current) use of anticoagulants: Secondary | ICD-10-CM | POA: Diagnosis not present

## 2023-03-03 DIAGNOSIS — R002 Palpitations: Secondary | ICD-10-CM | POA: Diagnosis not present

## 2023-03-03 DIAGNOSIS — I4719 Other supraventricular tachycardia: Secondary | ICD-10-CM | POA: Diagnosis not present

## 2023-03-03 NOTE — Patient Instructions (Addendum)

## 2023-03-06 ENCOUNTER — Other Ambulatory Visit: Payer: Self-pay | Admitting: Cardiology

## 2023-03-06 NOTE — Telephone Encounter (Signed)
 Refill request for warfarin:  Last INR was 2.1 on 02/24/23 Next INR due 04/10/23 LOV was 03/03/23  Refill approved.

## 2023-03-07 ENCOUNTER — Other Ambulatory Visit: Payer: Self-pay | Admitting: Cardiology

## 2023-03-13 ENCOUNTER — Ambulatory Visit: Payer: Medicare PPO | Admitting: Internal Medicine

## 2023-03-14 ENCOUNTER — Encounter: Payer: Self-pay | Admitting: Cardiology

## 2023-03-14 ENCOUNTER — Ambulatory Visit: Payer: Medicare PPO | Attending: Cardiology | Admitting: Cardiology

## 2023-03-14 VITALS — BP 130/88 | HR 67 | Ht 67.0 in | Wt 204.6 lb

## 2023-03-14 DIAGNOSIS — I10 Essential (primary) hypertension: Secondary | ICD-10-CM | POA: Diagnosis not present

## 2023-03-14 DIAGNOSIS — Z952 Presence of prosthetic heart valve: Secondary | ICD-10-CM

## 2023-03-14 DIAGNOSIS — Q2381 Bicuspid aortic valve: Secondary | ICD-10-CM | POA: Diagnosis not present

## 2023-03-14 DIAGNOSIS — I350 Nonrheumatic aortic (valve) stenosis: Secondary | ICD-10-CM

## 2023-03-14 DIAGNOSIS — Z01810 Encounter for preprocedural cardiovascular examination: Secondary | ICD-10-CM

## 2023-03-14 NOTE — Patient Instructions (Signed)
Medication Instructions:  Your physician recommends that you continue on your current medications as directed. Please refer to the Current Medication list given to you today.   Labwork: None today   Testing/Procedures: None today   Follow-Up: 6 months with Dr.McDowell  Any Other Special Instructions Will Be Listed Below (If Applicable).  If you need a refill on your cardiac medications before your next appointment, please call your pharmacy.  

## 2023-03-14 NOTE — Progress Notes (Signed)
Cardiology Office Note  Date: 03/14/2023   ID: Stephen Moses, Stephen Moses Jul 21, 1945, MRN [id number]  History of Present Illness: Stephen Moses is a 78 y.o. male last seen in January 2024.  He is here today with family member for a follow-up visit.  I reviewed the interval chart.  He has been diagnosed with PSVT in the interim, at this point not overly symptomatic however and tolerating Toprol-XL which was most recently increased to 50 mg daily by Dr. Nelly Laurence.  He does not describe any angina.  Reports NYHA class II-III dyspnea depending on level of activity, overall stable.  He has had no dizziness or syncope.  He remains on Coumadin with follow-up in the anticoagulation clinic.  He is also on Norvasc, Lipitor, and Toprol-XL.  I reviewed his recent ECG which is noted below.  He had an echocardiogram in January of last year showing LVEF 50 to 55% with stable mechanical AVR, mean gradient 13 mmHg and no significant aortic regurgitation.  He tells me that he is considering back surgery and has consultation with Dr. Shelle Iron in the near future.  RCRI perioperative cardiac risk index is 1 point, 6% chance of major adverse cardiac event.  He would need to have Lovenox bridging while off Coumadin.  Physical Exam: VS:  BP 130/88   Pulse 67   Ht 5\' 7"  (1.702 m)   Wt 204 lb 9.6 oz (92.8 kg)   SpO2 96%   BMI 32.04 kg/m , BMI Body mass index is 32.04 kg/m.  Wt Readings from Last 3 Encounters:  03/14/23 204 lb 9.6 oz (92.8 kg)  03/03/23 206 lb (93.4 kg)  01/25/23 206 lb (93.4 kg)    General: Patient appears comfortable at rest. HEENT: Conjunctiva and lids normal. Neck: Supple, no elevated JVP or carotid bruits. Lungs: Clear to auscultation, nonlabored breathing at rest. Cardiac: Regular rate and rhythm, no S3, 3/6 systolic murmur with mild/crisp prosthetic click in S2. Extremities: No pitting edema.  ECG:  An ECG dated 01/25/2023 was personally reviewed today and demonstrated:  Sinus  bradycardia with PVCs.  Labwork: 12/13/2022: BUN 20; Creatinine, Ser 1.80; Hemoglobin 14.1; Platelets 209; Potassium 4.1; Sodium 135; TSH 3.496  November 2024: Hemoglobin 14.2, platelets 224, BUN 18, creatinine 1.93, potassium 4.7, AST 24, ALT 18, GFR 35, cholesterol 120, glycerides 56, HDL 55, LDL 52  Other Studies Reviewed Today:  No interval cardiac testing for review today.  Assessment and Plan:  1.  History of bicuspid aortic valve with aortic stenosis and ascending aortic aneurysm status post 23 mm St. Jude AVR with ascending conduit and reimplantation of coronaries (normal at cardiac catheterization) in 2010.  Follow-up chest CTA in April of last year showed stable caliber native aorta and the proximal arch measuring 4.7 x 4.4 cm (he continues to follow with Dr. Laneta Simmers annually).  Follow-up echocardiogram in January 2024 showed stable AVR function with mean gradient 13 mmHg and no significant aortic regurgitation.  Plan to continue Coumadin with follow-up in the anticoagulation clinic.  2.  Patient considering back surgery, he has consultation with Dr. Shelle Iron in the near future.  I do not have any other details at this time.  RCRI perioperative cardiac risk index suggests approximately 6% chance of major adverse cardiac event.  Suspect he would be able to proceed most likely without further cardiac testing based on current description of symptoms.  He is on Coumadin and would need to have a Lovenox bridge arranged through our anticoagulation clinic.  3.  Primary hypertension.  No change in current regimen including Norvasc and Toprol-XL.  4.  PSVT documented by cardiac monitor in November 2024.  He was seen by Dr. Nelly Laurence recently with plan to continue beta-blocker therapy.  5.  CKD stage IIIb, creatinine 1.93 in November 2024.  Disposition:  Follow up  6 months.  Signed, Jonelle Sidle, M.D., F.A.C.C. [facility]

## 2023-03-20 DIAGNOSIS — M9902 Segmental and somatic dysfunction of thoracic region: Secondary | ICD-10-CM | POA: Diagnosis not present

## 2023-03-20 DIAGNOSIS — M9905 Segmental and somatic dysfunction of pelvic region: Secondary | ICD-10-CM | POA: Diagnosis not present

## 2023-03-20 DIAGNOSIS — M5441 Lumbago with sciatica, right side: Secondary | ICD-10-CM | POA: Diagnosis not present

## 2023-03-20 DIAGNOSIS — M9906 Segmental and somatic dysfunction of lower extremity: Secondary | ICD-10-CM | POA: Diagnosis not present

## 2023-03-20 DIAGNOSIS — M5442 Lumbago with sciatica, left side: Secondary | ICD-10-CM | POA: Diagnosis not present

## 2023-03-20 DIAGNOSIS — M9903 Segmental and somatic dysfunction of lumbar region: Secondary | ICD-10-CM | POA: Diagnosis not present

## 2023-03-21 DIAGNOSIS — M51369 Other intervertebral disc degeneration, lumbar region without mention of lumbar back pain or lower extremity pain: Secondary | ICD-10-CM | POA: Diagnosis not present

## 2023-03-21 DIAGNOSIS — M5451 Vertebrogenic low back pain: Secondary | ICD-10-CM | POA: Diagnosis not present

## 2023-03-21 DIAGNOSIS — M5459 Other low back pain: Secondary | ICD-10-CM | POA: Diagnosis not present

## 2023-04-03 ENCOUNTER — Other Ambulatory Visit (HOSPITAL_COMMUNITY): Payer: Self-pay | Admitting: Specialist

## 2023-04-03 DIAGNOSIS — M5459 Other low back pain: Secondary | ICD-10-CM

## 2023-04-10 ENCOUNTER — Ambulatory Visit: Payer: Medicare PPO | Attending: Cardiology | Admitting: *Deleted

## 2023-04-10 DIAGNOSIS — Z5181 Encounter for therapeutic drug level monitoring: Secondary | ICD-10-CM

## 2023-04-10 DIAGNOSIS — Z952 Presence of prosthetic heart valve: Secondary | ICD-10-CM | POA: Diagnosis not present

## 2023-04-10 LAB — POCT INR: INR: 3.4 — AB (ref 2.0–3.0)

## 2023-04-10 NOTE — Patient Instructions (Signed)
 Hold warfarin tonight then resume 1 tablet daily except 1/2 tablet on Sundays, Tuesdays and Thursdays Recheck in 4 weeks

## 2023-04-11 ENCOUNTER — Ambulatory Visit (HOSPITAL_COMMUNITY)
Admission: RE | Admit: 2023-04-11 | Discharge: 2023-04-11 | Disposition: A | Discharge status: Home / Self Care | Payer: Medicare PPO | Source: Ambulatory Visit | Attending: Specialist | Admitting: Specialist

## 2023-04-11 DIAGNOSIS — M47816 Spondylosis without myelopathy or radiculopathy, lumbar region: Secondary | ICD-10-CM | POA: Diagnosis not present

## 2023-04-11 DIAGNOSIS — M5459 Other low back pain: Secondary | ICD-10-CM | POA: Diagnosis not present

## 2023-04-11 DIAGNOSIS — M5135 Other intervertebral disc degeneration, thoracolumbar region: Secondary | ICD-10-CM | POA: Diagnosis not present

## 2023-04-11 DIAGNOSIS — M5126 Other intervertebral disc displacement, lumbar region: Secondary | ICD-10-CM | POA: Diagnosis not present

## 2023-04-11 DIAGNOSIS — M48061 Spinal stenosis, lumbar region without neurogenic claudication: Secondary | ICD-10-CM | POA: Diagnosis not present

## 2023-04-17 DIAGNOSIS — M9906 Segmental and somatic dysfunction of lower extremity: Secondary | ICD-10-CM | POA: Diagnosis not present

## 2023-04-17 DIAGNOSIS — M9902 Segmental and somatic dysfunction of thoracic region: Secondary | ICD-10-CM | POA: Diagnosis not present

## 2023-04-17 DIAGNOSIS — M5442 Lumbago with sciatica, left side: Secondary | ICD-10-CM | POA: Diagnosis not present

## 2023-04-17 DIAGNOSIS — M9903 Segmental and somatic dysfunction of lumbar region: Secondary | ICD-10-CM | POA: Diagnosis not present

## 2023-04-17 DIAGNOSIS — M5441 Lumbago with sciatica, right side: Secondary | ICD-10-CM | POA: Diagnosis not present

## 2023-04-17 DIAGNOSIS — M9905 Segmental and somatic dysfunction of pelvic region: Secondary | ICD-10-CM | POA: Diagnosis not present

## 2023-04-18 DIAGNOSIS — C44212 Basal cell carcinoma of skin of right ear and external auricular canal: Secondary | ICD-10-CM | POA: Diagnosis not present

## 2023-04-18 DIAGNOSIS — D485 Neoplasm of uncertain behavior of skin: Secondary | ICD-10-CM | POA: Diagnosis not present

## 2023-04-18 DIAGNOSIS — L570 Actinic keratosis: Secondary | ICD-10-CM | POA: Diagnosis not present

## 2023-04-18 DIAGNOSIS — L98499 Non-pressure chronic ulcer of skin of other sites with unspecified severity: Secondary | ICD-10-CM | POA: Diagnosis not present

## 2023-04-27 ENCOUNTER — Other Ambulatory Visit: Payer: Self-pay | Admitting: Surgery

## 2023-04-27 DIAGNOSIS — I7121 Aneurysm of the ascending aorta, without rupture: Secondary | ICD-10-CM

## 2023-04-27 DIAGNOSIS — C44319 Basal cell carcinoma of skin of other parts of face: Secondary | ICD-10-CM | POA: Diagnosis not present

## 2023-04-28 DIAGNOSIS — M5451 Vertebrogenic low back pain: Secondary | ICD-10-CM | POA: Diagnosis not present

## 2023-05-03 ENCOUNTER — Other Ambulatory Visit: Payer: Self-pay | Admitting: Surgery

## 2023-05-03 DIAGNOSIS — I7121 Aneurysm of the ascending aorta, without rupture: Secondary | ICD-10-CM

## 2023-05-05 DIAGNOSIS — M5416 Radiculopathy, lumbar region: Secondary | ICD-10-CM | POA: Insufficient documentation

## 2023-05-08 ENCOUNTER — Ambulatory Visit: Payer: Medicare PPO | Attending: Cardiology | Admitting: *Deleted

## 2023-05-08 DIAGNOSIS — Z5181 Encounter for therapeutic drug level monitoring: Secondary | ICD-10-CM | POA: Diagnosis not present

## 2023-05-08 DIAGNOSIS — Z952 Presence of prosthetic heart valve: Secondary | ICD-10-CM

## 2023-05-08 LAB — POCT INR: INR: 2.8 (ref 2.0–3.0)

## 2023-05-08 NOTE — Patient Instructions (Signed)
Continue warfarin 1 tablet daily except 1/2 tablet on Sundays, Tuesdays and Thursdays.  Recheck in 4 weeks 

## 2023-05-09 DIAGNOSIS — M5416 Radiculopathy, lumbar region: Secondary | ICD-10-CM | POA: Diagnosis not present

## 2023-05-11 DIAGNOSIS — I6381 Other cerebral infarction due to occlusion or stenosis of small artery: Secondary | ICD-10-CM | POA: Diagnosis not present

## 2023-05-11 DIAGNOSIS — I10 Essential (primary) hypertension: Secondary | ICD-10-CM | POA: Diagnosis not present

## 2023-05-11 DIAGNOSIS — R7301 Impaired fasting glucose: Secondary | ICD-10-CM | POA: Diagnosis not present

## 2023-05-11 DIAGNOSIS — I700 Atherosclerosis of aorta: Secondary | ICD-10-CM | POA: Diagnosis not present

## 2023-05-11 DIAGNOSIS — N1832 Chronic kidney disease, stage 3b: Secondary | ICD-10-CM | POA: Diagnosis not present

## 2023-05-15 DIAGNOSIS — M545 Low back pain, unspecified: Secondary | ICD-10-CM | POA: Diagnosis not present

## 2023-05-15 DIAGNOSIS — M5451 Vertebrogenic low back pain: Secondary | ICD-10-CM | POA: Diagnosis not present

## 2023-05-18 DIAGNOSIS — Z952 Presence of prosthetic heart valve: Secondary | ICD-10-CM | POA: Diagnosis not present

## 2023-05-18 DIAGNOSIS — I471 Supraventricular tachycardia, unspecified: Secondary | ICD-10-CM | POA: Diagnosis not present

## 2023-05-18 DIAGNOSIS — N1832 Chronic kidney disease, stage 3b: Secondary | ICD-10-CM | POA: Diagnosis not present

## 2023-05-18 DIAGNOSIS — J84112 Idiopathic pulmonary fibrosis: Secondary | ICD-10-CM | POA: Diagnosis not present

## 2023-05-19 DIAGNOSIS — M545 Low back pain, unspecified: Secondary | ICD-10-CM | POA: Diagnosis not present

## 2023-05-19 DIAGNOSIS — M5451 Vertebrogenic low back pain: Secondary | ICD-10-CM | POA: Diagnosis not present

## 2023-05-22 DIAGNOSIS — M5451 Vertebrogenic low back pain: Secondary | ICD-10-CM | POA: Diagnosis not present

## 2023-05-22 DIAGNOSIS — M545 Low back pain, unspecified: Secondary | ICD-10-CM | POA: Diagnosis not present

## 2023-05-25 DIAGNOSIS — M5451 Vertebrogenic low back pain: Secondary | ICD-10-CM | POA: Diagnosis not present

## 2023-05-25 DIAGNOSIS — M545 Low back pain, unspecified: Secondary | ICD-10-CM | POA: Diagnosis not present

## 2023-05-29 DIAGNOSIS — M9905 Segmental and somatic dysfunction of pelvic region: Secondary | ICD-10-CM | POA: Diagnosis not present

## 2023-05-29 DIAGNOSIS — M9902 Segmental and somatic dysfunction of thoracic region: Secondary | ICD-10-CM | POA: Diagnosis not present

## 2023-05-29 DIAGNOSIS — M5442 Lumbago with sciatica, left side: Secondary | ICD-10-CM | POA: Diagnosis not present

## 2023-05-29 DIAGNOSIS — M5451 Vertebrogenic low back pain: Secondary | ICD-10-CM | POA: Diagnosis not present

## 2023-05-29 DIAGNOSIS — M5441 Lumbago with sciatica, right side: Secondary | ICD-10-CM | POA: Diagnosis not present

## 2023-05-29 DIAGNOSIS — M9906 Segmental and somatic dysfunction of lower extremity: Secondary | ICD-10-CM | POA: Diagnosis not present

## 2023-05-29 DIAGNOSIS — M545 Low back pain, unspecified: Secondary | ICD-10-CM | POA: Diagnosis not present

## 2023-05-29 DIAGNOSIS — M9903 Segmental and somatic dysfunction of lumbar region: Secondary | ICD-10-CM | POA: Diagnosis not present

## 2023-05-31 DIAGNOSIS — M5451 Vertebrogenic low back pain: Secondary | ICD-10-CM | POA: Diagnosis not present

## 2023-05-31 DIAGNOSIS — M545 Low back pain, unspecified: Secondary | ICD-10-CM | POA: Diagnosis not present

## 2023-06-02 ENCOUNTER — Other Ambulatory Visit: Payer: Self-pay | Admitting: Cardiology

## 2023-06-02 ENCOUNTER — Other Ambulatory Visit: Payer: Self-pay | Admitting: Internal Medicine

## 2023-06-05 ENCOUNTER — Ambulatory Visit

## 2023-06-05 ENCOUNTER — Encounter

## 2023-06-05 ENCOUNTER — Other Ambulatory Visit

## 2023-06-07 DIAGNOSIS — M5451 Vertebrogenic low back pain: Secondary | ICD-10-CM | POA: Diagnosis not present

## 2023-06-07 DIAGNOSIS — M545 Low back pain, unspecified: Secondary | ICD-10-CM | POA: Diagnosis not present

## 2023-06-08 ENCOUNTER — Ambulatory Visit (HOSPITAL_COMMUNITY)
Admission: RE | Admit: 2023-06-08 | Discharge: 2023-06-08 | Disposition: A | Discharge status: Home / Self Care | Source: Ambulatory Visit | Attending: Surgery | Admitting: Surgery

## 2023-06-08 ENCOUNTER — Ambulatory Visit

## 2023-06-08 DIAGNOSIS — I712 Thoracic aortic aneurysm, without rupture, unspecified: Secondary | ICD-10-CM | POA: Diagnosis not present

## 2023-06-08 DIAGNOSIS — I7121 Aneurysm of the ascending aorta, without rupture: Secondary | ICD-10-CM | POA: Diagnosis not present

## 2023-06-08 DIAGNOSIS — R590 Localized enlarged lymph nodes: Secondary | ICD-10-CM | POA: Diagnosis not present

## 2023-06-08 DIAGNOSIS — J439 Emphysema, unspecified: Secondary | ICD-10-CM | POA: Diagnosis not present

## 2023-06-09 DIAGNOSIS — M5451 Vertebrogenic low back pain: Secondary | ICD-10-CM | POA: Diagnosis not present

## 2023-06-09 DIAGNOSIS — M545 Low back pain, unspecified: Secondary | ICD-10-CM | POA: Diagnosis not present

## 2023-06-12 ENCOUNTER — Ambulatory Visit: Attending: Cardiology | Admitting: *Deleted

## 2023-06-12 DIAGNOSIS — Z952 Presence of prosthetic heart valve: Secondary | ICD-10-CM | POA: Diagnosis not present

## 2023-06-12 DIAGNOSIS — Z5181 Encounter for therapeutic drug level monitoring: Secondary | ICD-10-CM | POA: Diagnosis not present

## 2023-06-12 DIAGNOSIS — M545 Low back pain, unspecified: Secondary | ICD-10-CM | POA: Diagnosis not present

## 2023-06-12 LAB — POCT INR: INR: 3.6 — AB (ref 2.0–3.0)

## 2023-06-12 NOTE — Patient Instructions (Signed)
 Hold warfarin tonight then resume 1 tablet daily except 1/2 tablet on Sundays, Tuesdays and Thursdays Recheck in 4 weeks

## 2023-06-20 ENCOUNTER — Encounter: Payer: Self-pay | Admitting: Physician Assistant

## 2023-06-20 ENCOUNTER — Ambulatory Visit: Attending: Thoracic Surgery (Cardiothoracic Vascular Surgery) | Admitting: Physician Assistant

## 2023-06-20 VITALS — BP 140/73 | HR 55 | Resp 20 | Ht 67.0 in | Wt 204.0 lb

## 2023-06-20 DIAGNOSIS — I7121 Aneurysm of the ascending aorta, without rupture: Secondary | ICD-10-CM

## 2023-06-20 NOTE — Patient Instructions (Signed)
 Risk Modification in those with ascending thoracic aortic aneurysm:  Continue good control of blood pressure (prefer SBP 130/80 or less)  2. Avoid fluoroquinolone antibiotics (I.e Ciprofloxacin , Avelox, Levofloxacin , Ofloxacin)  3.  Use of statin (you are taking atorvastatin )  4.  Exercise and activity limitations is individualized, but in general, contact sports are to be avoided and one should avoid heavy lifting of greater than 25lbs  5. Plan for non-contrast CT chest in 1 year in [place] and we will arrange for a virtual (telephone) after the scan to discuss the results.

## 2023-06-20 NOTE — Progress Notes (Signed)
 HPI: [name] is a 78 year old gentleman has history of hypertension, idiopathic pulmonary fibrosis, and bicuspid aortic valve disease. He is status post mechanical aortic valve replacement with Bentall procedure by Dr. Sherene Dilling in 2010.  He has been followed in our office annually for surveillance of aneurysmal dilation of the proximal aorta distal to the previous repair.   He is followed by the cardiology clinic in [facility] for management of his Coumadin . His last INR on 4/21 was 3.6.   Current Outpatient Medications  Medication Sig Dispense Refill   acetaminophen  (TYLENOL ) 500 MG tablet Take 1,000 mg by mouth every 6 (six) hours as needed for mild pain or moderate pain.     amLODipine  (NORVASC ) 5 MG tablet Take 5 mg by mouth daily in the afternoon.     atorvastatin  (LIPITOR) 40 MG tablet TAKE ONE (1) TABLET BY MOUTH EVERY DAY 90 tablet 1   esomeprazole  (NEXIUM ) 40 MG capsule TAKE ONE (1) CAPSULE (40 MG TOTAL) BY MOUTH TWO (2) (TWO) TIMES DAILY. 180 capsule 1   methocarbamol (ROBAXIN) 500 MG tablet Take 500 mg by mouth every 8 (eight) hours as needed for muscle spasms.     metoprolol  succinate (TOPROL  XL) 50 MG 24 hr tablet Take 1 tablet (50 mg total) by mouth at bedtime. Take with or immediately following a meal. 30 tablet 4   warfarin (COUMADIN ) 5 MG tablet TAKE ONE (1) TABLET DAILY EXCEPT ONE HALF (1/2) TABLET ON SUNDAYS, TUESDAYS AND THURSDAYS OR AS DIRECTED 90 tablet 3   No current facility-administered medications for this visit.    Physical Exam: Vital Signs: BP 140/73 HR 55 RR 20 SpO2 95% on RA  General: Pleasant 77yo male in no distress. No new concerns. Heart:  RRR, crisp click of the mechanical aortic valve. Chest:  Breath sounds are clear and he has normal work of breathing. Exts:  Trace bilateral ankle edema   Diagnostic Tests: CLINICAL DATA:  Ascending aortic aneurysm following repair.   EXAM: CT CHEST WITHOUT CONTRAST   TECHNIQUE: Multidetector CT imaging  of the chest was performed following the standard protocol without IV contrast.   RADIATION DOSE REDUCTION: This exam was performed according to the departmental dose-optimization program which includes automated exposure control, adjustment of the mA and/or kV according to patient size and/or use of iterative reconstruction technique.   COMPARISON:  Chest CTs without contrast 05/25/2022 and 05/19/2021   FINDINGS: Cardiovascular: Sternotomy sutures and metallic AVR are again noted with ascending aortic tube graft repair.   Just distal to the the graft, aneurysmal dilatation continues to be seen in the proximal arch measuring 5 cm transverse on 2:59, 4.3 cm in height on 6:106.   There are moderate calcific plaques and tortuosity in the remaining thoracic aorta, mild scattered plaque in the great vessels, without further appreciable aneurysms.   The cardiac size is normal. There are calcific plaques in the left main and LAD coronary artery.   No pericardial effusion. The pulmonary arteries and veins are normal caliber.   Mediastinum/Nodes: There are stable mildly enlarged precarinal and subcarinal lymph nodes, both 1.5 cm in short axis.   There are stable shotty subcentimeter short axis prevascular, AP window and right paratracheal nodes.   No new or progressive adenopathy is seen. The thyroid  gland, trachea and thoracic esophagus unremarkable.   Lungs/Pleura: The lungs moderately emphysematous with centrilobular and paraseptal changes predominating in the upper lobes, with unchanged thin walled bullous disease in the posterior lingula and posterior left lower  lobe.   There are subpleural reticulations, associated scattered subpleural bronchiolectasis, with a basal gradient and no interval change in appearance.   No focal pneumonia or nodule is evident.  No pleural effusion.   Upper Abdomen: No acute abnormality. Diffuse pancreatic fatty replacement. Old granulomatous  disease in the spleen. Abdominal aortic atherosclerosis.   Musculoskeletal: Mild kyphodextroscoliosis and degenerative change thoracic spine. No acute or significant osseous findings. No chest wall mass is seen.   IMPRESSION: 1. Stable appearance of the thoracic aorta with 5 cm transverse and 4.3 cm in height aneurysm in the proximal arch just distal to the graft. 2. Aortic and coronary artery atherosclerosis. 3. Emphysema with stable thin walled bullous disease in the posterior lingula and posterior left lower lobe. 4. Stable subpleural reticulations and bronchiolectasis with a basal gradient. 5. Stable mildly enlarged precarinal and subcarinal lymph nodes. No new or progressive adenopathy is seen.   Aortic Atherosclerosis (ICD10-I70.0) and Emphysema (ICD10-J43.9).     Electronically Signed   By: Denman Fischer M.D.   On: 06/20/2023 01:44    Impression / Plan: Stable aneurysmal dilation of the proximal aorta distal to the previous ascending aortic graft placed in 2010.  Annual surveillance is again recommended.  We also reviewed the importance of careful blood pressure control and avoidance of strenuous activities. Plan for non-contrast chest CT in 1 year.   [name] requested having the CT done in [place] next year and also requested a virtual visit to avoid driving in [place]. We will arrange for this.     Kindel Rochefort G. Shaunte Weissinger, PA-C [facility] [phone]

## 2023-07-06 ENCOUNTER — Telehealth: Payer: Self-pay | Admitting: *Deleted

## 2023-07-06 ENCOUNTER — Telehealth: Payer: Self-pay

## 2023-07-06 NOTE — Telephone Encounter (Signed)
 Tried calling patient to schedule televisit no answer left a detailed message to call back

## 2023-07-06 NOTE — Telephone Encounter (Signed)
   Name: Stephen Moses  DOB: 06-09-1945  MRN: [id number]  Primary Cardiologist: Teddie Favre, MD   Preoperative team, please contact this patient and set up a phone call appointment for further preoperative risk assessment. Please obtain consent and complete medication review. Thank you for your help.  I confirm that guidance regarding antiplatelet and oral anticoagulation therapy has been completed and, if necessary, noted below.  Patient with diagnosis of mechanical aortic valve on warfarin for anticoagulation.     Procedure: EPIDURAL STEROID INJECTION, LUMBAR-  Date of procedure: 07/18/23   Patient has hx of post op afib. He has has atrial tachycardia which does put him at increased risk of afib.    Per Dr. Londa Rival patient will need lovenox  bridge.   CrCl 31 ml/min Platelet count 209   Per office protocol, patient can hold warfarin for 5 days prior to procedure.     Patient WILL need bridging with Lovenox  (enoxaparin ) around procedure.   I also confirmed the patient resides in the state of [place] . As per [facility] Medical Board telemedicine laws, the patient must reside in the state in which the provider is licensed.   Ava Boatman, NP 07/06/2023, 1:17 PM  [facility]

## 2023-07-06 NOTE — Telephone Encounter (Signed)
 Patient called back and has been scheduled for his televisit

## 2023-07-06 NOTE — Telephone Encounter (Signed)
 Patient has been scheduled for televisit and consent done     Patient Consent for Virtual Visit         ZESHAWN Moses has provided verbal consent on 07/06/2023 for a virtual visit (video or telephone).   CONSENT FOR VIRTUAL VISIT FOR:  Stephen Moses  By participating in this virtual visit I agree to the following:  I hereby voluntarily request, consent and authorize [facility] and its employed or contracted physicians, physician assistants, nurse practitioners or other licensed health care professionals (the Practitioner), to provide me with telemedicine health care services (the "Services") as deemed necessary by the treating Practitioner. I acknowledge and consent to receive the Services by the Practitioner via telemedicine. I understand that the telemedicine visit will involve communicating with the Practitioner through live audiovisual communication technology and the disclosure of certain medical information by electronic transmission. I acknowledge that I have been given the opportunity to request an in-person assessment or other available alternative prior to the telemedicine visit and am voluntarily participating in the telemedicine visit.  I understand that I have the right to withhold or withdraw my consent to the use of telemedicine in the course of my care at any time, without affecting my right to future care or treatment, and that the Practitioner or I may terminate the telemedicine visit at any time. I understand that I have the right to inspect all information obtained and/or recorded in the course of the telemedicine visit and may receive copies of available information for a reasonable fee.  I understand that some of the potential risks of receiving the Services via telemedicine include:  Delay or interruption in medical evaluation due to technological equipment failure or disruption; Information transmitted may not be sufficient (e.g. poor resolution of images) to  allow for appropriate medical decision making by the Practitioner; and/or  In rare instances, security protocols could fail, causing a breach of personal health information.  Furthermore, I acknowledge that it is my responsibility to provide information about my medical history, conditions and care that is complete and accurate to the best of my ability. I acknowledge that Practitioner's advice, recommendations, and/or decision may be based on factors not within their control, such as incomplete or inaccurate data provided by me or distortions of diagnostic images or specimens that may result from electronic transmissions. I understand that the practice of medicine is not an exact science and that Practitioner makes no warranties or guarantees regarding treatment outcomes. I acknowledge that a copy of this consent can be made available to me via my patient portal [facility] MyChart), or I can request a printed copy by calling the office of [facility].    I understand that my insurance will be billed for this visit.   I have read or had this consent read to me. I understand the contents of this consent, which adequately explains the benefits and risks of the Services being provided via telemedicine.  I have been provided ample opportunity to ask questions regarding this consent and the Services and have had my questions answered to my satisfaction. I give my informed consent for the services to be provided through the use of telemedicine in my medical care

## 2023-07-06 NOTE — Telephone Encounter (Signed)
   Pre-operative Risk Assessment    Patient Name: Stephen Moses  DOB: 1946-01-06 MRN: [id number]   Date of last office visit: 03/14/23 DR. MCDOWELL Date of next office visit: 10/10/23 DR. MCDOWELL   Request for Surgical Clearance    Procedure:  EPIDURAL STEROID INJECTION, LUMBAR-  Date of Surgery:  Clearance 07/18/23                                Surgeon:  DR. Rexanne Catalina Surgeon's Group or Practice Name:  [facility] Phone number:  [phone] EXT [phone] MELISSA BENNETT Fax number:  [phone]   Type of Clearance Requested:   - Medical  - Pharmacy:  Hold Warfarin (Coumadin ) x 5 DAYS PRIOR    Type of Anesthesia:  Not Indicated   Additional requests/questions:    Signed, Nikia Mangino   07/06/2023, 12:12 PM

## 2023-07-06 NOTE — Telephone Encounter (Signed)
 Patient with diagnosis of mechanical aortic valve on warfarin for anticoagulation.    Procedure: EPIDURAL STEROID INJECTION, LUMBAR-  Date of procedure: 07/18/23  Patient has hx of post op afib. He has has atrial tachycardia which does put him at increased risk of afib.   Per Dr. Londa Rival patient will need lovenox  bridge.  CrCl 31 ml/min Platelet count 209  Per office protocol, patient can hold warfarin for 5 days prior to procedure.    Patient WILL need bridging with Lovenox  (enoxaparin ) around procedure.  **This guidance is not considered finalized until pre-operative APP has relayed final recommendations.**

## 2023-07-10 ENCOUNTER — Ambulatory Visit (INDEPENDENT_AMBULATORY_CARE_PROVIDER_SITE_OTHER): Admitting: Emergency Medicine

## 2023-07-10 ENCOUNTER — Ambulatory Visit: Attending: Cardiology | Admitting: *Deleted

## 2023-07-10 DIAGNOSIS — Z5181 Encounter for therapeutic drug level monitoring: Secondary | ICD-10-CM

## 2023-07-10 DIAGNOSIS — Z952 Presence of prosthetic heart valve: Secondary | ICD-10-CM | POA: Diagnosis not present

## 2023-07-10 DIAGNOSIS — Z01810 Encounter for preprocedural cardiovascular examination: Secondary | ICD-10-CM

## 2023-07-10 LAB — POCT INR: INR: 3.6 — AB (ref 2.0–3.0)

## 2023-07-10 MED ORDER — ENOXAPARIN SODIUM 100 MG/ML IJ SOSY: 100.0000 mg | PREFILLED_SYRINGE | Freq: Two times a day (BID) | INTRAMUSCULAR | 0 refills | Status: DC

## 2023-07-10 NOTE — Patient Instructions (Signed)
 Pending Epidural steroid injection on 5/27. Will hold warfarin and bridge with Lovenox    Labs:  05/11/23  Scr 1.89  CrCl 43.50  12/26/22  Hgb 14.2  Hct 42.6  Plts 224  Wt 92.5kg  Lovenox  100mg  twice daily at 7am and 7pm  #20 [facility] to [facility]  5/21  Last dose of warfarin 5/22  No Lovenox  or warfarin 5/23 - 5/25  Lovenox  100mg  sq at 7am and 7pm 5/26  Lovenox  100mg  at 7am-----NO lovenox  in pm 5/27  NO lovenox  in am ------ procedure-----warfarin 5mg  pm 5/28  Lovenox  100mg  at 7am and 7pm and warfarin 7.5mg  pm 5/29   Lovenox  100mg  at 7am and 7pm and warfarin 2.5mg  pm 5/30 - 5/31   Lovenox  100mg  at 7am and 7pm and warfarin 5mg  pm 6/1   Lovenox  100mg  at 7am and 7pm and warfarin 2.5mg  pm 6/2   Lovenox  100mg  at 7am and INR appt at 8am

## 2023-07-10 NOTE — Progress Notes (Signed)
 Virtual Visit via Telephone Note   Because of DERRIAN POLI co-morbid illnesses, he is at least at moderate risk for complications without adequate follow up.  This format is felt to be most appropriate for this patient at this time.  Due to technical limitations with video connection Web designer), today's appointment will be conducted as an audio only telehealth visit, and Stephen Moses verbally agreed to proceed in this manner.   All issues noted in this document were discussed and addressed.  No physical exam could be performed with this format.  Evaluation Performed:  Preoperative cardiovascular risk assessment _____________   Date:  07/10/2023   Patient ID:  Stephen Moses, DOB 06-Jan-1946, MRN [id number] Patient Location:  Home Provider location:   Office  Primary Care Provider:  Artemisa Bile, MD Primary Cardiologist:  Teddie Favre, MD  Chief Complaint / Patient Profile   78 y.o. y/o male with a h/o history of bicuspid aortic valve with aortic stenosis and ascending aortic aneurysm status post 23 mm Saint Jude AVR with ascending conduit and reimplantation of coronaries, hypertension, PSVT, CKD stage IIIb, IPF who is pending epidural steroid injection, lumbar on 07/18/2023 with [facility] by Dr. Rexanne Catalina and presents today for telephonic preoperative cardiovascular risk assessment.  History of Present Illness    Stephen Moses is a 78 y.o. male who presents via audio/video conferencing for a telehealth visit today.  Pt was last seen in cardiology clinic on 03/14/2023 by Dr. Londa Rival.  At that time Stephen Moses was doing well.  The patient is now pending procedure as outlined above. Since his last visit, he denies chest pain, shortness of breath, lower extremity edema, fatigue, palpitations, melena, hematuria, hemoptysis, diaphoresis, weakness, presyncope, syncope, orthopnea, and PND.  Today patient is doing well overall.  He is without any acute cardiovascular concerns or  complaints.  He is without any chest pain or anginal symptoms.  Specifically he denies any exertional angina.  He does have history of IPF, notes chronic dyspnea is unchanged.  He has had no dizziness or syncope.  No palpitations or tachycardia, PSVT seems to be well-controlled.  No leg swelling or orthopnea.  He continues to stay active.  He continues to mow his yard and weeding his yard.  He continues to do housework.  He notes that his activity level is significantly affected by his low back pain.  He is able to complete greater than 4 METS.  Past Medical History    Past Medical History:  Diagnosis Date   Allergy    Ascending aortic aneurysm (HCC) 2010    23-mm St. Jude mechanical valve conduit and re-implantation of the coronary arteries   Bicuspid aortic valve    Calculus of kidney    Essential hypertension    Long term current use of anticoagulant    Normocytic anemia    Pancytopenia (HCC)    Paraspinal muscle spasm    Past Surgical History:  Procedure Laterality Date   BACK SURGERY  2009   CARDIAC VALVE REPLACEMENT  2010   COLONOSCOPY     COLONOSCOPY N/A 10/16/2014   Dr. Homero Luster: two colon polyps (tubular adenomas), scattered diverticula at sigmoid, small hemorrhoids above and below dentate line, surveillance due in 2021.    COLONOSCOPY N/A 03/12/2020   Procedure: COLONOSCOPY;  Surgeon: Ruby Corporal, MD;  Location: [facility];  Service: Endoscopy;  Laterality: N/A;  8:00   EYE SURGERY     Left rotator cuff repair  POLYPECTOMY  03/12/2020   Procedure: POLYPECTOMY;  Surgeon: Ruby Corporal, MD;  Location: [facility];  Service: Endoscopy;;   SHOULDER SURGERY Bilateral     Allergies  Allergies  Allergen Reactions   Codeine Nausea And Vomiting and Palpitations   Famotidine  Other (See Comments)    constipation    Home Medications    Prior to Admission medications   Medication Sig Start Date End Date Taking? Authorizing Provider  acetaminophen  (TYLENOL ) 500 MG  tablet Take 1,000 mg by mouth every 6 (six) hours as needed for mild pain or moderate pain.    [provider]  amLODipine  (NORVASC ) 5 MG tablet Take 5 mg by mouth daily in the afternoon.    [provider]  atorvastatin  (LIPITOR) 40 MG tablet TAKE ONE (1) TABLET BY MOUTH EVERY DAY 06/02/23   Gerard Knight, MD  enoxaparin  (LOVENOX ) 100 MG/ML injection Inject 1 mL (100 mg total) into the skin every 12 (twelve) hours. 07/10/23   Gerard Knight, MD  esomeprazole  (NEXIUM ) 40 MG capsule TAKE ONE (1) CAPSULE (40 MG TOTAL) BY MOUTH TWO (2) (TWO) TIMES DAILY. 06/02/23   Wert, Michael B, MD  methocarbamol (ROBAXIN) 500 MG tablet Take 500 mg by mouth every 8 (eight) hours as needed for muscle spasms.    [provider]  metoprolol  succinate (TOPROL  XL) 50 MG 24 hr tablet Take 1 tablet (50 mg total) by mouth at bedtime. Take with or immediately following a meal. 01/18/23   Gerard Knight, MD  warfarin (COUMADIN ) 5 MG tablet TAKE ONE (1) TABLET DAILY EXCEPT ONE HALF (1/2) TABLET ON SUNDAYS, TUESDAYS AND THURSDAYS OR AS DIRECTED 03/06/23   Gerard Knight, MD    Physical Exam    Vital Signs:  Stephen Moses does not have vital signs available for review today.  Given telephonic nature of communication, physical exam is limited. AAOx3. NAD. Normal affect.  Speech and respirations are unlabored.  Accessory Clinical Findings    None  Assessment & Plan    1.  Preoperative Cardiovascular Risk Assessment: According to the Revised Cardiac Risk Index (RCRI), his Perioperative Risk of Major Cardiac Event is (%): 0.9. His Functional Capacity in METs is: 5.62 according to the Duke Activity Status Index (DASI). Therefore, based on ACC/AHA guidelines, patient would be at acceptable risk for the planned procedure without further cardiovascular testing.  The patient was advised that if he develops new symptoms prior to surgery to contact our office to arrange for a follow-up  visit, and he verbalized understanding.   Per office protocol, patient can hold warfarin for 5 days prior to procedure.     Patient WILL need bridging with Lovenox  (enoxaparin ) around procedure.   A copy of this note will be routed to requesting surgeon.  Time:   Today, I have spent 8 minutes with the patient with telehealth technology discussing medical history, symptoms, and management plan.     Ava Boatman, NP  07/10/2023, 2:35 PM

## 2023-07-18 DIAGNOSIS — M5416 Radiculopathy, lumbar region: Secondary | ICD-10-CM | POA: Diagnosis not present

## 2023-07-24 ENCOUNTER — Ambulatory Visit: Attending: Cardiology | Admitting: *Deleted

## 2023-07-24 DIAGNOSIS — Z5181 Encounter for therapeutic drug level monitoring: Secondary | ICD-10-CM | POA: Diagnosis not present

## 2023-07-24 DIAGNOSIS — Z952 Presence of prosthetic heart valve: Secondary | ICD-10-CM | POA: Diagnosis not present

## 2023-07-24 LAB — POCT INR: INR: 2.5 (ref 2.0–3.0)

## 2023-07-24 NOTE — Patient Instructions (Signed)
Continue warfarin 1 tablet daily except 1/2 tablet on Sundays, Tuesdays and Thursdays Recheck in 4 wks 

## 2023-08-01 DIAGNOSIS — M9902 Segmental and somatic dysfunction of thoracic region: Secondary | ICD-10-CM | POA: Diagnosis not present

## 2023-08-01 DIAGNOSIS — M9903 Segmental and somatic dysfunction of lumbar region: Secondary | ICD-10-CM | POA: Diagnosis not present

## 2023-08-01 DIAGNOSIS — M9905 Segmental and somatic dysfunction of pelvic region: Secondary | ICD-10-CM | POA: Diagnosis not present

## 2023-08-01 DIAGNOSIS — M9906 Segmental and somatic dysfunction of lower extremity: Secondary | ICD-10-CM | POA: Diagnosis not present

## 2023-08-01 DIAGNOSIS — M5441 Lumbago with sciatica, right side: Secondary | ICD-10-CM | POA: Diagnosis not present

## 2023-08-01 DIAGNOSIS — M5442 Lumbago with sciatica, left side: Secondary | ICD-10-CM | POA: Diagnosis not present

## 2023-08-22 ENCOUNTER — Ambulatory Visit: Attending: Cardiology | Admitting: *Deleted

## 2023-08-22 DIAGNOSIS — Z5181 Encounter for therapeutic drug level monitoring: Secondary | ICD-10-CM

## 2023-08-22 DIAGNOSIS — Z952 Presence of prosthetic heart valve: Secondary | ICD-10-CM | POA: Diagnosis not present

## 2023-08-22 LAB — POCT INR: INR: 2.7 (ref 2.0–3.0)

## 2023-08-22 NOTE — Patient Instructions (Signed)
Continue warfarin 1 tablet daily except 1/2 tablet on Sundays, Tuesdays and Thursdays Recheck in 5 wks

## 2023-08-22 NOTE — Progress Notes (Signed)
Please see anticoagulation encounter.

## 2023-08-29 ENCOUNTER — Other Ambulatory Visit: Payer: Self-pay | Admitting: Internal Medicine

## 2023-09-19 DIAGNOSIS — M5442 Lumbago with sciatica, left side: Secondary | ICD-10-CM | POA: Diagnosis not present

## 2023-09-19 DIAGNOSIS — M9906 Segmental and somatic dysfunction of lower extremity: Secondary | ICD-10-CM | POA: Diagnosis not present

## 2023-09-19 DIAGNOSIS — M5441 Lumbago with sciatica, right side: Secondary | ICD-10-CM | POA: Diagnosis not present

## 2023-09-19 DIAGNOSIS — M9902 Segmental and somatic dysfunction of thoracic region: Secondary | ICD-10-CM | POA: Diagnosis not present

## 2023-09-19 DIAGNOSIS — M9903 Segmental and somatic dysfunction of lumbar region: Secondary | ICD-10-CM | POA: Diagnosis not present

## 2023-09-19 DIAGNOSIS — N1832 Chronic kidney disease, stage 3b: Secondary | ICD-10-CM | POA: Diagnosis not present

## 2023-09-19 DIAGNOSIS — Z79899 Other long term (current) drug therapy: Secondary | ICD-10-CM | POA: Diagnosis not present

## 2023-09-19 DIAGNOSIS — I10 Essential (primary) hypertension: Secondary | ICD-10-CM | POA: Diagnosis not present

## 2023-09-19 DIAGNOSIS — M9905 Segmental and somatic dysfunction of pelvic region: Secondary | ICD-10-CM | POA: Diagnosis not present

## 2023-09-26 ENCOUNTER — Ambulatory Visit: Attending: Cardiology | Admitting: *Deleted

## 2023-09-26 DIAGNOSIS — Z5181 Encounter for therapeutic drug level monitoring: Secondary | ICD-10-CM

## 2023-09-26 DIAGNOSIS — Z952 Presence of prosthetic heart valve: Secondary | ICD-10-CM

## 2023-09-26 LAB — POCT INR: INR: 3.6 — AB (ref 2.0–3.0)

## 2023-09-26 NOTE — Progress Notes (Signed)
 INR 3.6  Please see anticoagulation encounter

## 2023-09-26 NOTE — Patient Instructions (Signed)
 Hold warfarin today then resume 1 tablet daily except 1/2 tablet on Sundays, Tuesdays and Thursdays Eat extra greens today Recheck in 4 wks

## 2023-10-04 DIAGNOSIS — J8410 Pulmonary fibrosis, unspecified: Secondary | ICD-10-CM | POA: Diagnosis not present

## 2023-10-04 DIAGNOSIS — M79606 Pain in leg, unspecified: Secondary | ICD-10-CM | POA: Diagnosis not present

## 2023-10-04 DIAGNOSIS — N1832 Chronic kidney disease, stage 3b: Secondary | ICD-10-CM | POA: Diagnosis not present

## 2023-10-04 DIAGNOSIS — I10 Essential (primary) hypertension: Secondary | ICD-10-CM | POA: Diagnosis not present

## 2023-10-10 ENCOUNTER — Ambulatory Visit: Admitting: Cardiology

## 2023-10-13 ENCOUNTER — Encounter: Payer: Self-pay | Admitting: Cardiology

## 2023-10-13 ENCOUNTER — Ambulatory Visit: Attending: Cardiology | Admitting: Cardiology

## 2023-10-13 VITALS — BP 130/72 | HR 60 | Ht 67.5 in | Wt 200.2 lb

## 2023-10-13 DIAGNOSIS — I10 Essential (primary) hypertension: Secondary | ICD-10-CM

## 2023-10-13 DIAGNOSIS — Z952 Presence of prosthetic heart valve: Secondary | ICD-10-CM | POA: Diagnosis not present

## 2023-10-13 DIAGNOSIS — I4719 Other supraventricular tachycardia: Secondary | ICD-10-CM | POA: Diagnosis not present

## 2023-10-13 DIAGNOSIS — I471 Supraventricular tachycardia, unspecified: Secondary | ICD-10-CM

## 2023-10-13 NOTE — Progress Notes (Signed)
    Cardiology Office Note  Date: 10/13/2023   ID: Stephen Moses, Reason [birth year], MRN [id number]  History of Present Illness: Stephen Moses is a 78 y.o. male last seen in January.  He is here for a follow-up visit.  He reports relatively stable palpitations as before, these tend to be when he is still in the evenings.  Stable NYHA class II dyspnea, no sudden dizziness or syncope.  We reviewed his medications.  He continues on Coumadin  with follow-up in the anticoagulation clinic.  Cardiac regimen is otherwise stable.  He does not report any spontaneous bleeding problems.  I reviewed his ECG today which shows sinus rhythm with prolonged PR interval and nonspecific ST-T changes.  I reviewed his interval lab work which is noted below.  He continues to follow with Stephen Moses.  Physical Exam: VS:  BP 130/72 (BP Location: Left Arm, Cuff Size: Normal)   Pulse 60   Ht 5' 7.5 (1.715 m)   Wt 200 lb 3.2 oz (90.8 kg)   SpO2 99%   BMI 30.89 kg/m , BMI Body mass index is 30.89 kg/m.  Wt Readings from Last 3 Encounters:  10/13/23 200 lb 3.2 oz (90.8 kg)  06/20/23 204 lb (92.5 kg)  03/14/23 204 lb 9.6 oz (92.8 kg)    General: Patient appears comfortable at rest. HEENT: Conjunctiva and lids normal. Neck: Supple, no elevated JVP or carotid bruits. Lungs: Clear to auscultation, nonlabored breathing at rest. Cardiac: Regular rate and rhythm, no S3, crisp prosthetic click in S2 with 2/6 systolic murmur. Extremities: No pitting edema.  ECG:  An ECG dated 01/25/2023 was personally reviewed today and demonstrated:  Sinus bradycardia with PVCs.  Labwork: 12/13/2022: BUN 20; Creatinine, Ser 1.80; Hemoglobin 14.1; Platelets 209; Potassium 4.1; Sodium 135; TSH 3.496  July 2025: BUN 33, creatinine 1.91, GFR 36, potassium 4.4  Other Studies Reviewed Today:  No interval cardiac testing for review today.  Assessment and Plan:  1.  History of bicuspid aortic valve with aortic stenosis and  ascending aortic aneurysm status post 23 mm St. Jude AVR with ascending conduit and reimplantation of coronaries (normal at cardiac catheterization) in 2010.  Follow-up chest CTA in April showed overall stable caliber native aorta and the proximal arch measuring 5 cm transverse and 4.3 cm height (he continues to follow with Stephen Moses annually).  Follow-up echocardiogram in January 2024 showed stable AVR function with mean gradient 13 mmHg and no significant aortic regurgitation.  Plan to continue Coumadin  with follow-up in the anticoagulation clinic.   2.  Primary hypertension.  Blood pressure control adequate today.  He also tracks measurements at home.  Continue Norvasc  5 mg daily and Toprol -XL 50 mg daily.   3.  PSVT (atrial tachycardia).  He has been seen by Stephen Moses with plan to continue beta-blocker therapy, tolerating up titration to 50 mg daily.   4.  CKD stage IIIb, recent creatinine 1.91 with GFR 36.  Disposition:  Follow up 6 months.  Signed, Stephen Moses, M.D., F.A.C.C. [facility]

## 2023-10-13 NOTE — Patient Instructions (Signed)
 Medication Instructions:  Your physician recommends that you continue on your current medications as directed. Please refer to the Current Medication list given to you today.   Labwork: None today  Testing/Procedures: None today  Follow-Up: 6 months  Any Other Special Instructions Will Be Listed Below (If Applicable).  If you need a refill on your cardiac medications before your next appointment, please call your pharmacy.

## 2023-10-16 DIAGNOSIS — M9902 Segmental and somatic dysfunction of thoracic region: Secondary | ICD-10-CM | POA: Diagnosis not present

## 2023-10-16 DIAGNOSIS — M9905 Segmental and somatic dysfunction of pelvic region: Secondary | ICD-10-CM | POA: Diagnosis not present

## 2023-10-16 DIAGNOSIS — M5441 Lumbago with sciatica, right side: Secondary | ICD-10-CM | POA: Diagnosis not present

## 2023-10-16 DIAGNOSIS — M9903 Segmental and somatic dysfunction of lumbar region: Secondary | ICD-10-CM | POA: Diagnosis not present

## 2023-10-16 DIAGNOSIS — M5442 Lumbago with sciatica, left side: Secondary | ICD-10-CM | POA: Diagnosis not present

## 2023-10-16 DIAGNOSIS — M9906 Segmental and somatic dysfunction of lower extremity: Secondary | ICD-10-CM | POA: Diagnosis not present

## 2023-10-17 ENCOUNTER — Ambulatory Visit: Attending: Cardiology | Admitting: *Deleted

## 2023-10-17 DIAGNOSIS — Z952 Presence of prosthetic heart valve: Secondary | ICD-10-CM

## 2023-10-17 DIAGNOSIS — Z5181 Encounter for therapeutic drug level monitoring: Secondary | ICD-10-CM

## 2023-10-17 DIAGNOSIS — L570 Actinic keratosis: Secondary | ICD-10-CM | POA: Diagnosis not present

## 2023-10-17 LAB — POCT INR: INR: 3.4 — AB (ref 2.0–3.0)

## 2023-10-17 NOTE — Progress Notes (Signed)
 INR 3.4  Please see anticoagulation encounter

## 2023-10-17 NOTE — Patient Instructions (Signed)
 Hold warfarin today then decrease dose to 1/2 tablet daily except 1 tablet on Mondays, Wednesdays and Fridays Continue greens Recheck in 4 wks

## 2023-10-18 ENCOUNTER — Encounter

## 2023-10-24 ENCOUNTER — Encounter

## 2023-10-26 DIAGNOSIS — M5126 Other intervertebral disc displacement, lumbar region: Secondary | ICD-10-CM | POA: Diagnosis not present

## 2023-10-31 ENCOUNTER — Ambulatory Visit (INDEPENDENT_AMBULATORY_CARE_PROVIDER_SITE_OTHER): Admitting: Orthopedic Surgery

## 2023-10-31 ENCOUNTER — Other Ambulatory Visit (INDEPENDENT_AMBULATORY_CARE_PROVIDER_SITE_OTHER): Payer: Self-pay

## 2023-10-31 ENCOUNTER — Encounter: Payer: Self-pay | Admitting: Orthopedic Surgery

## 2023-10-31 VITALS — BP 147/75 | HR 64 | Ht 67.5 in | Wt 200.0 lb

## 2023-10-31 DIAGNOSIS — M25512 Pain in left shoulder: Secondary | ICD-10-CM | POA: Diagnosis not present

## 2023-10-31 DIAGNOSIS — G8929 Other chronic pain: Secondary | ICD-10-CM | POA: Diagnosis not present

## 2023-10-31 DIAGNOSIS — M75120 Complete rotator cuff tear or rupture of unspecified shoulder, not specified as traumatic: Secondary | ICD-10-CM | POA: Insufficient documentation

## 2023-10-31 DIAGNOSIS — M25511 Pain in right shoulder: Secondary | ICD-10-CM

## 2023-10-31 DIAGNOSIS — M67919 Unspecified disorder of synovium and tendon, unspecified shoulder: Secondary | ICD-10-CM | POA: Insufficient documentation

## 2023-10-31 DIAGNOSIS — M25519 Pain in unspecified shoulder: Secondary | ICD-10-CM | POA: Insufficient documentation

## 2023-10-31 NOTE — Progress Notes (Signed)
 Return patient Visit  Assessment: Stephen Moses is a 78 y.o. male with the following: 1.  Chronic pain of left shoulder 2.  Rotator cuff arthropathy right shoulder  Plan: Stephen Moses has pain in bilateral shoulders.  He states the pain in both shoulders woke him up last night.  Previous injection to left shoulder was helpful.  He does have a history of right shoulder rotator cuff repair, but based on radiographs obtained today, he has sustained a new injury.  He has rotator cuff arthropathy on the right.  Injection can help with pain, without guarantees for improved motion.  If he ever considered surgery, it would be a reverse shoulder arthroplasty.  He does have an extensive medical history, and may not be a candidate for surgery at [facility].  Procedure note injection Left shoulder    Verbal consent was obtained to inject the left shoulder, subacromial space Timeout was completed to confirm the site of injection.  The skin was prepped with alcohol and ethyl chloride was sprayed at the injection site.  A 21-gauge needle was used to inject 40 mg of Depo-Medrol  and 1% lidocaine  (3 cc) into the subacromial space of the left shoulder using a posterolateral approach.  There were no complications. A sterile bandage was applied.   Procedure note injection - Right shoulder    Verbal consent was obtained to inject the right shoulder, subacromial space Timeout was completed to confirm the site of injection.   The skin was prepped with alcohol and ethyl chloride was sprayed at the injection site.  A 21-gauge needle was used to inject 40 mg of Depo-Medrol  and 1% lidocaine  (4 cc) into the subacromial space of the right shoulder using a posterolateral approach.  There were no complications.  A sterile bandage was applied.     Follow-up: Return if symptoms worsen or fail to improve.  Subjective:  Chief Complaint  Patient presents with   Shoulder Pain    Bilat R for 2 wks from cutting  grass and L 1.5 wks no injury     History of Present Illness: Stephen Moses is a 78 y.o. male who returns to clinic for repeat evaluation of shoulder pain.  I saw him in clinic recently for his left shoulder.  Injection was helpful.  He started to have pain again.  He states he has a history of rotator cuff repair in the left shoulder.  He has also recently started to have a lot of pain in the right shoulder.  He has difficulty with motion.  He is doing rehab exercises on his own.  He takes Tylenol  for pain as needed.  Review of Systems: No fevers or chills No numbness or tingling No chest pain No shortness of breath No bowel or bladder dysfunction No GI distress No headaches      Objective: BP (!) 147/75   Pulse 64   Ht 5' 7.5 (1.715 m)   Wt 200 lb (90.7 kg)   BMI 30.86 kg/m   Physical Exam:  General: Elderly male., Alert and oriented., and No acute distress. Gait: Normal gait.  Left shoulder without deformity.  Well-healed surgical incisions.  Near full range of motion.  He is restricted in internal rotation.  External rotation is similar to the contralateral side.  Mild weakness is appreciated.  Negative belly press.  Positive O'Brien's.  Right shoulder with less than 90 degrees of abduction and forward flexion.  Passively, he can get his arm to 160 degrees  of forward flexion.  He is able to maintain the position of his arm once it is elevated.  Fingers warm and well-perfused.  Sensation intact throughout the right upper extremity.  Well-healed surgical incisions.  IMAGING: I personally ordered and reviewed the following images   X-rays left shoulder were obtained in clinic today.  These are compared to prior x-rays.  There are no acute injuries.  Minimal degenerative changes.  No evidence of proximal humeral migration.  Well-positioned humeral head within the glenoid.  No bony lesions.  No evidence of prior surgery.  Impression: Negative left shoulder  x-ray   X-rays of the right shoulder were obtained in clinic today.  Within the humeral head, there is a metallic implant.  There is evidence of proximal humeral migration.  Very little space between the humeral head and the acromion.  No bony lesions.  No acute injuries.  No fractures.  Impression: Right shoulder x-ray with sequelae of prior surgery with proximal humeral migration, consistent with chronic rotator cuff injury  New Medications:  No orders of the defined types were placed in this encounter.     Stephen DELENA Horde, MD  10/31/2023 10:05 AM

## 2023-10-31 NOTE — Patient Instructions (Signed)

## 2023-11-14 ENCOUNTER — Ambulatory Visit: Attending: Cardiology | Admitting: *Deleted

## 2023-11-14 DIAGNOSIS — Z952 Presence of prosthetic heart valve: Secondary | ICD-10-CM

## 2023-11-14 DIAGNOSIS — Z5181 Encounter for therapeutic drug level monitoring: Secondary | ICD-10-CM | POA: Diagnosis not present

## 2023-11-14 LAB — POCT INR: INR: 2.4 (ref 2.0–3.0)

## 2023-11-14 NOTE — Patient Instructions (Signed)
Continue warfarin 1/2 tablet daily except 1 tablet on Mondays, Wednesdays and Fridays Continue greens Recheck in 4 wks

## 2023-11-14 NOTE — Progress Notes (Signed)
 INR 2.4 Please see anticoagulation encounter

## 2023-11-23 DIAGNOSIS — M9902 Segmental and somatic dysfunction of thoracic region: Secondary | ICD-10-CM | POA: Diagnosis not present

## 2023-11-23 DIAGNOSIS — M9906 Segmental and somatic dysfunction of lower extremity: Secondary | ICD-10-CM | POA: Diagnosis not present

## 2023-11-23 DIAGNOSIS — M9905 Segmental and somatic dysfunction of pelvic region: Secondary | ICD-10-CM | POA: Diagnosis not present

## 2023-11-23 DIAGNOSIS — M5441 Lumbago with sciatica, right side: Secondary | ICD-10-CM | POA: Diagnosis not present

## 2023-11-23 DIAGNOSIS — M9903 Segmental and somatic dysfunction of lumbar region: Secondary | ICD-10-CM | POA: Diagnosis not present

## 2023-11-23 DIAGNOSIS — M5442 Lumbago with sciatica, left side: Secondary | ICD-10-CM | POA: Diagnosis not present

## 2023-11-27 ENCOUNTER — Other Ambulatory Visit: Payer: Self-pay | Admitting: Cardiology

## 2023-12-12 ENCOUNTER — Ambulatory Visit: Attending: Cardiology | Admitting: *Deleted

## 2023-12-12 DIAGNOSIS — Z952 Presence of prosthetic heart valve: Secondary | ICD-10-CM

## 2023-12-12 DIAGNOSIS — Z5181 Encounter for therapeutic drug level monitoring: Secondary | ICD-10-CM | POA: Diagnosis not present

## 2023-12-12 DIAGNOSIS — Z23 Encounter for immunization: Secondary | ICD-10-CM | POA: Diagnosis not present

## 2023-12-12 LAB — POCT INR: INR: 2.4 (ref 2.0–3.0)

## 2023-12-12 NOTE — Progress Notes (Signed)
 INR 2.4 Please see anticoagulation encounter

## 2023-12-12 NOTE — Patient Instructions (Signed)
Continue warfarin 1/2 tablet daily except 1 tablet on Mondays, Wednesdays and Fridays Continue greens Recheck in 5 wks

## 2023-12-14 DIAGNOSIS — M1990 Unspecified osteoarthritis, unspecified site: Secondary | ICD-10-CM | POA: Diagnosis not present

## 2023-12-14 DIAGNOSIS — I719 Aortic aneurysm of unspecified site, without rupture: Secondary | ICD-10-CM | POA: Diagnosis not present

## 2023-12-14 DIAGNOSIS — I130 Hypertensive heart and chronic kidney disease with heart failure and stage 1 through stage 4 chronic kidney disease, or unspecified chronic kidney disease: Secondary | ICD-10-CM | POA: Diagnosis not present

## 2023-12-14 DIAGNOSIS — I700 Atherosclerosis of aorta: Secondary | ICD-10-CM | POA: Diagnosis not present

## 2023-12-14 DIAGNOSIS — K219 Gastro-esophageal reflux disease without esophagitis: Secondary | ICD-10-CM | POA: Diagnosis not present

## 2023-12-14 DIAGNOSIS — N1832 Chronic kidney disease, stage 3b: Secondary | ICD-10-CM | POA: Diagnosis not present

## 2023-12-14 DIAGNOSIS — I509 Heart failure, unspecified: Secondary | ICD-10-CM | POA: Diagnosis not present

## 2023-12-14 DIAGNOSIS — D6869 Other thrombophilia: Secondary | ICD-10-CM | POA: Diagnosis not present

## 2023-12-14 DIAGNOSIS — I4891 Unspecified atrial fibrillation: Secondary | ICD-10-CM | POA: Diagnosis not present

## 2023-12-25 ENCOUNTER — Other Ambulatory Visit: Payer: Self-pay | Admitting: Internal Medicine

## 2024-01-01 DIAGNOSIS — M9905 Segmental and somatic dysfunction of pelvic region: Secondary | ICD-10-CM | POA: Diagnosis not present

## 2024-01-01 DIAGNOSIS — M9903 Segmental and somatic dysfunction of lumbar region: Secondary | ICD-10-CM | POA: Diagnosis not present

## 2024-01-01 DIAGNOSIS — M5442 Lumbago with sciatica, left side: Secondary | ICD-10-CM | POA: Diagnosis not present

## 2024-01-01 DIAGNOSIS — M9906 Segmental and somatic dysfunction of lower extremity: Secondary | ICD-10-CM | POA: Diagnosis not present

## 2024-01-01 DIAGNOSIS — M9902 Segmental and somatic dysfunction of thoracic region: Secondary | ICD-10-CM | POA: Diagnosis not present

## 2024-01-01 DIAGNOSIS — M5441 Lumbago with sciatica, right side: Secondary | ICD-10-CM | POA: Diagnosis not present

## 2024-01-05 ENCOUNTER — Other Ambulatory Visit: Payer: Self-pay | Admitting: Neurosurgery

## 2024-01-16 ENCOUNTER — Ambulatory Visit: Attending: Cardiology | Admitting: *Deleted

## 2024-01-16 DIAGNOSIS — I10 Essential (primary) hypertension: Secondary | ICD-10-CM | POA: Diagnosis not present

## 2024-01-16 DIAGNOSIS — N1832 Chronic kidney disease, stage 3b: Secondary | ICD-10-CM | POA: Diagnosis not present

## 2024-01-16 DIAGNOSIS — Z952 Presence of prosthetic heart valve: Secondary | ICD-10-CM

## 2024-01-16 DIAGNOSIS — Z5181 Encounter for therapeutic drug level monitoring: Secondary | ICD-10-CM | POA: Diagnosis not present

## 2024-01-16 DIAGNOSIS — Z79899 Other long term (current) drug therapy: Secondary | ICD-10-CM | POA: Diagnosis not present

## 2024-01-16 LAB — POCT INR: INR: 2.2 (ref 2.0–3.0)

## 2024-01-16 NOTE — Progress Notes (Signed)
 INR 2.2; Please see anticoagulation encounter

## 2024-01-16 NOTE — Patient Instructions (Signed)
 Pending Lumbar laminectomy on 02/08/24 by Dr Gillie. Dr Cabbell is going to admit pt to hospital and manage taking pt off warfarin himself. Continue warfarin 1/2 tablet daily except 1 tablet on Mondays, Wednesdays and Fridays Continue greens Recheck 1 wk after after surgery

## 2024-02-03 ENCOUNTER — Ambulatory Visit (HOSPITAL_COMMUNITY): Admission: RE | Admit: 2024-02-03 | Source: Home / Self Care | Admitting: Neurosurgery

## 2024-02-04 ENCOUNTER — Other Ambulatory Visit: Payer: Self-pay

## 2024-02-04 ENCOUNTER — Encounter (HOSPITAL_COMMUNITY): Payer: Self-pay | Admitting: Neurosurgery

## 2024-02-04 ENCOUNTER — Encounter (HOSPITAL_COMMUNITY): Payer: Self-pay

## 2024-02-04 ENCOUNTER — Inpatient Hospital Stay (HOSPITAL_COMMUNITY)
Admission: RE | Admit: 2024-02-04 | Discharge: 2024-02-13 | DRG: 520 | Disposition: A | Discharge status: Home / Self Care | Source: Ambulatory Visit | Attending: Neurosurgery | Admitting: Neurosurgery

## 2024-02-04 DIAGNOSIS — Z952 Presence of prosthetic heart valve: Secondary | ICD-10-CM | POA: Diagnosis not present

## 2024-02-04 DIAGNOSIS — Z888 Allergy status to other drugs, medicaments and biological substances status: Secondary | ICD-10-CM

## 2024-02-04 DIAGNOSIS — Z7901 Long term (current) use of anticoagulants: Secondary | ICD-10-CM

## 2024-02-04 DIAGNOSIS — I10 Essential (primary) hypertension: Secondary | ICD-10-CM | POA: Diagnosis present

## 2024-02-04 DIAGNOSIS — Z885 Allergy status to narcotic agent status: Secondary | ICD-10-CM

## 2024-02-04 DIAGNOSIS — M5126 Other intervertebral disc displacement, lumbar region: Secondary | ICD-10-CM | POA: Diagnosis not present

## 2024-02-04 DIAGNOSIS — M5116 Intervertebral disc disorders with radiculopathy, lumbar region: Secondary | ICD-10-CM | POA: Diagnosis present

## 2024-02-04 DIAGNOSIS — Z87891 Personal history of nicotine dependence: Secondary | ICD-10-CM | POA: Diagnosis not present

## 2024-02-04 DIAGNOSIS — Z79899 Other long term (current) drug therapy: Secondary | ICD-10-CM

## 2024-02-04 LAB — PROTIME-INR
INR: 2.2 — ABNORMAL HIGH (ref 0.8–1.2)
Prothrombin Time: 25.1 s — ABNORMAL HIGH (ref 11.4–15.2)

## 2024-02-04 MED ORDER — ATORVASTATIN CALCIUM 40 MG PO TABS
40.0000 mg | ORAL_TABLET | Freq: Every day | ORAL | Status: DC
  Administered 2024-02-04 – 2024-02-13 (×10): 40 mg via ORAL
  Filled 2024-02-04 (×10): qty 1

## 2024-02-04 MED ORDER — CHLORHEXIDINE GLUCONATE CLOTH 2 % EX PADS
6.0000 | MEDICATED_PAD | Freq: Once | CUTANEOUS | Status: AC
  Administered 2024-02-08: 09:00:00 6 via TOPICAL

## 2024-02-04 MED ORDER — PANTOPRAZOLE SODIUM 40 MG PO TBEC
80.0000 mg | DELAYED_RELEASE_TABLET | Freq: Every day | ORAL | Status: DC
  Administered 2024-02-05 – 2024-02-12 (×7): 80 mg via ORAL
  Filled 2024-02-04 (×7): qty 2

## 2024-02-04 MED ORDER — CEFAZOLIN SODIUM-DEXTROSE 2-4 GM/100ML-% IV SOLN: 2.0000 g | INTRAVENOUS | Status: AC

## 2024-02-04 MED ORDER — METOPROLOL SUCCINATE ER 25 MG PO TB24
50.0000 mg | ORAL_TABLET | Freq: Every day | ORAL | Status: DC
  Administered 2024-02-04 – 2024-02-12 (×9): 50 mg via ORAL
  Filled 2024-02-04 (×9): qty 2

## 2024-02-04 MED ORDER — METHOCARBAMOL 500 MG PO TABS: 500.0000 mg | ORAL_TABLET | Freq: Three times a day (TID) | ORAL | Status: DC | PRN

## 2024-02-04 MED ORDER — CHLORHEXIDINE GLUCONATE CLOTH 2 % EX PADS: 6.0000 | MEDICATED_PAD | Freq: Once | CUTANEOUS | Status: DC

## 2024-02-04 MED ORDER — AMLODIPINE BESYLATE 5 MG PO TABS
5.0000 mg | ORAL_TABLET | Freq: Every day | ORAL | Status: DC
  Administered 2024-02-04 – 2024-02-12 (×8): 5 mg via ORAL
  Filled 2024-02-04 (×8): qty 1

## 2024-02-04 MED ORDER — ACETAMINOPHEN 500 MG PO TABS
1000.0000 mg | ORAL_TABLET | Freq: Four times a day (QID) | ORAL | Status: DC | PRN
  Administered 2024-02-08: 18:00:00 1000 mg via ORAL
  Filled 2024-02-04: qty 2

## 2024-02-04 MED ORDER — POTASSIUM CHLORIDE IN NACL 20-0.9 MEQ/L-% IV SOLN
INTRAVENOUS | Status: AC
  Filled 2024-02-04 (×2): qty 1000

## 2024-02-04 NOTE — Progress Notes (Signed)
 PHARMACY - ANTICOAGULATION CONSULT NOTE  Pharmacy Consult for IV heparin   Indication: warfarin PTA for AVR  Allergies[1]  Patient Measurements:    Vital Signs: Temp: 98.3 F (36.8 C) (12/14 1524) Temp Source: Oral (12/14 1524) BP: 149/64 (12/14 1524) Pulse Rate: 80 (12/14 1524)  Labs: No results for input(s): HGB, HCT, PLT, APTT, LABPROT, INR, HEPARINUNFRC, HEPRLOWMOCWT, CREATININE, CKTOTAL, CKMB, TROPONINIHS in the last 72 hours.  CrCl cannot be calculated (Patient's most recent lab result is older than the maximum 21 days allowed.).   Medical History: Past Medical History:  Diagnosis Date   Allergy    Ascending aortic aneurysm 2010    23-mm St. Jude mechanical valve conduit and re-implantation of the coronary arteries   Bicuspid aortic valve    Calculus of kidney    Essential hypertension    Long term current use of anticoagulant    Normocytic anemia    Pancytopenia (HCC)    Paraspinal muscle spasm    Assessment: Stephen Moses is a 78 y.o. year old male presented on 02/04/2024 with plans for neurosurgery on 12/18. On warfarin prior to admission for AVR. Prior to admission regimen, 5mg  MWF and 2.5mg  all other days (total weekly dose 25mg ). Last dose prior to admission 02/03/24 6pm. Pharmacy consulted for heparin  bridge peri-op.   Date INR Warfarin Dose  12/14 2.2, therapeutic   holding   Bleeding assessment: no overt bleeding noted   Goal of Therapy:  INR 2-3 Heparin  level 0.3-0.7 units/ml Monitor platelets by anticoagulation protocol: Yes   Plan:  Hold heparin  until INR <2  Daily INR, heparin  level, CBC, and monitoring for bleeding while on heparin   F/u plans for surgical intervention on 12/8 for need to hold heparin  infusion  Thank you for allowing pharmacy to participate in this patient's care.  Leonor GORMAN Bash, PharmD Emergency Medicine Clinical Pharmacist 02/04/2024,3:57 PM      [1]  Allergies Allergen Reactions    Codeine Nausea And Vomiting and Palpitations   Famotidine  Other (See Comments)    constipation

## 2024-02-04 NOTE — H&P (Signed)
 BP (!) 149/64 (BP Location: Left Arm)   Pulse 80   Temp 98.3 F (36.8 C) (Oral)   Resp 14   SpO2 98%   [name] comes in today at the behest of his physician, Dr. Gaither Langton.  Dr. Langton felt that a second opinion might be in order.  [name] is in significant pain in his left lower extremity and he describes weakness and he has had this now for at least 6 months.  He has been using a cane over the last 2 months because Dr. Langton emphasized that he would like him to do everything to avoid falling.  [name] vitals are as follows. He weighs 197 pounds.  Blood pressure is 138/78, pulse 55. Pain in his back is 0/10. Pain in his legs is significant, but he did not give a number to it.  He has also undergone 2 injections under the guidance of Dr. Duwayne, who did his first operation at L5-S1 approximately 15 years ago.  [name] states that the March injection, which is at L4-5 on the left and the subsequent main injection, which is at L5-S1 on the left did not provide any benefit with regard to his discomfort.  He says that the switch from back to left lower extremity occurred about 6 months ago.  Nothing that he can identify occurred at that time.   He denies bowel and/or bladder dysfunction outside of some mild constipation, which he has at times.  There have been no accidents.  He has a mechanical heart valve and he does need to stay on Coumadin  or at least be bridged when he has procedures done.  Dr. Melda did his operation and he has said Dr. McDale to the best of my understanding is his cardiologist.  We will need to contact them if we are going to proceed with procedure later.  PHYSICAL EXAMINATION: He is alert, oriented x4.  He answers all questions appropriately.  Memory, language, attention span, and fund of knowledge is normal.  Gait is mildly antalgic.  Holds a cane in his right hand.  On exam, he has on manual exam 5/5 strength in the lower extremities.  2+ reflexes at the knees. 2+ right  ankle. Trace to not existent at the left ankle.  He has 2+ reflexes, biceps, triceps, brachioradialis.  Pupils equal, round, and reactive to light.  Full extraocular movements.  Full visual fields.  Hearing is diminished.  This is the reason why he does wear hearing aids.  Symmetric smile.  Tongue, uvula midline.  IMAGING: MRI is reviewed.  What it does show is lateral recess stenosis and a disc herniation eccentric to the left at L4-5.  It is there, not the biggest disc, but is certainly not the smallest either and this certainly could be the cause of the symptoms that he has.  Where he had his operation at 5-1 actually looks quite good and the rest of his levels look just fine.  The conus and cauda are both normal.  ASSESSMENT AND PLAN: I explained to [name] that I felt a discectomy and decompression at 4-5 on the left might very well provide benefit.  That certainly is never a guarantee, but he does have something which corresponds to his pain.  Otherwise, I can draw a direct line from the MRI to his symptoms and presentation.  He is going to give this some thought.  Said he was leaning toward doing an operation.  We will have  to get in touch with the cardiologist.  My preference is always a Heparin  bridge as opposed to Lovenox  shots.  I am just unfortunately too old school with that, but either way, we will do what the cardiologist tells us  we need to do.

## 2024-02-05 LAB — PROTIME-INR
INR: 2.2 — ABNORMAL HIGH (ref 0.8–1.2)
Prothrombin Time: 25.3 s — ABNORMAL HIGH (ref 11.4–15.2)

## 2024-02-05 MED ORDER — DEXAMETHASONE SODIUM PHOSPHATE 4 MG/ML IJ SOLN
2.0000 mg | Freq: Two times a day (BID) | INTRAMUSCULAR | Status: DC
  Administered 2024-02-05 – 2024-02-11 (×12): 2 mg via INTRAVENOUS
  Filled 2024-02-05 (×12): qty 1

## 2024-02-05 NOTE — Plan of Care (Signed)

## 2024-02-05 NOTE — Progress Notes (Signed)
 PHARMACY - ANTICOAGULATION CONSULT NOTE  Pharmacy Consult for IV heparin   Indication: warfarin PTA for AVR  Allergies[1]  Patient Measurements: Height: 5' 7 (170.2 cm) Weight: 88.9 kg (195 lb 15.8 oz) IBW/kg (Calculated) : 66.1 HEPARIN  DW (KG): 84.5  Vital Signs: Temp: 97.6 F (36.4 C) (12/15 1118) Temp Source: Oral (12/15 1118) BP: 148/73 (12/15 1118) Pulse Rate: 51 (12/15 1118)  Labs: Recent Labs    02/04/24 1617 02/05/24 0448  LABPROT 25.1* 25.3*  INR 2.2* 2.2*    CrCl cannot be calculated (Patient's most recent lab result is older than the maximum 21 days allowed.).   Assessment: Stephen Moses is a 78 y.o. year old male presented on 02/04/2024 with plans for neurosurgery on 12/18. On warfarin prior to admission for AVR.  Pharmacy consulted for heparin  bridge peri-op.   INR remains therapeutic; no bleeding reported.  Goal of Therapy:  Heparin  level 0.3-0.7 units/ml Monitor platelets by anticoagulation protocol: Yes   Plan:  Daily PT / INR Start IV heparin  when INR < 2  Stephen Moses, PharmD, BCPS, BCCCP 02/05/2024, 2:15 PM     [1]  Allergies Allergen Reactions   Codeine Nausea And Vomiting and Palpitations   Famotidine  Other (See Comments)    constipation

## 2024-02-05 NOTE — Progress Notes (Signed)
 Patient ID: KETIH GOODIE, male   DOB: 1945/10/03, 78 y.o.   MRN: [id number] BP (!) 149/73   Pulse 62   Temp 97.9 F (36.6 C) (Oral)   Resp 17   Ht 5' 7 (1.702 m)   Wt 88.9 kg   SpO2 97%   BMI 30.70 kg/m  Alert and oriented x 4 Speech is clear and fluent Moving all extremities well Heparin  will start when inr is less than 2, currently 2.2

## 2024-02-06 ENCOUNTER — Ambulatory Visit (HOSPITAL_COMMUNITY): Payer: Self-pay

## 2024-02-06 LAB — CBC
HCT: 39.3 % (ref 39.0–52.0)
Hemoglobin: 13.1 g/dL (ref 13.0–17.0)
MCH: 29.2 pg (ref 26.0–34.0)
MCHC: 33.3 g/dL (ref 30.0–36.0)
MCV: 87.5 fL (ref 80.0–100.0)
Platelets: 219 K/uL (ref 150–400)
RBC: 4.49 MIL/uL (ref 4.22–5.81)
RDW: 13.8 % (ref 11.5–15.5)
WBC: 6.7 K/uL (ref 4.0–10.5)
nRBC: 0 % (ref 0.0–0.2)

## 2024-02-06 LAB — BASIC METABOLIC PANEL WITH GFR
Anion gap: 9 (ref 5–15)
BUN: 23 mg/dL (ref 8–23)
CO2: 21 mmol/L — ABNORMAL LOW (ref 22–32)
Calcium: 9.2 mg/dL (ref 8.9–10.3)
Chloride: 109 mmol/L (ref 98–111)
Creatinine, Ser: 1.8 mg/dL — ABNORMAL HIGH (ref 0.61–1.24)
GFR, Estimated: 38 mL/min — ABNORMAL LOW (ref >60)
Glucose, Bld: 134 mg/dL — ABNORMAL HIGH (ref 70–99)
Potassium: 4.6 mmol/L (ref 3.5–5.1)
Sodium: 139 mmol/L (ref 135–145)

## 2024-02-06 LAB — HEPARIN LEVEL (UNFRACTIONATED)
Heparin Unfractionated: 0.13 [IU]/mL — ABNORMAL LOW (ref 0.30–0.70)
Heparin Unfractionated: 0.6 [IU]/mL (ref 0.30–0.70)

## 2024-02-06 LAB — PROTIME-INR
INR: 1.7 — ABNORMAL HIGH (ref 0.8–1.2)
Prothrombin Time: 20.5 s — ABNORMAL HIGH (ref 11.4–15.2)

## 2024-02-06 MED ORDER — HEPARIN BOLUS VIA INFUSION
2000.0000 [IU] | Freq: Once | INTRAVENOUS | Status: AC
  Administered 2024-02-06: 15:00:00 2000 [IU] via INTRAVENOUS
  Filled 2024-02-06: qty 2000

## 2024-02-06 MED ORDER — HEPARIN (PORCINE) 25000 UT/250ML-% IV SOLN
1200.0000 [IU]/h | INTRAVENOUS | Status: AC
  Administered 2024-02-06: 09:00:00 1000 [IU]/h via INTRAVENOUS
  Administered 2024-02-07 – 2024-02-08 (×2): 1200 [IU]/h via INTRAVENOUS
  Filled 2024-02-06 (×3): qty 250

## 2024-02-06 NOTE — Plan of Care (Signed)

## 2024-02-06 NOTE — Plan of Care (Signed)
   Problem: Education: Goal: Knowledge of General Education information will improve Description Including pain rating scale, medication(s)/side effects and non-pharmacologic comfort measures Outcome: Progressing   Problem: Health Behavior/Discharge Planning: Goal: Ability to manage health-related needs will improve Outcome: Progressing

## 2024-02-06 NOTE — Progress Notes (Signed)
 Patient ID: Stephen Moses, male   DOB: 08-Feb-1946, 78 y.o.   MRN: [id number] BP 131/67   Pulse 67   Temp 98 F (36.7 C)   Resp 18   Ht 5' 7 (1.702 m)   Wt 88.9 kg   SpO2 100%   BMI 30.70 kg/m  Alert and oriented. Heparin  started today Moving all extremities well On decadron 

## 2024-02-06 NOTE — Progress Notes (Addendum)
 PHARMACY - ANTICOAGULATION CONSULT NOTE  Pharmacy Consult for IV heparin   Indication: warfarin PTA for AVR  Allergies[1]  Patient Measurements: Height: 5' 7 (170.2 cm) Weight: 88.9 kg (195 lb 15.8 oz) IBW/kg (Calculated) : 66.1 HEPARIN  DW (KG): 84.5  Vital Signs: Temp: 97.9 F (36.6 C) (12/16 0525) Temp Source: Oral (12/16 0525) BP: 117/60 (12/16 0525) Pulse Rate: 57 (12/16 0525)  Labs: Recent Labs    02/04/24 1617 02/05/24 0448 02/06/24 0519  LABPROT 25.1* 25.3* 20.5*  INR 2.2* 2.2* 1.7*  CREATININE  --   --  1.80*    Estimated Creatinine Clearance: 36.6 mL/min (A) (by C-G formula based on SCr of 1.8 mg/dL (H)).   Medical History: Past Medical History:  Diagnosis Date   Allergy    Ascending aortic aneurysm 2010    23-mm St. Jude mechanical valve conduit and re-implantation of the coronary arteries   Bicuspid aortic valve    Calculus of kidney    Essential hypertension    Long term current use of anticoagulant    Normocytic anemia    Pancytopenia (HCC)    Paraspinal muscle spasm    Assessment: Stephen Moses is a 78 y.o. year old male presented on 02/04/2024 with plans for neurosurgery on 12/18. On warfarin prior to admission for AVR. Prior to admission regimen, 5mg  MWF and 2.5mg  all other days (total weekly dose 25mg ). Last dose prior to admission 02/03/24 6pm. Pharmacy consulted for heparin  bridge peri-op.   Date INR Warfarin Dose  12/14 2.2, therapeutic   Holding heparin , holding warfarin  12/15 2.2, therapeutic  Holding heparin , holding warfarin  12/16 1.7, subtherapeutic  Holding warfarin   Bleeding assessment: no overt bleeding noted   Goal of Therapy:  INR 2-3 Heparin  level 0.3-0.7 units/ml Monitor platelets by anticoagulation protocol: Yes   Plan:  Heparin  to start now INR <2 Start IV Heparin  1000 units/hr (no bolus) 8h heparin  level Daily INR, heparin  level, CBC, and monitoring for bleeding while on heparin   F/u plans for surgical  intervention on 12/18 for need to hold heparin  infusion  Thank you for allowing pharmacy to participate in this patient's care.  Leonor GORMAN Bash, PharmD Emergency Medicine Clinical Pharmacist 02/06/2024,6:59 AM  ----- Addendum:  Heparin  level 0.13, subtherapeutic  No issues with bleeding or infusion noted.   Plan:  Heparin  2000 unit bolus x 1  Increase heparin  infusion to 1200 units/hr 8h heparin  level  Daily heparin  level, CBC, and monitoring for bleeding F/u plans for anticoagulation    Thank you for allowing pharmacy to participate in this patient's care.  Leonor GORMAN Bash, PharmD Emergency Medicine Clinical Pharmacist 02/06/2024,2:25 PM        [1]  Allergies Allergen Reactions   Codeine Nausea And Vomiting and Palpitations   Famotidine  Other (See Comments)    constipation

## 2024-02-06 NOTE — Progress Notes (Signed)
 PHARMACY - ANTICOAGULATION CONSULT NOTE  Pharmacy Consult for IV heparin   Indication: warfarin PTA for AVR  Allergies[1]  Patient Measurements: Height: 5' 7 (170.2 cm) Weight: 88.9 kg (195 lb 15.8 oz) IBW/kg (Calculated) : 66.1 HEPARIN  DW (KG): 84.5  Vital Signs: Temp: 98.3 F (36.8 C) (12/16 2112) BP: 147/74 (12/16 2112) Pulse Rate: 68 (12/16 2112)  Labs: Recent Labs    02/04/24 1617 02/05/24 0448 02/06/24 0519 02/06/24 1346 02/06/24 2146  HGB  --   --  13.1  --   --   HCT  --   --  39.3  --   --   PLT  --   --  219  --   --   LABPROT 25.1* 25.3* 20.5*  --   --   INR 2.2* 2.2* 1.7*  --   --   HEPARINUNFRC  --   --   --  0.13* 0.60  CREATININE  --   --  1.80*  --   --     Estimated Creatinine Clearance: 36.6 mL/min (A) (by C-G formula based on SCr of 1.8 mg/dL (H)).   Medical History: Past Medical History:  Diagnosis Date   Allergy    Ascending aortic aneurysm 2010    23-mm St. Jude mechanical valve conduit and re-implantation of the coronary arteries   Bicuspid aortic valve    Calculus of kidney    Essential hypertension    Long term current use of anticoagulant    Normocytic anemia    Pancytopenia (HCC)    Paraspinal muscle spasm    Assessment: Stephen Moses is a 78 y.o. year old male presented on 02/04/2024 with plans for neurosurgery on 12/18. On warfarin prior to admission for AVR. Prior to admission regimen, 5mg  MWF and 2.5mg  all other days (total weekly dose 25mg ). Last dose prior to admission 02/03/24 6pm. Pharmacy consulted for heparin  bridge peri-op.   Date INR Warfarin Dose  12/14 2.2, therapeutic   Holding heparin , holding warfarin  12/15 2.2, therapeutic  Holding heparin , holding warfarin  12/16 1.7, subtherapeutic  Holding warfarin   PM update: heparin  level 0.6 is therapeutic on 1200 units/hr. No issues with the infusion or bleeding reported per RN.  Goal of Therapy:  INR 2-3 Heparin  level 0.3-0.7 units/ml Monitor platelets by  anticoagulation protocol: Yes   Plan:  Continue heparin  infusion at 1200 units/hr Check confirmatory heparin  level with AM labs Daily heparin  level, CBC, and monitoring for bleeding F/u plans for anticoagulation   Thank you for allowing pharmacy to participate in this patient's care.  Rocky Slade, PharmD, BCPS 02/06/2024,10:34 PM  Please check AMION for all [facility] Pharmacy phone numbers After 10:00 PM, call Main Pharmacy [phone]          [1]  Allergies Allergen Reactions   Codeine Nausea And Vomiting and Palpitations   Famotidine  Other (See Comments)    constipation

## 2024-02-07 LAB — CBC
HCT: 35.4 % — ABNORMAL LOW (ref 39.0–52.0)
Hemoglobin: 12 g/dL — ABNORMAL LOW (ref 13.0–17.0)
MCH: 29.2 pg (ref 26.0–34.0)
MCHC: 33.9 g/dL (ref 30.0–36.0)
MCV: 86.1 fL (ref 80.0–100.0)
Platelets: 204 K/uL (ref 150–400)
RBC: 4.11 MIL/uL — ABNORMAL LOW (ref 4.22–5.81)
RDW: 13.8 % (ref 11.5–15.5)
WBC: 9.9 K/uL (ref 4.0–10.5)
nRBC: 0.4 % — ABNORMAL HIGH (ref 0.0–0.2)

## 2024-02-07 LAB — HEPARIN LEVEL (UNFRACTIONATED): Heparin Unfractionated: 0.45 [IU]/mL (ref 0.30–0.70)

## 2024-02-07 LAB — PROTIME-INR
INR: 1.5 — ABNORMAL HIGH (ref 0.8–1.2)
Prothrombin Time: 18.7 s — ABNORMAL HIGH (ref 11.4–15.2)

## 2024-02-07 NOTE — Progress Notes (Signed)
 Patient ID: Stephen Moses, male   DOB: 08/20/1945, 78 y.o.   MRN: [id number] BP 133/64 (BP Location: Right Arm)   Pulse 69   Temp 98.4 F (36.9 C) (Oral)   Resp 16   Ht 5' 7 (1.702 m)   Wt 88.9 kg   SpO2 99%   BMI 30.70 kg/m  Alert and oriented x 4, speech is clear and fluent. Moving all extremities well.  OR tomorrow for discetomy

## 2024-02-07 NOTE — Care Management Important Message (Signed)
 Important Message  Patient Details  Name: Stephen Moses MRN: [id number] Date of Birth: 1946/02/04   Important Message Given:  Yes - Medicare IM     [name] 02/07/2024, 3:15 PM

## 2024-02-07 NOTE — Plan of Care (Signed)

## 2024-02-07 NOTE — Progress Notes (Signed)
 PHARMACY - ANTICOAGULATION CONSULT NOTE  Pharmacy Consult for IV heparin   Indication: warfarin PTA for AVR  Allergies[1]  Patient Measurements: Height: 5' 7 (170.2 cm) Weight: 88.9 kg (195 lb 15.8 oz) IBW/kg (Calculated) : 66.1 HEPARIN  DW (KG): 84.5  Vital Signs: Temp: 98 F (36.7 C) (12/17 0500) Temp Source: Oral (12/17 0500) BP: 114/64 (12/17 0500) Pulse Rate: 51 (12/17 0500)  Labs: Recent Labs    02/05/24 0448 02/06/24 0519 02/06/24 1346 02/06/24 2146 02/07/24 0538  HGB  --  13.1  --   --  12.0*  HCT  --  39.3  --   --  35.4*  PLT  --  219  --   --  204  LABPROT 25.3* 20.5*  --   --  18.7*  INR 2.2* 1.7*  --   --  1.5*  HEPARINUNFRC  --   --  0.13* 0.60 0.45  CREATININE  --  1.80*  --   --   --     Estimated Creatinine Clearance: 36.6 mL/min (A) (by C-G formula based on SCr of 1.8 mg/dL (H)).   Medical History: Past Medical History:  Diagnosis Date   Allergy    Ascending aortic aneurysm 2010    23-mm St. Jude mechanical valve conduit and re-implantation of the coronary arteries   Bicuspid aortic valve    Calculus of kidney    Essential hypertension    Long term current use of anticoagulant    Normocytic anemia    Pancytopenia (HCC)    Paraspinal muscle spasm    Assessment: Stephen Moses is a 78 y.o. year old male presented on 02/04/2024 with plans for neurosurgery on 12/18. On warfarin prior to admission for AVR. Prior to admission regimen, 5mg  MWF and 2.5mg  all other days (total weekly dose 25mg ). Last dose prior to admission 02/03/24 6pm. Pharmacy consulted for heparin  bridge peri-op.   Date INR Anticoagulation  12/14 2.2, therapeutic   Holding heparin , holding warfarin  12/15 2.2, therapeutic  Holding heparin , holding warfarin  12/16 1.7, subtherapeutic  Holding warfarin, started heparin   12/17 1.5, subtherapeutic  Holding warfarin, on heparin    Heparin  level 0.45, therapeutic  CBC stable, No issues with bleeding or infusion noted.   Goal of  Therapy:  INR 2-3 Heparin  level 0.3-0.7 units/ml Monitor platelets by anticoagulation protocol: Yes   Plan:  Continue heparin  infusion at 1200 units/hr Daily heparin  level, CBC, and monitoring for bleeding F/u plans for anticoagulation - surgery planned for 12/18  Thank you for allowing pharmacy to participate in this patient's care.  Leonor GORMAN Bash, PharmD Emergency Medicine Clinical Pharmacist 02/07/2024,7:15 AM            [1]  Allergies Allergen Reactions   Codeine Nausea And Vomiting and Palpitations   Famotidine  Other (See Comments)    constipation

## 2024-02-08 ENCOUNTER — Encounter (HOSPITAL_COMMUNITY): Payer: Self-pay | Admitting: Vascular Surgery

## 2024-02-08 ENCOUNTER — Inpatient Hospital Stay (HOSPITAL_COMMUNITY): Admission: RE | Disposition: A | Payer: Self-pay | Source: Home / Self Care | Attending: Neurosurgery

## 2024-02-08 ENCOUNTER — Other Ambulatory Visit: Payer: Self-pay

## 2024-02-08 ENCOUNTER — Inpatient Hospital Stay (HOSPITAL_COMMUNITY): Payer: Self-pay | Admitting: Vascular Surgery

## 2024-02-08 ENCOUNTER — Inpatient Hospital Stay (HOSPITAL_COMMUNITY)

## 2024-02-08 ENCOUNTER — Encounter (HOSPITAL_COMMUNITY): Payer: Self-pay | Admitting: Neurosurgery

## 2024-02-08 DIAGNOSIS — I10 Essential (primary) hypertension: Secondary | ICD-10-CM

## 2024-02-08 DIAGNOSIS — Z87891 Personal history of nicotine dependence: Secondary | ICD-10-CM | POA: Diagnosis not present

## 2024-02-08 DIAGNOSIS — M5126 Other intervertebral disc displacement, lumbar region: Secondary | ICD-10-CM | POA: Diagnosis present

## 2024-02-08 HISTORY — PX: LUMBAR LAMINECTOMY/DECOMPRESSION MICRODISCECTOMY: SHX5026

## 2024-02-08 LAB — SURGICAL PCR SCREEN
MRSA, PCR: NEGATIVE
Staphylococcus aureus: NEGATIVE

## 2024-02-08 LAB — CBC
HCT: 39.2 % (ref 39.0–52.0)
Hemoglobin: 13 g/dL (ref 13.0–17.0)
MCH: 29.3 pg (ref 26.0–34.0)
MCHC: 33.2 g/dL (ref 30.0–36.0)
MCV: 88.3 fL (ref 80.0–100.0)
Platelets: 212 K/uL (ref 150–400)
RBC: 4.44 MIL/uL (ref 4.22–5.81)
RDW: 14 % (ref 11.5–15.5)
WBC: 11 K/uL — ABNORMAL HIGH (ref 4.0–10.5)
nRBC: 0 % (ref 0.0–0.2)

## 2024-02-08 LAB — HEPARIN LEVEL (UNFRACTIONATED): Heparin Unfractionated: 0.73 [IU]/mL — ABNORMAL HIGH (ref 0.30–0.70)

## 2024-02-08 LAB — PROTIME-INR
INR: 1.4 — ABNORMAL HIGH (ref 0.8–1.2)
Prothrombin Time: 17.6 s — ABNORMAL HIGH (ref 11.4–15.2)

## 2024-02-08 SURGERY — LUMBAR LAMINECTOMY/DECOMPRESSION MICRODISCECTOMY 1 LEVEL
Anesthesia: General | Laterality: Left

## 2024-02-08 MED ORDER — SENNOSIDES-DOCUSATE SODIUM 8.6-50 MG PO TABS
1.0000 | ORAL_TABLET | Freq: Every evening | ORAL | Status: DC | PRN
  Administered 2024-02-08: 20:00:00 1 via ORAL
  Filled 2024-02-08: qty 1

## 2024-02-08 MED ORDER — HYDROCODONE-ACETAMINOPHEN 7.5-325 MG PO TABS
1.0000 | ORAL_TABLET | Freq: Four times a day (QID) | ORAL | Status: DC
  Administered 2024-02-08 – 2024-02-12 (×14): 1 via ORAL
  Filled 2024-02-08 (×16): qty 1

## 2024-02-08 MED ORDER — ONDANSETRON HCL 4 MG/2ML IJ SOLN: 4.0000 mg | Freq: Four times a day (QID) | INTRAMUSCULAR | Status: DC | PRN

## 2024-02-08 MED ORDER — CEFAZOLIN SODIUM-DEXTROSE 2-3 GM-%(50ML) IV SOLR
INTRAVENOUS | Status: DC | PRN
  Administered 2024-02-08: 15:00:00 2 g via INTRAVENOUS

## 2024-02-08 MED ORDER — SODIUM CHLORIDE 0.9 % IV SOLN: 250.0000 mL | INTRAVENOUS | Status: AC

## 2024-02-08 MED ORDER — PHENOL 1.4 % MT LIQD: 1.0000 | OROMUCOSAL | Status: DC | PRN

## 2024-02-08 MED ORDER — OXYCODONE HCL 5 MG PO TABS: 10.0000 mg | ORAL_TABLET | ORAL | Status: DC | PRN

## 2024-02-08 MED ORDER — ONDANSETRON HCL 4 MG/2ML IJ SOLN: 4.0000 mg | Freq: Once | INTRAMUSCULAR | Status: DC | PRN

## 2024-02-08 MED ORDER — ALUM & MAG HYDROXIDE-SIMETH 200-200-20 MG/5ML PO SUSP: 30.0000 mL | Freq: Four times a day (QID) | ORAL | Status: DC | PRN

## 2024-02-08 MED ORDER — BUPIVACAINE HCL (PF) 0.5 % IJ SOLN
INTRAMUSCULAR | Status: DC | PRN
  Administered 2024-02-08: 16:00:00 20 mL

## 2024-02-08 MED ORDER — LIDOCAINE-EPINEPHRINE 0.5 %-1:200000 IJ SOLN
INTRAMUSCULAR | Status: DC | PRN
  Administered 2024-02-08: 16:00:00 6 mL

## 2024-02-08 MED ORDER — LACTATED RINGERS IV SOLN: INTRAVENOUS | Status: DC

## 2024-02-08 MED ORDER — POTASSIUM CHLORIDE IN NACL 20-0.9 MEQ/L-% IV SOLN
INTRAVENOUS | Status: DC
  Filled 2024-02-08 (×2): qty 1000

## 2024-02-08 MED ORDER — BISACODYL 5 MG PO TBEC
5.0000 mg | DELAYED_RELEASE_TABLET | Freq: Every day | ORAL | Status: DC | PRN
  Administered 2024-02-11: 5 mg via ORAL
  Filled 2024-02-08: qty 1

## 2024-02-08 MED ORDER — MENTHOL 3 MG MT LOZG: 1.0000 | LOZENGE | OROMUCOSAL | Status: DC | PRN

## 2024-02-08 MED ORDER — ONDANSETRON HCL 4 MG PO TABS: 4.0000 mg | ORAL_TABLET | Freq: Four times a day (QID) | ORAL | Status: DC | PRN

## 2024-02-08 MED ORDER — OXYCODONE HCL 5 MG PO TABS: 5.0000 mg | ORAL_TABLET | ORAL | Status: DC | PRN

## 2024-02-08 MED ORDER — SENNA 8.6 MG PO TABS
1.0000 | ORAL_TABLET | Freq: Two times a day (BID) | ORAL | Status: DC
  Administered 2024-02-09 – 2024-02-13 (×8): 8.6 mg via ORAL
  Filled 2024-02-08 (×9): qty 1

## 2024-02-08 MED ORDER — CEFAZOLIN SODIUM 1 G IJ SOLR
INTRAMUSCULAR | Status: AC
  Filled 2024-02-08: qty 40

## 2024-02-08 MED ORDER — PROPOFOL 10 MG/ML IV BOLUS
INTRAVENOUS | Status: AC
  Filled 2024-02-08: qty 20

## 2024-02-08 MED ORDER — SODIUM CHLORIDE 0.9% FLUSH
3.0000 mL | Freq: Two times a day (BID) | INTRAVENOUS | Status: DC
  Administered 2024-02-08 – 2024-02-13 (×9): 3 mL via INTRAVENOUS

## 2024-02-08 MED ORDER — AMISULPRIDE (ANTIEMETIC) 5 MG/2ML IV SOLN: 10.0000 mg | Freq: Once | INTRAVENOUS | Status: DC | PRN

## 2024-02-08 MED ORDER — THROMBIN 5000 UNITS EX KIT
PACK | CUTANEOUS | Status: AC
  Filled 2024-02-08: qty 2

## 2024-02-08 MED ORDER — BUPIVACAINE HCL (PF) 0.5 % IJ SOLN
INTRAMUSCULAR | Status: AC
  Filled 2024-02-08: qty 30

## 2024-02-08 MED ORDER — FENTANYL CITRATE (PF) 250 MCG/5ML IJ SOLN
INTRAMUSCULAR | Status: AC
  Filled 2024-02-08: qty 5

## 2024-02-08 MED ORDER — CHLORHEXIDINE GLUCONATE 0.12 % MT SOLN
15.0000 mL | Freq: Once | OROMUCOSAL | Status: AC
  Administered 2024-02-08: 12:00:00 15 mL via OROMUCOSAL

## 2024-02-08 MED ORDER — DEXAMETHASONE SOD PHOSPHATE PF 10 MG/ML IJ SOLN
INTRAMUSCULAR | Status: DC | PRN
  Administered 2024-02-08: 15:00:00 10 mg via INTRAVENOUS

## 2024-02-08 MED ORDER — FENTANYL CITRATE (PF) 100 MCG/2ML IJ SOLN: 25.0000 ug | INTRAMUSCULAR | Status: DC | PRN

## 2024-02-08 MED ORDER — SUGAMMADEX SODIUM 200 MG/2ML IV SOLN
INTRAVENOUS | Status: DC | PRN
  Administered 2024-02-08: 17:00:00 200 mg via INTRAVENOUS

## 2024-02-08 MED ORDER — ACETAMINOPHEN 10 MG/ML IV SOLN: 1000.0000 mg | Freq: Once | INTRAVENOUS | Status: DC | PRN

## 2024-02-08 MED ORDER — ONDANSETRON HCL 4 MG/2ML IJ SOLN
INTRAMUSCULAR | Status: DC | PRN
  Administered 2024-02-08: 15:00:00 4 mg via INTRAVENOUS

## 2024-02-08 MED ORDER — PHENYLEPHRINE HCL-NACL 20-0.9 MG/250ML-% IV SOLN
INTRAVENOUS | Status: DC | PRN
  Administered 2024-02-08: 16:00:00 15 ug/min via INTRAVENOUS

## 2024-02-08 MED ORDER — ORAL CARE MOUTH RINSE: 15.0000 mL | Freq: Once | OROMUCOSAL | Status: AC

## 2024-02-08 MED ORDER — SODIUM CHLORIDE 0.9% FLUSH: 3.0000 mL | INTRAVENOUS | Status: DC | PRN

## 2024-02-08 MED ORDER — LIDOCAINE-EPINEPHRINE 0.5 %-1:200000 IJ SOLN
INTRAMUSCULAR | Status: AC
  Filled 2024-02-08: qty 50

## 2024-02-08 MED ORDER — THROMBIN (RECOMBINANT) 5000 UNITS EX SOLR
CUTANEOUS | Status: DC | PRN
  Administered 2024-02-08: 16:00:00 10 mL via TOPICAL

## 2024-02-08 MED ORDER — MAGNESIUM CITRATE PO SOLN
1.0000 | Freq: Once | ORAL | Status: AC | PRN
  Administered 2024-02-12: 1 via ORAL
  Filled 2024-02-08: qty 296

## 2024-02-08 MED ORDER — FENTANYL CITRATE (PF) 250 MCG/5ML IJ SOLN
INTRAMUSCULAR | Status: DC | PRN
  Administered 2024-02-08: 15:00:00 100 ug via INTRAVENOUS
  Administered 2024-02-08: 15:00:00 50 ug via INTRAVENOUS

## 2024-02-08 MED ADMIN — PROPOFOL 200 MG/20ML IV EMUL: 100 mg | INTRAVENOUS | @ 15:00:00 | NDC 00069020910

## 2024-02-08 MED ADMIN — Rocuronium Bromide IV Soln 100 MG/10ML (10 MG/ML): 20 mg | INTRAVENOUS | @ 16:00:00 | NDC 99999070048

## 2024-02-08 MED ADMIN — Sodium Chloride Irrigation Soln 0.9%: 1000 mL | @ 16:00:00 | NDC 99999050048

## 2024-02-08 MED ADMIN — Rocuronium Bromide IV Soln 100 MG/10ML (10 MG/ML): 60 mg | INTRAVENOUS | @ 15:00:00 | NDC 00143925110

## 2024-02-08 MED ADMIN — Lidocaine HCl(Cardiac) IV PF Soln Pref Syr 100 MG/5ML (2%): 60 mg | INTRATRACHEAL | @ 15:00:00 | NDC 00409132305

## 2024-02-08 MED FILL — Rocuronium Bromide IV Soln Pref Syr 100 MG/10ML (10 MG/ML): INTRAVENOUS | Qty: 10 | Status: AC

## 2024-02-08 MED FILL — Lidocaine HCl Local Soln Prefilled Syringe 100 MG/5ML (2%): INTRAMUSCULAR | Qty: 5 | Status: AC

## 2024-02-08 MED FILL — Sugammadex Sodium IV 200 MG/2ML (Base Equivalent): INTRAVENOUS | Qty: 2 | Status: AC

## 2024-02-08 MED FILL — Ondansetron HCl Inj 4 MG/2ML (2 MG/ML): INTRAMUSCULAR | Qty: 2 | Status: AC

## 2024-02-08 MED FILL — Ephedrine Sulf-NaCl PF Pref Syr 50 MG/10ML-0.9% (5 MG/ML): INTRAVENOUS | Qty: 5 | Status: AC

## 2024-02-08 MED FILL — henylephrine-NaCl Pref Syr 0.8 MG/10ML-0.9% (80 MCG/ML): INTRAVENOUS | Qty: 10 | Status: AC

## 2024-02-08 SURGICAL SUPPLY — 37 items
BAG COUNTER SPONGE SURGICOUNT (BAG) ×1 IMPLANT
BENZOIN TINCTURE PRP APPL 2/3 (GAUZE/BANDAGES/DRESSINGS) IMPLANT
BLADE CLIPPER SURG (BLADE) IMPLANT
BUR MATCHSTICK NEURO 3.0 LAGG (BURR) ×1 IMPLANT
BUR PRECISION FLUTE 5.0 (BURR) IMPLANT
CANISTER SUCTION 3000ML PPV (SUCTIONS) ×1 IMPLANT
DERMABOND ADVANCED .7 DNX12 (GAUZE/BANDAGES/DRESSINGS) ×1 IMPLANT
DRAPE LAPAROTOMY 100X72X124 (DRAPES) ×1 IMPLANT
DRAPE MICROSCOPE LEICA (MISCELLANEOUS) ×1 IMPLANT
DRAPE SURG 17X23 STRL (DRAPES) ×1 IMPLANT
DURAPREP 26ML APPLICATOR (WOUND CARE) ×1 IMPLANT
ELECTRODE REM PT RTRN 9FT ADLT (ELECTROSURGICAL) ×1 IMPLANT
GAUZE 4X4 16PLY ~~LOC~~+RFID DBL (SPONGE) IMPLANT
GAUZE SPONGE 4X4 12PLY STRL (GAUZE/BANDAGES/DRESSINGS) IMPLANT
GLOVE BIOGEL PI MICRO STRL 6.5 (GLOVE) ×1 IMPLANT
GOWN STRL REUS W/ TWL LRG LVL3 (GOWN DISPOSABLE) ×2 IMPLANT
GOWN STRL REUS W/ TWL XL LVL3 (GOWN DISPOSABLE) IMPLANT
GOWN STRL REUS W/TWL 2XL LVL3 (GOWN DISPOSABLE) IMPLANT
KIT BASIN OR (CUSTOM PROCEDURE TRAY) ×1 IMPLANT
KIT TURNOVER KIT B (KITS) ×1 IMPLANT
NDL HYPO 25X1 1.5 SAFETY (NEEDLE) ×1 IMPLANT
NDL SPNL 18GX3.5 QUINCKE PK (NEEDLE) IMPLANT
NEEDLE HYPO 25X1 1.5 SAFETY (NEEDLE) ×1 IMPLANT
NEEDLE SPNL 18GX3.5 QUINCKE PK (NEEDLE) IMPLANT
PACK LAMINECTOMY NEURO (CUSTOM PROCEDURE TRAY) ×1 IMPLANT
PAD ARMBOARD POSITIONER FOAM (MISCELLANEOUS) ×3 IMPLANT
SOLN 0.9% NACL POUR BTL 1000ML (IV SOLUTION) ×1 IMPLANT
SOLN STERILE WATER BTL 1000 ML (IV SOLUTION) ×1 IMPLANT
SPIKE FLUID TRANSFER (MISCELLANEOUS) ×1 IMPLANT
SPONGE SURGIFOAM ABS GEL SZ50 (HEMOSTASIS) ×1 IMPLANT
SPONGE T-LAP 4X18 ~~LOC~~+RFID (SPONGE) IMPLANT
STRIP CLOSURE SKIN 1/2X4 (GAUZE/BANDAGES/DRESSINGS) IMPLANT
SUT VIC AB 0 CT1 18XCR BRD8 (SUTURE) ×1 IMPLANT
SUT VIC AB 2-0 CT1 18 (SUTURE) ×1 IMPLANT
SUT VIC AB 3-0 SH 8-18 (SUTURE) ×1 IMPLANT
TOWEL GREEN STERILE (TOWEL DISPOSABLE) ×1 IMPLANT
TOWEL GREEN STERILE FF (TOWEL DISPOSABLE) ×1 IMPLANT

## 2024-02-08 NOTE — Plan of Care (Signed)
 Patient ambulating in room.  Problem: Education: Goal: Knowledge of General Education information will improve Description: Including pain rating scale, medication(s)/side effects and non-pharmacologic comfort measures Outcome: Progressing   Problem: Health Behavior/Discharge Planning: Goal: Ability to manage health-related needs will improve Outcome: Progressing   Problem: Clinical Measurements: Goal: Ability to maintain clinical measurements within normal limits will improve Outcome: Progressing Goal: Will remain free from infection Outcome: Progressing Goal: Diagnostic test results will improve Outcome: Progressing Goal: Respiratory complications will improve Outcome: Progressing Goal: Cardiovascular complication will be avoided Outcome: Progressing

## 2024-02-08 NOTE — Transfer of Care (Signed)
 Immediate Anesthesia Transfer of Care Note  Patient: Stephen Moses  Procedure(s) Performed: LUMBAR LAMINECTOMY/DECOMPRESSION MICRODISCECTOMY 1 LEVEL (Left)  Patient Location: PACU  Anesthesia Type:General  Level of Consciousness: awake, alert , and oriented  Airway & Oxygen Therapy: Patient Spontanous Breathing  Post-op Assessment: Report given to RN and Post -op Vital signs reviewed and stable  Post vital signs: Reviewed and stable  Last Vitals:  Vitals Value Taken Time  BP 125/75 02/08/24 16:49  Temp 36.5 C 02/08/24 16:49  Pulse 73 02/08/24 16:51  Resp 16 02/08/24 16:51  SpO2 95 % 02/08/24 16:51  Vitals shown include unfiled device data.  Last Pain:  Vitals:   02/08/24 1213  TempSrc:   PainSc: 0-No pain         Complications: No notable events documented.

## 2024-02-08 NOTE — Anesthesia Procedure Notes (Signed)
 Procedure Name: Intubation Date/Time: 02/08/2024 2:46 PM  Performed by: Roslynn [name] LABOR, CRNAPre-anesthesia Checklist: Patient identified, Emergency Drugs available, Suction available and Patient being monitored Patient Re-evaluated:Patient Re-evaluated prior to induction Oxygen Delivery Method: Circle System Utilized Preoxygenation: Pre-oxygenation with 100% oxygen Induction Type: IV induction Ventilation: Oral airway inserted - appropriate to patient size Laryngoscope Size: Mac and 4 Grade View: Grade II Tube type: Oral Tube size: 7.5 mm Number of attempts: 1 Airway Equipment and Method: Stylet and Oral airway Placement Confirmation: ETT inserted through vocal cords under direct vision, positive ETCO2 and breath sounds checked- equal and bilateral Secured at: 23 cm Tube secured with: Tape Dental Injury: Teeth and Oropharynx as per pre-operative assessment  Comments: Atraumatic induction/intubation. Dentition and oral mucosa as per preop.

## 2024-02-08 NOTE — Anesthesia Preprocedure Evaluation (Addendum)
 Anesthesia Evaluation  Patient identified by MRN, date of birth, ID band Patient awake    Reviewed: Allergy & Precautions, NPO status , Patient's Chart, lab work & pertinent test results  Airway Mallampati: II  TM Distance: >3 FB Neck ROM: Full    Dental  (+) Edentulous Upper, Edentulous Lower   Pulmonary former smoker   Pulmonary exam normal        Cardiovascular hypertension, Pt. on home beta blockers and Pt. on medications Normal cardiovascular exam  S/p AVR   Neuro/Psych    GI/Hepatic negative GI ROS, Neg liver ROS,,,  Endo/Other  negative endocrine ROS    Renal/GU Renal InsufficiencyRenal disease     Musculoskeletal   Abdominal  (+) + obese  Peds  Hematology  (+) Blood dyscrasia (Coumadin ) INR: 1.4   Anesthesia Other Findings Disc displacement, lumbar  Reproductive/Obstetrics                              Anesthesia Physical Anesthesia Plan  ASA: 3  Anesthesia Plan: General   Post-op Pain Management:    Induction: Intravenous  PONV Risk Score and Plan: 2 and Ondansetron , Dexamethasone  and Treatment may vary due to age or medical condition  Airway Management Planned: Oral ETT  Additional Equipment:   Intra-op Plan:   Post-operative Plan: Extubation in OR  Informed Consent: I have reviewed the patients History and Physical, chart, labs and discussed the procedure including the risks, benefits and alternatives for the proposed anesthesia with the patient or authorized representative who has indicated his/her understanding and acceptance.     Dental advisory given  Plan Discussed with: CRNA  Anesthesia Plan Comments:          Anesthesia Quick Evaluation

## 2024-02-08 NOTE — Progress Notes (Signed)
 Attempted to call report x2, unsuccessful. Sending transport for OR.

## 2024-02-08 NOTE — Progress Notes (Signed)
 Patient back to the unit from PACU.  Incision site on lower back c/d/I with dermabond.  Pt requesting prn tylenol  for back soreness.  All needs met.

## 2024-02-08 NOTE — Op Note (Signed)
 02/08/2024  5:15 PM  PATIENT:  Stephen Moses  78 y.o. male With an hnp at L4/5 on the left.  PRE-OPERATIVE DIAGNOSIS:  Disc displacement, lumbar  POST-OPERATIVE DIAGNOSIS:  Disc displacement, lumbar  PROCEDURE:  Procedures: LUMBAR LAMINECTOMY/DECOMPRESSION MICRODISCECTOMY 1 LEVEL  SURGEON:   Surgeon(s): Gillie Duncans, MD Darnella Dorn SAUNDERS, MD  ASSISTANTS:Garst, Dorn  ANESTHESIA:   general  EBL:  Total I/O In: 850 [I.V.:800; IV Piggyback:50] Out: 50 [Blood:50]  BLOOD ADMINISTERED:none  CELL SAVER GIVEN:not used  COUNT:per nursing  DRAINS: none   SPECIMEN:  No Specimen  DICTATION: [name] was taken to the operating room, intubated and placed under a general anesthetic without difficulty. He was positioned prone on a Wilson frame with all pressure points padded. His back was prepped and draped in a sterile manner. I opened the skin with a 10 blade and carried the dissection down to the thoracolumbar fascia. I used both sharp dissection and the monopolar cautery to expose the lamina of L4, and L5. I confirmed my location with an intraoperative xray.  I used the drill, Kerrison punches, and curettes to perform a semihemilaminectomy of L4. I used the punches to remove the ligamentum flavum to expose the thecal sac. I brought the microscope into the operative field and with Dr.Garst's assistance we started our decompression of the spinal canal, thecal sac and L5 root(s). I cauterized epidural veins overlying the disc space then divided them sharply. I opened the disc space with a 15 blade and proceeded with the discectomy. I used pituitary rongeurs, curettes, and other instruments to remove disc material. After the discectomy was completed I inspected the L5 nerve root and felt it was well decompressed. I explored rostrally, laterally, medially, and caudally and was satisfied with the decompression. I irrigated the wound, then closed in layers. I approximated the thoracolumbar  fascia, subcutaneous, and subcuticular planes with vicryl sutures. I used dermabond for a sterile dressing.   PLAN OF CARE: Admit to inpatient   PATIENT DISPOSITION:  PACU - hemodynamically stable.   Delay start of Pharmacological VTE agent (>24hrs) due to surgical blood loss or risk of bleeding:  yes

## 2024-02-08 NOTE — Progress Notes (Signed)
 PHARMACY - ANTICOAGULATION CONSULT NOTE  Pharmacy Consult for IV heparin   Indication: warfarin PTA for AVR  Allergies[1]  Patient Measurements: Height: 5' 7 (170.2 cm) Weight: 88.9 kg (195 lb 15.8 oz) IBW/kg (Calculated) : 66.1 HEPARIN  DW (KG): 84.5  Vital Signs: Temp: 98.6 F (37 C) (12/18 0533) Temp Source: Oral (12/18 0533) BP: 104/67 (12/18 0533) Pulse Rate: 64 (12/18 0533)  Labs: Recent Labs    02/06/24 0519 02/06/24 1346 02/06/24 2146 02/07/24 0538 02/08/24 0125  HGB 13.1  --   --  12.0* 13.0  HCT 39.3  --   --  35.4* 39.2  PLT 219  --   --  204 212  LABPROT 20.5*  --   --  18.7* 17.6*  INR 1.7*  --   --  1.5* 1.4*  HEPARINUNFRC  --    < > 0.60 0.45 0.73*  CREATININE 1.80*  --   --   --   --    < > = values in this interval not displayed.    Estimated Creatinine Clearance: 36.6 mL/min (A) (by C-G formula based on SCr of 1.8 mg/dL (H)).   Medical History: Past Medical History:  Diagnosis Date   Allergy    Ascending aortic aneurysm 2010    23-mm St. Jude mechanical valve conduit and re-implantation of the coronary arteries   Bicuspid aortic valve    Calculus of kidney    Essential hypertension    Long term current use of anticoagulant    Normocytic anemia    Pancytopenia (HCC)    Paraspinal muscle spasm    Assessment: Stephen Moses is a 78 y.o. year old male presented on 02/04/2024 with plans for neurosurgery on 12/18. On warfarin prior to admission for AVR. Prior to admission regimen, 5mg  MWF and 2.5mg  all other days (total weekly dose 25mg ). Last dose prior to admission 02/03/24 6pm. Pharmacy consulted for heparin  bridge peri-op.   Date INR Anticoagulation  12/14 2.2, therapeutic   Holding heparin , holding warfarin  12/15 2.2, therapeutic  Holding heparin , holding warfarin  12/16 1.7, subtherapeutic  Holding warfarin, started heparin   12/17 1.5, subtherapeutic  Holding warfarin, on heparin   12/18 1.4, subtherapeutic Holding warfarin   Heparin   level 0.73, slightly above goal CBC stable. No issues with infusion or bleeding noted.   Goal of Therapy:  INR 2-3 Heparin  level 0.3-0.7 units/ml Monitor platelets by anticoagulation protocol: Yes   Plan:  Decrease heparin  infusion at 1000 units/hr Daily heparin  level, CBC, and monitoring for bleeding F/u plans for anticoagulation - surgery planned for 12/18  Thank you for allowing pharmacy to participate in this patient's care.  Leonor GORMAN Bash, PharmD Emergency Medicine Clinical Pharmacist 02/08/2024,8:02 AM             [1]  Allergies Allergen Reactions   Codeine Nausea And Vomiting and Palpitations   Famotidine  Other (See Comments)    constipation

## 2024-02-08 NOTE — Progress Notes (Signed)
 Patient ID: Stephen Moses, male   DOB: 02-26-1945, 78 y.o.   MRN: [id number] BP (!) 145/77   Pulse 60   Temp 98.5 F (36.9 C) (Oral)   Resp 17   Ht 5' 7 (1.702 m)   Wt 90.7 kg   SpO2 98%   BMI 31.32 kg/m  Alert and oriented x4, speech is clear and fluent Moving all extremities well Ready for OR

## 2024-02-08 NOTE — Progress Notes (Signed)
 No orders for NPO, but pt has been NPO overnight. Reports last drink was yesterday around supper time.

## 2024-02-09 ENCOUNTER — Encounter (HOSPITAL_COMMUNITY): Payer: Self-pay | Admitting: Neurosurgery

## 2024-02-09 LAB — CBC
HCT: 34.6 % — ABNORMAL LOW (ref 39.0–52.0)
Hemoglobin: 11.6 g/dL — ABNORMAL LOW (ref 13.0–17.0)
MCH: 29.8 pg (ref 26.0–34.0)
MCHC: 33.5 g/dL (ref 30.0–36.0)
MCV: 88.9 fL (ref 80.0–100.0)
Platelets: 182 K/uL (ref 150–400)
RBC: 3.89 MIL/uL — ABNORMAL LOW (ref 4.22–5.81)
RDW: 14 % (ref 11.5–15.5)
WBC: 9.9 K/uL (ref 4.0–10.5)
nRBC: 0 % (ref 0.0–0.2)

## 2024-02-09 LAB — PROTIME-INR
INR: 1.4 — ABNORMAL HIGH (ref 0.8–1.2)
Prothrombin Time: 17.4 s — ABNORMAL HIGH (ref 11.4–15.2)

## 2024-02-09 LAB — HEPARIN LEVEL (UNFRACTIONATED): Heparin Unfractionated: <0.1 [IU]/mL — ABNORMAL LOW (ref 0.30–0.70)

## 2024-02-09 MED ORDER — ORAL CARE MOUTH RINSE: 15.0000 mL | OROMUCOSAL | Status: DC | PRN

## 2024-02-09 MED ORDER — WARFARIN - PHYSICIAN DOSING INPATIENT: Freq: Every day | Status: DC

## 2024-02-09 MED ORDER — WARFARIN SODIUM 10 MG PO TABS
10.0000 mg | ORAL_TABLET | Freq: Once | ORAL | Status: AC
  Administered 2024-02-10: 10 mg via ORAL
  Filled 2024-02-09: qty 1

## 2024-02-09 MED FILL — Thrombin For Soln 5000 Unit: CUTANEOUS | Qty: 2 | Status: AC

## 2024-02-09 NOTE — Evaluation (Signed)
 Occupational Therapy Evaluation and Discharge Patient Details Name: Stephen Moses MRN: [id number] DOB: 1945-04-29 Today's Date: 02/09/2024   History of Present Illness   Pt admitted 12/14 with significant pain in his left lower extremity and he describes weakness and he has had this now for at least 6 months. LUMBAR LAMINECTOMY/DECOMPRESSION MICRODISCECTOMY 1 LEVEL on 12/18.  PMH: HTN, AVR     Clinical Impressions All education completed with pt verbalizing and/or demonstrating understanding. Prefers to rely on wife's assistance for socks. Instructed in use of reacher and long handled bath sponge. Pt is mobilizing independently and endorses only incisional burning. No further OT needs.      If plan is discharge home, recommend the following:   Assistance with cooking/housework     Functional Status Assessment   Patient has not had a recent decline in their functional status     Equipment Recommendations   None recommended by OT     Recommendations for Other Services         Precautions/Restrictions   Precautions Precautions: Fall;Back Precaution Booklet Issued: Yes (comment) Recall of Precautions/Restrictions: Intact Precaution/Restrictions Comments: educated in back precautions related to ADLs and IADLs Restrictions Weight Bearing Restrictions Per Provider Order: No     Mobility Bed Mobility Overal bed mobility: Modified Independent             General bed mobility comments: used log roll technique    Transfers Overall transfer level: Independent Equipment used: None                      Balance                                           ADL either performed or assessed with clinical judgement   ADL Overall ADL's : Needs assistance/impaired Eating/Feeding: Independent;Sitting   Grooming: Wash/dry hands;Standing;Independent Grooming Details (indicate cue type and reason): educated to avoid bending over sink to  wash face, pt with dentures Upper Body Bathing: Modified independent Upper Body Bathing Details (indicate cue type and reason): uses a long handled bath sponge on his back at baseline Lower Body Bathing: Minimal assistance;Sit to/from stand Lower Body Bathing Details (indicate cue type and reason): instructed to use long handled bath sponge and dangle towel to dry feet Upper Body Dressing : Independent   Lower Body Dressing: Moderate assistance;Sit to/from stand Lower Body Dressing Details (indicate cue type and reason): assist for socks, will rely on wife until back precautions are lifted as he did prior to admission, educated in use of reacher to doff socks and start pants over feet, pt able to complete partial figure 4 Toilet Transfer: Independent   Toileting- Clothing Manipulation and Hygiene: Modified independent Toileting - Clothing Manipulation Details (indicate cue type and reason): instructed to avoid twisting with pericare     Functional mobility during ADLs: Independent       Vision         Perception         Praxis         Pertinent Vitals/Pain Pain Assessment Pain Assessment: Faces Faces Pain Scale: Hurts a little bit Pain Location: back Pain Descriptors / Indicators: Burning Pain Intervention(s): Monitored during session, Repositioned     Extremity/Trunk Assessment Upper Extremity Assessment Upper Extremity Assessment: Right hand dominant;RUE deficits/detail RUE Deficits / Details: longstanding shoulder deficits s/p rotator cuff sx RUE  Coordination: decreased gross motor   Lower Extremity Assessment Lower Extremity Assessment: Defer to PT evaluation       Communication Communication Communication: Impaired Factors Affecting Communication: Hearing impaired   Cognition Arousal: Alert Behavior During Therapy: WFL for tasks assessed/performed Cognition: No apparent impairments                               Following commands: Intact        Cueing  General Comments   Cueing Techniques: Verbal cues      Exercises     Shoulder Instructions      Home Living Family/patient expects to be discharged to:: Private residence Living Arrangements: Spouse/significant other Available Help at Discharge: Family;Available 24 hours/day Type of Home: House       Home Layout: One level     Bathroom Shower/Tub: Chief Strategy Officer: Handicapped height     Home Equipment: Cane - single point;Hand held shower head   Additional Comments: Pt has bathtub with HHShowerhead, handicapped toilet      Prior Functioning/Environment Prior Level of Function : Independent/Modified Independent;Driving               ADLs Comments: likes to work in his yard    OT Problem List:     OT Treatment/Interventions:        OT Goals(Current goals can be found in the care plan section)       OT Frequency:       Co-evaluation              AM-PAC OT 6 Clicks Daily Activity     Outcome Measure                 End of Session Equipment Utilized During Treatment: Gait belt  Activity Tolerance: Patient tolerated treatment well Patient left: in chair;with call bell/phone within reach  OT Visit Diagnosis: Pain                Time: 1132-1157 OT Time Calculation (min): 25 min Charges:  OT General Charges $OT Visit: 1 Visit OT Evaluation $OT Eval Low Complexity: 1 Low OT Treatments $Self Care/Home Management : 8-22 mins [name], OTR/L Acute Rehabilitation Services Office: [phone]  [name] 02/09/2024, 2:56 PM

## 2024-02-09 NOTE — Progress Notes (Signed)
 PHARMACY - ANTICOAGULATION CONSULT NOTE  Pharmacy Consult for IV heparin   Indication: warfarin PTA for AVR  Allergies[1]  Patient Measurements: Height: 5' 7 (170.2 cm) Weight: 90.7 kg (200 lb) IBW/kg (Calculated) : 66.1 HEPARIN  DW (KG): 85.1  Vital Signs: Temp: 97.6 F (36.4 C) (12/19 1828) Temp Source: Oral (12/19 1828) BP: 141/77 (12/19 1828) Pulse Rate: 56 (12/19 1828)  Labs: Recent Labs    02/07/24 0538 02/08/24 0125 02/09/24 0215  HGB 12.0* 13.0 11.6*  HCT 35.4* 39.2 34.6*  PLT 204 212 182  LABPROT 18.7* 17.6* 17.4*  INR 1.5* 1.4* 1.4*  HEPARINUNFRC 0.45 0.73* <0.10*    Estimated Creatinine Clearance: 36.9 mL/min (A) (by C-G formula based on SCr of 1.8 mg/dL (H)).   Medical History: Past Medical History:  Diagnosis Date   Allergy    Ascending aortic aneurysm 2010    23-mm St. Jude mechanical valve conduit and re-implantation of the coronary arteries   Bicuspid aortic valve    Calculus of kidney    Essential hypertension    Long term current use of anticoagulant    Normocytic anemia    Pancytopenia (HCC)    Paraspinal muscle spasm    Assessment: Stephen Moses is a 78 y.o. year old male presented on 02/04/2024 with plans for neurosurgery on 12/18. On warfarin prior to admission for AVR. Prior to admission regimen, 5mg  MWF and 2.5mg  all other days (total weekly dose 25mg ). Last dose prior to admission 02/03/24 6pm.  S/p surgery 12/28. Neurosurgery plans to resume anticoagulation on 12/20 - Dr. Gillie ordered warfarin 10mg  to be given 12/20 at 20:00 and IV heparin  per Pharmacy to start 12/20 at 15:00 without a bolus.  Last heparin  level was 0.73, slightly supratherapeutic on 1200 units/hr. When heparin  resumes 12/20, will start at a lower rate.  Goal of Therapy:  INR 2-3 Heparin  level 0.3-0.7 units/ml Monitor platelets by anticoagulation protocol: Yes   Plan: on 12/20 at 15:00 Resume heparin  infusion at 1000 units/hr Warfarin 10mg  per MD on 12/20  at 20:00 Daily heparin  level, CBC, INR and monitoring for bleeding  Thank you for allowing pharmacy to participate in this patient's care.  Rocky Slade, PharmD, BCPS 02/09/2024,7:35 PM  Please check AMION for all [facility] Pharmacy phone numbers After 10:00 PM, call Main Pharmacy [phone]               [1]  Allergies Allergen Reactions   Codeine Nausea And Vomiting and Palpitations   Famotidine  Other (See Comments)    constipation

## 2024-02-09 NOTE — Plan of Care (Signed)
 Pt doing well with ambulation in the hallway without assistance and has a steady gait. Minimal pain with the Hydrocodone  that is prescribed. Using IS well, up to 2400.

## 2024-02-09 NOTE — Progress Notes (Signed)
 Pt is doing well, sitting up in chair and has ambulated independently in the hallways several times today. Gave him instruction on IS and he performed it excellently 5 times with correct technique, up to 2400.  Pt states he feels good ambulating independently.

## 2024-02-09 NOTE — Evaluation (Signed)
 Physical Therapy Brief Evaluation and Discharge Note Patient Details Name: Stephen Moses MRN: [id number] DOB: 1945/06/18 Today's Date: 02/09/2024   History of Present Illness  Pt admitted 12/14 with significant pain in his left lower extremity and he describes weakness and he has had this now for at least 6 months. LUMBAR LAMINECTOMY/DECOMPRESSION MICRODISCECTOMY 1 LEVEL on 12/18.  PMH: HTN, AVR  Clinical Impression  Pt admitted with above diagnosis. Pt was able to demonstrate all aspects of mobility at independent level.  Educated regarding back precautions as well.  Pt with no further skilled PT needs.Will sign off.    PT Assessment   Assistance Needed at Discharge  None    Equipment Recommendations None recommended by PT  Recommendations for Other Services       Precautions/Restrictions Precautions Precautions: Fall;Back Precaution Booklet Issued: Yes (comment) Recall of Precautions/Restrictions: Intact Required Braces or Orthoses:  (No brace needed per MD) Restrictions Weight Bearing Restrictions Per Provider Order: No        Mobility  Bed Mobility Rolling: Independent     General bed mobility comments: Pt was ambulating in hallway on arrival to floor.  Walked with pt and then returned to room, dicussed back precautions and practiced sit to stand and bed mobility.  Transfers Overall transfer level: Independent                      Ambulation/Gait Ambulation/Gait assistance: Independent Gait Distance (Feet): 800 Feet Assistive device: None Gait Pattern/deviations: WFL(Within Functional Limits) Gait Speed: Pace WFL General Gait Details: No LOB with gait. Can withstand challenges.  Home Activity Instructions    Stairs Stairs: Yes Stairs assistance: Independent Stair Management: No rails, Forwards, Step to pattern Number of Stairs: 2 General stair comments: No issues with up and down steps.  Modified Rankin (Stroke Patients Only)         Balance Overall balance assessment: Independent                        Pertinent Vitals/Pain PT - Brief Vital Signs All Vital Signs Stable: Yes Pain Assessment Pain Assessment: Faces Faces Pain Scale: Hurts a little bit Pain Location: back Pain Descriptors / Indicators: Aching, Discomfort Pain Intervention(s): Limited activity within patient's tolerance, Monitored during session, Repositioned     Home Living Family/patient expects to be discharged to:: Private residence Living Arrangements: Spouse/significant other Available Help at Discharge: Family;Available 24 hours/day Home Environment: Stairs to enter;No rail  Stairs-Number of Steps: 2 Home Equipment: Cane - single point;Hand held shower head   Additional Comments: Pt has bathtub with HHShowerhead, handicapped toilet    Prior Function Level of Independence: Independent      UE/LE Assessment   UE ROM/Strength/Tone/Coordination: WFL    LE ROM/Strength/Tone/Coordination: [facility]      Communication   Communication Communication: No apparent difficulties     Cognition Overall Cognitive Status: Appears within functional limits for tasks assessed/performed       General Comments      Exercises     Assessment/Plan    PT Problem List Decreased mobility;Decreased balance;Decreased activity tolerance;Decreased knowledge of precautions       PT Visit Diagnosis Muscle weakness (generalized) (M62.81)    No Skilled PT     Co-evaluation                AMPAC 6 Clicks Help needed turning from your back to your side while in a flat bed without using bedrails?: None Help  needed moving from lying on your back to sitting on the side of a flat bed without using bedrails?: None Help needed moving to and from a bed to a chair (including a wheelchair)?: None Help needed standing up from a chair using your arms (e.g., wheelchair or bedside chair)?: None Help needed to walk in hospital room?: None Help  needed climbing 3-5 steps with a railing? : None 6 Click Score: 24      End of Session Equipment Utilized During Treatment: Gait belt Activity Tolerance: Patient tolerated treatment well Patient left: in chair;with call bell/phone within reach Nurse Communication: Mobility status PT Visit Diagnosis: Muscle weakness (generalized) (M62.81)     Time: 8943-8884 PT Time Calculation (min) (ACUTE ONLY): 19 min  Charges:   PT Evaluation $PT Eval Low Complexity: 1 Low      Frieda Arnall M,PT Acute Rehab Services [phone]   Stephane JULIANNA Bevel  02/09/2024, 1:52 PM

## 2024-02-09 NOTE — Progress Notes (Signed)
 Patient ID: Stephen Moses, male   DOB: 05-Dec-1945, 78 y.o.   MRN: [id number] Alert and oriented x a4 Speech is clear and fluent Moving all extremities well Restart coumadin  tomorrow, restart heparin  tomorrow Wound is clean, dry, no signs of infection

## 2024-02-09 NOTE — Anesthesia Postprocedure Evaluation (Signed)
"   Anesthesia Post Note  Patient: Elsie MARLA Finder  Procedure(s) Performed: LUMBAR LAMINECTOMY/DECOMPRESSION MICRODISCECTOMY 1 LEVEL (Left)     Patient location during evaluation: PACU Anesthesia Type: General Level of consciousness: awake Pain management: pain level controlled Vital Signs Assessment: post-procedure vital signs reviewed and stable Respiratory status: spontaneous breathing, nonlabored ventilation and respiratory function stable Cardiovascular status: blood pressure returned to baseline and stable Postop Assessment: no apparent nausea or vomiting Anesthetic complications: no   No notable events documented.  Last Vitals:  Vitals:   02/09/24 0556 02/09/24 0921  BP: 126/72 116/60  Pulse: (!) 53 64  Resp: 16 16  Temp: 36.7 C 36.7 C  SpO2: 96% 98%    Last Pain:  Vitals:   02/09/24 1225  TempSrc:   PainSc: 7                  Korene Dula P Tyrese Ficek      "

## 2024-02-10 LAB — CBC
HCT: 33.7 % — ABNORMAL LOW (ref 39.0–52.0)
Hemoglobin: 11.5 g/dL — ABNORMAL LOW (ref 13.0–17.0)
MCH: 30 pg (ref 26.0–34.0)
MCHC: 34.1 g/dL (ref 30.0–36.0)
MCV: 88 fL (ref 80.0–100.0)
Platelets: 158 K/uL (ref 150–400)
RBC: 3.83 MIL/uL — ABNORMAL LOW (ref 4.22–5.81)
RDW: 14.2 % (ref 11.5–15.5)
WBC: 11.5 K/uL — ABNORMAL HIGH (ref 4.0–10.5)
nRBC: 0 % (ref 0.0–0.2)

## 2024-02-10 LAB — PROTIME-INR
INR: 1.3 — ABNORMAL HIGH (ref 0.8–1.2)
Prothrombin Time: 17.2 s — ABNORMAL HIGH (ref 11.4–15.2)

## 2024-02-10 MED ORDER — HEPARIN (PORCINE) 25000 UT/250ML-% IV SOLN
1150.0000 [IU]/h | INTRAVENOUS | Status: AC
  Administered 2024-02-10: 1000 [IU]/h via INTRAVENOUS
  Administered 2024-02-11: 1150 [IU]/h via INTRAVENOUS
  Filled 2024-02-10 (×3): qty 250

## 2024-02-10 NOTE — Progress Notes (Signed)
 Pt had a bowel movement few hours after soap suds enema. No other complaints at this time. Will continue to monitor.

## 2024-02-10 NOTE — Progress Notes (Signed)
 Patient ID: Stephen Moses, male   DOB: Oct 05, 1945, 78 y.o.   MRN: [id number] Patient is status post lumbar decompression L4-L5.  He requires anticoagulation with Coumadin  Dr. Cabbell has planned that he should start heparin  anticoagulation tomorrow followed by Coumadin .  He otherwise feels ready to go home but given the fact that he has a mechanical heart valve we will maintain his presence here until he is resumed on his full anticoagulation schedule.

## 2024-02-10 NOTE — Progress Notes (Signed)
 PHARMACY - ANTICOAGULATION CONSULT NOTE  Pharmacy Consult for IV heparin   Indication: warfarin PTA for AVR  Allergies[1]  Patient Measurements: Height: 5' 7 (170.2 cm) Weight: 90.7 kg (200 lb) IBW/kg (Calculated) : 66.1 HEPARIN  DW (KG): 85.1  Vital Signs: Temp: 97.8 F (36.6 C) (12/20 0552) Temp Source: Oral (12/20 0552) BP: 110/59 (12/20 0552) Pulse Rate: 52 (12/20 0552)  Labs: Recent Labs    02/08/24 0125 02/09/24 0215 02/10/24 0627  HGB 13.0 11.6* 11.5*  HCT 39.2 34.6* 33.7*  PLT 212 182 158  LABPROT 17.6* 17.4* 17.2*  INR 1.4* 1.4* 1.3*  HEPARINUNFRC 0.73* <0.10*  --     Estimated Creatinine Clearance: 36.9 mL/min (A) (by C-G formula based on SCr of 1.8 mg/dL (H)).   Medical History: Past Medical History:  Diagnosis Date   Allergy    Ascending aortic aneurysm 2010    23-mm St. Jude mechanical valve conduit and re-implantation of the coronary arteries   Bicuspid aortic valve    Calculus of kidney    Essential hypertension    Long term current use of anticoagulant    Normocytic anemia    Pancytopenia (HCC)    Paraspinal muscle spasm    Assessment: Stephen Moses is a 78 y.o. year old male presented on 02/04/2024 and s/p mumbar laminectomy/decompression on 12/18 with neurosurgery. On warfarin prior to admission for AVR. Prior to admission regimen, 5mg  MWF and 2.5mg  all other days (total weekly dose 25mg ). Last dose prior to admission 02/03/24 6pm.  S/p surgery 12/18. Neurosurgery plans to resume anticoagulation on today (12/20) - Dr. Gillie ordered warfarin 10mg  to be given 12/20 at 20:00 and IV heparin  per Pharmacy to start 12/20 at 15:00 without a bolus.  Last heparin  level was 0.73, slightly supratherapeutic on 1200 units/hr. Hemoglobin 11.5, platelets 158, INR subtherapeutic at 1.3. Will start heparin  today at 15:00 at a lower rate without a bolus.   Goal of Therapy:  INR 2-3 Heparin  level 0.3-0.7 units/ml Monitor platelets by anticoagulation  protocol: Yes   Plan: on 12/20 at 15:00 Resume heparin  infusion at 1000 units/hr Check heparin  level 8 hours from starting infusion Warfarin 10mg  per MD on 12/20 at 20:00 Daily heparin  level, CBC, INR and monitoring for bleeding  Thank you for allowing pharmacy to participate in this patient's care.  Feliciano Close, PharmD PGY2 Infectious Diseases Pharmacy Resident     [1]  Allergies Allergen Reactions   Codeine Nausea And Vomiting and Palpitations   Famotidine  Other (See Comments)    constipation

## 2024-02-11 LAB — CBC
HCT: 33.8 % — ABNORMAL LOW (ref 39.0–52.0)
HCT: 35.1 % — ABNORMAL LOW (ref 39.0–52.0)
Hemoglobin: 11.3 g/dL — ABNORMAL LOW (ref 13.0–17.0)
Hemoglobin: 11.9 g/dL — ABNORMAL LOW (ref 13.0–17.0)
MCH: 29.5 pg (ref 26.0–34.0)
MCH: 29.6 pg (ref 26.0–34.0)
MCHC: 33.4 g/dL (ref 30.0–36.0)
MCHC: 33.9 g/dL (ref 30.0–36.0)
MCV: 87.3 fL (ref 80.0–100.0)
MCV: 88.3 fL (ref 80.0–100.0)
Platelets: 164 K/uL (ref 150–400)
Platelets: 183 K/uL (ref 150–400)
RBC: 3.83 MIL/uL — ABNORMAL LOW (ref 4.22–5.81)
RBC: 4.02 MIL/uL — ABNORMAL LOW (ref 4.22–5.81)
RDW: 14.3 % (ref 11.5–15.5)
RDW: 14.3 % (ref 11.5–15.5)
WBC: 12.2 K/uL — ABNORMAL HIGH (ref 4.0–10.5)
WBC: 12.9 K/uL — ABNORMAL HIGH (ref 4.0–10.5)
nRBC: 0 % (ref 0.0–0.2)
nRBC: 0 % (ref 0.0–0.2)

## 2024-02-11 LAB — HEPARIN LEVEL (UNFRACTIONATED)
Heparin Unfractionated: 0.19 [IU]/mL — ABNORMAL LOW (ref 0.30–0.70)
Heparin Unfractionated: 0.3 [IU]/mL (ref 0.30–0.70)
Heparin Unfractionated: 0.51 [IU]/mL (ref 0.30–0.70)

## 2024-02-11 LAB — PROTIME-INR
INR: 1.3 — ABNORMAL HIGH (ref 0.8–1.2)
Prothrombin Time: 16.4 s — ABNORMAL HIGH (ref 11.4–15.2)

## 2024-02-11 MED ORDER — WARFARIN SODIUM 10 MG PO TABS
10.0000 mg | ORAL_TABLET | Freq: Once | ORAL | Status: AC
  Administered 2024-02-11: 10 mg via ORAL
  Filled 2024-02-11: qty 1

## 2024-02-11 NOTE — Progress Notes (Signed)
 PHARMACY - ANTICOAGULATION CONSULT NOTE  Pharmacy Consult for IV heparin   Indication: warfarin PTA for AVR  Allergies[1]  Patient Measurements: Height: 5' 7 (170.2 cm) Weight: 90.7 kg (200 lb) IBW/kg (Calculated) : 66.1 HEPARIN  DW (KG): 85.1  Vital Signs: Temp: 97.8 F (36.6 C) (12/20 2035) Temp Source: Oral (12/20 2035) BP: 128/65 (12/20 2035) Pulse Rate: 61 (12/20 2035)  Labs: Recent Labs    02/08/24 0125 02/09/24 0215 02/10/24 0627 02/10/24 2355  HGB 13.0 11.6* 11.5* 11.3*  HCT 39.2 34.6* 33.7* 33.8*  PLT 212 182 158 164  LABPROT 17.6* 17.4* 17.2* 16.4*  INR 1.4* 1.4* 1.3* 1.3*  HEPARINUNFRC 0.73* <0.10*  --  0.19*    Estimated Creatinine Clearance: 36.9 mL/min (A) (by C-G formula based on SCr of 1.8 mg/dL (H)).   Medical History: Past Medical History:  Diagnosis Date   Allergy    Ascending aortic aneurysm 2010    23-mm St. Jude mechanical valve conduit and re-implantation of the coronary arteries   Bicuspid aortic valve    Calculus of kidney    Essential hypertension    Long term current use of anticoagulant    Normocytic anemia    Pancytopenia (HCC)    Paraspinal muscle spasm    Assessment: Stephen Moses is a 78 y.o. year old male presented on 02/04/2024 and s/p mumbar laminectomy/decompression on 12/18 with neurosurgery. On warfarin prior to admission for AVR. Prior to admission regimen, 5mg  MWF and 2.5mg  all other days (total weekly dose 25mg ). Last dose prior to admission 02/03/24 6pm.  S/p surgery 12/18. Neurosurgery plans to resume anticoagulation on today (12/20) - Dr. Gillie ordered warfarin 10mg  to be given 12/20 at 20:00 and IV heparin  per Pharmacy to start 12/20 at 15:00 without a bolus.  Last heparin  level was 0.73, slightly supratherapeutic on 1200 units/hr. Hemoglobin 11.5, platelets 158, INR subtherapeutic at 1.3. Will start heparin  today at 15:00 at a lower rate without a bolus.   AM: heparin  level below goal on 1000 units/hr. Per  RN, no signs/symptoms of bleeding or issues with the heparin  gtt running continuously.   Goal of Therapy:  INR 2-3 Heparin  level 0.3-0.7 units/ml Monitor platelets by anticoagulation protocol: Yes   Plan: Increase heparin  infusion to 1150 units/hr Check heparin  level 8 hours  Warfarin per MD Daily heparin  level, CBC, INR and monitoring for bleeding  Lynwood Poplar, PharmD, BCPS Clinical Pharmacist 02/11/2024 12:35 AM       [1]  Allergies Allergen Reactions   Codeine Nausea And Vomiting and Palpitations   Famotidine  Other (See Comments)    constipation

## 2024-02-11 NOTE — Progress Notes (Signed)
 PHARMACY - ANTICOAGULATION CONSULT NOTE  Pharmacy Consult for IV heparin   Indication: warfarin PTA for AVR  Allergies[1]  Patient Measurements: Height: 5' 7 (170.2 cm) Weight: 90.7 kg (200 lb) IBW/kg (Calculated) : 66.1 HEPARIN  DW (KG): 85.1  Vital Signs:    Labs: Recent Labs    02/09/24 0215 02/10/24 0627 02/10/24 2355 02/11/24 0852  HGB 11.6* 11.5* 11.3* 11.9*  HCT 34.6* 33.7* 33.8* 35.1*  PLT 182 158 164 183  LABPROT 17.4* 17.2* 16.4*  --   INR 1.4* 1.3* 1.3*  --   HEPARINUNFRC <0.10*  --  0.19* 0.30    Estimated Creatinine Clearance: 36.9 mL/min (A) (by C-G formula based on SCr of 1.8 mg/dL (H)).   Medical History: Past Medical History:  Diagnosis Date   Allergy    Ascending aortic aneurysm 2010    23-mm St. Jude mechanical valve conduit and re-implantation of the coronary arteries   Bicuspid aortic valve    Calculus of kidney    Essential hypertension    Long term current use of anticoagulant    Normocytic anemia    Pancytopenia (HCC)    Paraspinal muscle spasm    Assessment: Stephen Moses is a 48 y.o. year old male presented on 02/04/2024 and s/p mumbar laminectomy/decompression on 12/18 with neurosurgery. On warfarin prior to admission for AVR. Prior to admission regimen, 5mg  MWF and 2.5mg  all other days (total weekly dose 25mg ). Last dose prior to admission 02/03/24 6pm. S/p surgery 12/18. Neurosurgery plans to resume anticoagulation on 12/20 - Dr. Cabbell ordered warfarin 10mg  to be given 12/20 at 20:00 and IV heparin  per Pharmacy to start 12/20 at 15:00 without a bolus.  12/21 AM: Heparin  level is therapeutic at 0.30. CBC numerically stable (hemoglobin 11.9, platelets 183) and no documented signs/symptoms of bleeding.   Goal of Therapy:  INR 2-3 Heparin  level 0.3-0.7 units/ml Monitor platelets by anticoagulation protocol: Yes   Plan: Continue heparin  infusion at 1150 units/hr Check heparin  level 8 hours  Warfarin per MD Daily heparin  level,  CBC, INR and monitoring for bleeding  Feliciano Close, PharmD PGY2 Infectious Diseases Pharmacy Resident        [1]  Allergies Allergen Reactions   Codeine Nausea And Vomiting and Palpitations   Famotidine  Other (See Comments)    constipation

## 2024-02-11 NOTE — Plan of Care (Signed)
  Problem: Health Behavior/Discharge Planning: Goal: Ability to manage health-related needs will improve Outcome: Progressing   Problem: Activity: Goal: Risk for activity intolerance will decrease Outcome: Progressing   Problem: Coping: Goal: Level of anxiety will decrease Outcome: Progressing   Problem: Pain Managment: Goal: General experience of comfort will improve and/or be controlled Outcome: Progressing   Problem: Safety: Goal: Ability to remain free from injury will improve Outcome: Progressing

## 2024-02-11 NOTE — Plan of Care (Signed)

## 2024-02-11 NOTE — Progress Notes (Signed)
 PHARMACY - ANTICOAGULATION CONSULT NOTE  Pharmacy Consult for IV heparin   Indication: warfarin PTA for AVR  Allergies[1]  Patient Measurements: Height: 5' 7 (170.2 cm) Weight: 90.7 kg (200 lb) IBW/kg (Calculated) : 66.1 HEPARIN  DW (KG): 85.1  Vital Signs: Temp: 97.7 F (36.5 C) (12/21 1105) Temp Source: Oral (12/21 1105) BP: 125/99 (12/21 1419) Pulse Rate: 60 (12/21 1105)  Labs: Recent Labs    02/09/24 0215 02/10/24 0627 02/10/24 2355 02/11/24 0852 02/11/24 1640  HGB 11.6* 11.5* 11.3* 11.9*  --   HCT 34.6* 33.7* 33.8* 35.1*  --   PLT 182 158 164 183  --   LABPROT 17.4* 17.2* 16.4*  --   --   INR 1.4* 1.3* 1.3*  --   --   HEPARINUNFRC <0.10*  --  0.19* 0.30 0.51    Estimated Creatinine Clearance: 36.9 mL/min (A) (by C-G formula based on SCr of 1.8 mg/dL (H)).   Medical History: Past Medical History:  Diagnosis Date   Allergy    Ascending aortic aneurysm 2010    23-mm St. Jude mechanical valve conduit and re-implantation of the coronary arteries   Bicuspid aortic valve    Calculus of kidney    Essential hypertension    Long term current use of anticoagulant    Normocytic anemia    Pancytopenia (HCC)    Paraspinal muscle spasm    Assessment: Stephen Moses is a 78 y.o. year old male presented on 02/04/2024 and s/p mumbar laminectomy/decompression on 12/18 with neurosurgery. On warfarin prior to admission for AVR. Prior to admission regimen, 5mg  MWF and 2.5mg  all other days (total weekly dose 25mg ). Last dose prior to admission 02/03/24 6pm. S/p surgery 12/18. Neurosurgery plans to resume anticoagulation on 12/20 - Dr. Cabbell ordered warfarin 10mg  to be given 12/20 at 20:00 and IV heparin  per Pharmacy to start 12/20 at 15:00 without a bolus.  12/21 AM: Heparin  level is therapeutic at 0.30. CBC numerically stable (hemoglobin 11.9, platelets 183) and no documented signs/symptoms of bleeding.   12/21 PM: heparin  level 0.51 is therapeutic on 1150 units/hr. No  issues with the infusion or bleeding reported.  Goal of Therapy:  INR 2-3 Heparin  level 0.3-0.7 units/ml Monitor platelets by anticoagulation protocol: Yes   Plan: Continue heparin  infusion at 1150 units/hr Warfarin per MD Daily heparin  level, CBC, INR and monitoring for bleeding  Rocky Slade, PharmD, BCPS 02/11/2024 5:35 PM  Please check AMION for all [facility] Pharmacy phone numbers After 10:00 PM, call Main Pharmacy [phone]         [1]  Allergies Allergen Reactions   Codeine Nausea And Vomiting and Palpitations   Famotidine  Other (See Comments)    constipation

## 2024-02-11 NOTE — Progress Notes (Signed)
 Patient ID: Stephen Moses, male   DOB: [birth year], 78 y.o.   MRN: [id number] BP (!) 125/99   Pulse 60   Temp 97.7 F (36.5 C) (Oral)   Resp 16   Ht 5' 7 (1.702 m)   Wt 90.7 kg   SpO2 98%   BMI 31.32 kg/m  Alert and oriented x 4, speech is clear and fluent Moving all extremities well Wound is clean, dry, no signs of infection Coumadin  started

## 2024-02-12 LAB — CBC
HCT: 39 % (ref 39.0–52.0)
Hemoglobin: 12.9 g/dL — ABNORMAL LOW (ref 13.0–17.0)
MCH: 29.5 pg (ref 26.0–34.0)
MCHC: 33.1 g/dL (ref 30.0–36.0)
MCV: 89 fL (ref 80.0–100.0)
Platelets: 226 K/uL (ref 150–400)
RBC: 4.38 MIL/uL (ref 4.22–5.81)
RDW: 14.6 % (ref 11.5–15.5)
WBC: 16.9 K/uL — ABNORMAL HIGH (ref 4.0–10.5)
nRBC: 0 % (ref 0.0–0.2)

## 2024-02-12 LAB — PROTIME-INR
INR: 1.5 — ABNORMAL HIGH (ref 0.8–1.2)
Prothrombin Time: 18.7 s — ABNORMAL HIGH (ref 11.4–15.2)

## 2024-02-12 LAB — HEPARIN LEVEL (UNFRACTIONATED): Heparin Unfractionated: 0.5 [IU]/mL (ref 0.30–0.70)

## 2024-02-12 MED ORDER — WARFARIN SODIUM 7.5 MG PO TABS
7.5000 mg | ORAL_TABLET | Freq: Once | ORAL | Status: AC
  Administered 2024-02-12: 7.5 mg via ORAL
  Filled 2024-02-12: qty 1

## 2024-02-12 MED ORDER — WARFARIN - PHYSICIAN DOSING INPATIENT: Freq: Every day | Status: DC

## 2024-02-12 NOTE — Progress Notes (Signed)
 Soap suds enema given to pt per SWOT nurse Tammy

## 2024-02-12 NOTE — Progress Notes (Signed)
 Patient ID: Stephen Moses, male   DOB: [birth year], 78 y.o.   MRN: [id number] BP (!) 144/70   Pulse 65   Temp 97.7 F (36.5 C) (Oral)   Resp 17   Ht 5' 7 (1.702 m)   Wt 90.7 kg   SpO2 95%   BMI 31.32 kg/m  Moving all extremities well. If inr is 1.7 or more tomorrow will discharge. Will give 7.5mg  today.  Doing well overall Moving all extremities well. Normal bowel and bladder function.

## 2024-02-12 NOTE — Progress Notes (Signed)
" °   02/12/24 1437  Vitals  BP (!) 144/70  MAP (mmHg) 76  BP Method Automatic  Patient Position (if appropriate) Sitting  Pulse Rate Source Dinamap  Level of Consciousness  Level of Consciousness Alert  MEWS COLOR  MEWS Score Color Green  Oxygen Therapy  SpO2 95 %  O2 Device Room Air  Pain Assessment  Pain Scale 0-10  Pain Score 2  MEWS Score  MEWS Temp 0  MEWS Systolic 0  MEWS Pulse 0  MEWS RR 0  MEWS LOC 0  MEWS Score 0    "

## 2024-02-12 NOTE — Progress Notes (Signed)
 PHARMACY - ANTICOAGULATION CONSULT NOTE  Pharmacy Consult for IV heparin   Indication: warfarin PTA for AVR  Allergies[1]  Patient Measurements: Height: 5' 7 (170.2 cm) Weight: 90.7 kg (200 lb) IBW/kg (Calculated) : 66.1 HEPARIN  DW (KG): 85.1  Vital Signs: Temp: 97.7 F (36.5 C) (12/22 0936) Temp Source: Oral (12/22 0936) BP: 117/70 (12/22 0936) Pulse Rate: 65 (12/22 0936)  Labs: Recent Labs    02/10/24 0627 02/10/24 0627 02/10/24 2355 02/11/24 0852 02/11/24 1640 02/12/24 0504  HGB 11.5*  --  11.3* 11.9*  --  12.9*  HCT 33.7*  --  33.8* 35.1*  --  39.0  PLT 158  --  164 183  --  226  LABPROT 17.2*  --  16.4*  --   --  18.7*  INR 1.3*  --  1.3*  --   --  1.5*  HEPARINUNFRC  --    < > 0.19* 0.30 0.51 0.50   < > = values in this interval not displayed.    Estimated Creatinine Clearance: 36.9 mL/min (A) (by C-G formula based on SCr of 1.8 mg/dL (H)).   Medical History: Past Medical History:  Diagnosis Date   Allergy    Ascending aortic aneurysm 2010    23-mm St. Jude mechanical valve conduit and re-implantation of the coronary arteries   Bicuspid aortic valve    Calculus of kidney    Essential hypertension    Long term current use of anticoagulant    Normocytic anemia    Pancytopenia (HCC)    Paraspinal muscle spasm    Assessment: 78 y.o. year old male presented on 02/04/2024 and s/p mumbar laminectomy/decompression on 12/18 with neurosurgery. On warfarin prior to admission for AVR. Prior to admission regimen, 5mg  MWF and 2.5mg  all other days (total weekly dose 25mg ). Last dose prior to admission 02/03/24 @1800 . S/p surgery 12/18. Neurosurgery resumed warfarin on 12/20.  IV heparin  per Pharmacy to start 12/20 at 15:00 without a bolus while INR subtherapeutic.   Heparin  level therapeutic x2 on current rate of 1150 units/hr. No issues with the infusion or bleeding reported. CBC remains stable, platelets 226.   Goal of Therapy:  INR 2-3 Heparin  level 0.3-0.7  units/ml Monitor platelets by anticoagulation protocol: Yes   Plan: Continue heparin  infusion at 1150 units/hr Check heparin  level daily while on heparin  Continue to monitor H&H and platelets Warfarin per MD, f/u INR - DC heparin  once therapeutic  Thank you for allowing pharmacy to be a part of this patients care.  Shelba Collier, PharmD, BCPS Clinical Pharmacist    [1]  Allergies Allergen Reactions   Codeine Nausea And Vomiting and Palpitations   Famotidine  Other (See Comments)    constipation

## 2024-02-12 NOTE — Progress Notes (Signed)
 Pt stated that he would like to wait on the soap sud enema this morning

## 2024-02-12 NOTE — Progress Notes (Signed)
" °   02/12/24 1224  Gastrointestinal  Gastrointestinal (WDL) X  Last BM Date  02/12/24  Stool Measurement/Characteristics  Bowel Incontinence No  Enema  Enema Type Soap suds    "

## 2024-02-13 LAB — HEPARIN LEVEL (UNFRACTIONATED): Heparin Unfractionated: 0.33 [IU]/mL (ref 0.30–0.70)

## 2024-02-13 LAB — PROTIME-INR
INR: 2.3 — ABNORMAL HIGH (ref 0.8–1.2)
Prothrombin Time: 26.3 s — ABNORMAL HIGH (ref 11.4–15.2)

## 2024-02-13 LAB — CBC
HCT: 33.6 % — ABNORMAL LOW (ref 39.0–52.0)
Hemoglobin: 11.2 g/dL — ABNORMAL LOW (ref 13.0–17.0)
MCH: 29.9 pg (ref 26.0–34.0)
MCHC: 33.3 g/dL (ref 30.0–36.0)
MCV: 89.6 fL (ref 80.0–100.0)
Platelets: 185 K/uL (ref 150–400)
RBC: 3.75 MIL/uL — ABNORMAL LOW (ref 4.22–5.81)
RDW: 14.6 % (ref 11.5–15.5)
WBC: 12.9 K/uL — ABNORMAL HIGH (ref 4.0–10.5)
nRBC: 0 % (ref 0.0–0.2)

## 2024-02-13 MED ORDER — HYDROCODONE-ACETAMINOPHEN 5-325 MG PO TABS: 1.0000 | ORAL_TABLET | Freq: Four times a day (QID) | ORAL | 0 refills | Status: AC | PRN

## 2024-02-13 NOTE — TOC Transition Note (Signed)
 Transition of Care [facility]) - Discharge Note   Patient Details  Name: Stephen Moses MRN: [id number] Date of Birth: 21-Jul-1945  Transition of Care [facility]) CM/SW Contact:  Andrez JULIANNA George, RN Phone Number: 02/13/2024, 1:46 PM   Clinical Narrative:     Pt is discharging home with self care. No follow up per therapies.  Pt has transport home.  Final next level of care: Home/Self Care Barriers to Discharge: No Barriers Identified   Patient Goals and CMS Choice            Discharge Placement                       Discharge Plan and Services Additional resources added to the After Visit Summary for                                       Social Drivers of Health (SDOH) Interventions SDOH Screenings   Food Insecurity: No Food Insecurity (02/11/2024)  Housing: Unknown (02/11/2024)  Transportation Needs: No Transportation Needs (02/04/2024)  Utilities: Not At Risk (02/04/2024)  Social Connections: Socially Integrated (02/04/2024)  Tobacco Use: Medium Risk (02/08/2024)     Readmission Risk Interventions     No data to display

## 2024-02-13 NOTE — Discharge Summary (Signed)
 Physician Discharge Summary  Patient ID: Stephen Moses MRN: [id number] DOB/AGE: 28-Jul-1945 78 y.o.  Admit date: 02/04/2024 Discharge date: 02/13/2024  Admission Diagnoses:hnp left L4/5  Discharge Diagnoses: same Principal Problem:   Lumbar disc herniation with radiculopathy Active Problems:   HNP (herniated nucleus pulposus), lumbar   Discharged Condition: good  Hospital Course: Mr. Habibi was admitted preop for a heparin  bridge due to his coumadin  treatment. He underwent an uncomplicated L4/5 discetomy on the left. His wound is clean, dry, no signs of infection. He is moving all extremities well.  Treatments: surgery: as above  Discharge Exam: Blood pressure 125/72, pulse 68, temperature 98.2 F (36.8 C), temperature source Oral, resp. rate 14, height 5' 7 (1.702 m), weight 90.7 kg, SpO2 100%. General appearance: alert, cooperative, appears stated age, and no distress  Disposition: Discharge disposition: 01-Home or Self Care      Disc displacement, lumbar  Allergies as of 02/13/2024       Reactions   Codeine Nausea And Vomiting, Palpitations   Famotidine  Other (See Comments)   constipation        Medication List     TAKE these medications    acetaminophen  500 MG tablet Commonly known as: TYLENOL  Take 1,000 mg by mouth every 6 (six) hours as needed for mild pain or moderate pain.   amLODipine  5 MG tablet Commonly known as: NORVASC  Take 5 mg by mouth daily in the afternoon.   atorvastatin  40 MG tablet Commonly known as: LIPITOR TAKE ONE (1) TABLET BY MOUTH EVERY DAY   esomeprazole  40 MG capsule Commonly known as: NEXIUM  TAKE ONE (1) CAPSULE (40 MG TOTAL) BY MOUTH TWO (2) (TWO) TIMES DAILY.   HYDROcodone -acetaminophen  5-325 MG tablet Commonly known as: NORCO/VICODIN Take 1 tablet by mouth every 6 (six) hours as needed for up to 7 days for moderate pain (pain score 4-6).   methocarbamol  500 MG tablet Commonly known as: ROBAXIN  Take 500 mg by  mouth every 8 (eight) hours as needed for muscle spasms.   metoprolol  succinate 50 MG 24 hr tablet Commonly known as: Toprol  XL Take 1 tablet (50 mg total) by mouth at bedtime. Take with or immediately following a meal.   warfarin 5 MG tablet Commonly known as: COUMADIN  Take as directed. If you are unsure how to take this medication, talk to your nurse or doctor. Original instructions: TAKE ONE (1) TABLET DAILY EXCEPT ONE HALF (1/2) TABLET ON SUNDAYS, TUESDAYS AND THURSDAYS OR AS DIRECTED What changed: See the new instructions.         SignedBETHA Rockey [place] 02/13/2024, 1:15 PM

## 2024-02-13 NOTE — Plan of Care (Signed)
   Problem: Education: Goal: Knowledge of General Education information will improve Description Including pain rating scale, medication(s)/side effects and non-pharmacologic comfort measures Outcome: Progressing

## 2024-02-13 NOTE — Progress Notes (Signed)
 Patient is reluctant to take pain medications due to fear of having constipation. Offered stool softener and explained to the patient about the medication but still refused. The patient wants the pain medication given to him if he ask to have it.

## 2024-02-13 NOTE — Progress Notes (Signed)
 PHARMACY - ANTICOAGULATION CONSULT NOTE  Pharmacy Consult for IV heparin   Indication: warfarin PTA for AVR  Allergies[1]  Patient Measurements: Height: 5' 7 (170.2 cm) Weight: 90.7 kg (200 lb) IBW/kg (Calculated) : 66.1 HEPARIN  DW (KG): 85.1  Vital Signs: Temp: 98.1 F (36.7 C) (12/23 0539) Temp Source: Oral (12/23 0539) BP: 132/67 (12/23 0539) Pulse Rate: 56 (12/23 0539)  Labs: Recent Labs    02/10/24 2355 02/11/24 0852 02/11/24 1640 02/12/24 0504 02/13/24 0344  HGB 11.3* 11.9*  --  12.9* 11.2*  HCT 33.8* 35.1*  --  39.0 33.6*  PLT 164 183  --  226 185  LABPROT 16.4*  --   --  18.7* 26.3*  INR 1.3*  --   --  1.5* 2.3*  HEPARINUNFRC 0.19* 0.30 0.51 0.50 0.33    Estimated Creatinine Clearance: 36.9 mL/min (A) (by C-G formula based on SCr of 1.8 mg/dL (H)).   Medical History: Past Medical History:  Diagnosis Date   Allergy    Ascending aortic aneurysm 2010    23-mm [employer] mechanical valve conduit and re-implantation of the coronary arteries   Bicuspid aortic valve    Calculus of kidney    Essential hypertension    Long term current use of anticoagulant    Normocytic anemia    Pancytopenia (HCC)    Paraspinal muscle spasm    Assessment: 78 y.o. year old male presented on 02/04/2024 and s/p mumbar laminectomy/decompression on 12/18 with neurosurgery. On warfarin prior to admission for AVR. Prior to admission regimen, 5mg  MWF and 2.5mg  all other days (total weekly dose 25mg ). Last dose prior to admission 02/03/24 @1800 . S/p surgery 12/18. Neurosurgery resumed warfarin on 12/20.  IV heparin  per Pharmacy to start 12/20 at 15:00 without a bolus while INR subtherapeutic.   Heparin  level therapeutic x2 on current rate of 1150 units/hr. No issues with the infusion or bleeding reported. CBC remains stable, platelets 185.   Goal of Therapy:  INR 2-3 Heparin  level 0.3-0.7 units/ml Monitor platelets by anticoagulation protocol: Yes   Plan: Stop heparin  infusion -  INR now therapeutic Continue to monitor H&H and platelets Warfarin per MD, daily INR while inpatient  Thank you for allowing pharmacy to be a part of this patients care.  Shelba Collier, PharmD, BCPS Clinical Pharmacist     [1]  Allergies Allergen Reactions   Codeine Nausea And Vomiting and Palpitations   Famotidine  Other (See Comments)    constipation

## 2024-02-13 NOTE — Discharge Instructions (Signed)
 Lumbar Discectomy Care After A discectomy involves removal of discmaterial (the cartilage-like structures located between the bones of the back). It is done to relieve pressure on nerve roots. It can be used as a treatment for a back problem. The time in surgery depends on the findings in surgery and what is necessary to correct the problems. HOME CARE INSTRUCTIONS   Check the cut (incision) made by the surgeon twice a day for signs of infection. Some signs of infection may include:   A foul smelling, greenish or yellowish discharge from the wound.   Increased pain.   Increased redness over the incision (operative) site.   The skin edges may separate.   Flu-like symptoms (problems).   A temperature above 101.5 F (38.6 C).   Change your bandages in about 24 to 36 hours following surgery or as directed.   You may shower tomrrow.  Avoid bathtubs, swimming pools and hot tubs for three weeks or until your incision has healed completely.  Follow your doctor's instructions as to safe activities, exercises, and physical therapy.   Weight reduction may be beneficial if you are overweight.   Daily exercise is helpful to prevent the return of problems. Walking is permitted. You may use a treadmill without an incline. Cut down on activities and exercise if you have discomfort. You may also go up and down stairs as much as you can tolerate.   DO NOT lift anything heavier than 10 to 15 lbs. Avoid bending or twisting at the waist. Always bend your knees when lifting.   Maintain strength and range of motion as instructed.   Do not drive for 10 days, or as directed by your doctors. You may be a passenger . Lying back in the passenger seat may be more comfortable for you. Always wear a seatbelt.   Limit your sitting in a regular chair to 20 to 30 minutes at a time. There are no limitations for sitting in a recliner. You should lie down or walk in between sitting periods.   Only take  over-the-counter or prescription medicines for pain, discomfort, or fever as directed by your caregiver.  SEEK MEDICAL CARE IF:   There is increased bleeding (more than a small spot) from the wound.   You notice redness, swelling, or increasing pain in the wound.   Pus is coming from wound.   You develop an unexplained oral temperature above 102 F (38.9 C) develops.   You notice a foul smell coming from the wound or dressing.   You have increasing pain in your wound.  SEEK IMMEDIATE MEDICAL CARE IF:   You develop a rash.   You have difficulty breathing.   You develop any allergic problems to medicines given.  Document Released: 01/13/2004 Document Revised: 01/27/2011 Document Reviewed: 05/03/2007 ExitCare Patient Information

## 2024-02-14 ENCOUNTER — Telehealth: Payer: Self-pay

## 2024-02-14 NOTE — Patient Instructions (Signed)
 Visit Information  Thank you for taking time to visit with me today. Please don't hesitate to contact me if I can be of assistance to you   SEEK MEDICAL CARE IF:  There is increased bleeding (more than a small spot) from the wound.  You notice redness, swelling, or increasing pain in the wound.  Pus is coming from wound.  You develop an unexplained oral temperature above 102 F (38.9 C) develops. You notice a foul smell coming from the wound or dressing.  You have increasing pain in your wound.   DO NOT lift anything heavier than 10 to 15 lbs. Avoid bending or twisting at the waist. Always bend your knees when lifting. Maintain strength and range of motion as instructed.  Do not drive for 10 days, or as directed by your doctors. You may be a passenger . Lying back in the passenger seat may be more comfortable for you. Always wear a seatbelt.  Limit your sitting in a regular chair to 20 to 30 minutes at a time. There are no limitations for sitting in a recliner. You should lie down or walk in between sitting periods.    Patient verbalizes understanding of instructions and care plan provided today and agrees to view in MyChart. Active MyChart status and patient understanding of how to access instructions and care plan via MyChart confirmed with patient.     The patient has been provided with contact information for the care management team and has been advised to call with any health related questions or concerns.   Please call the care guide team at [phone] if you need to cancel or reschedule your appointment.   Please call the Suicide and Crisis Lifeline: 988 if you are experiencing a Mental Health or Behavioral Health Crisis or need someone to talk to.  Tifany Hirsch J. Rea Reser RN, MSN [facility], [facility] RN Care Manager Direct Dial: [phone]  Fax: [phone] Website: [email or web address]

## 2024-02-14 NOTE — Transitions of Care (Post Inpatient/ED Visit) (Signed)
 "  02/14/2024  Name: Stephen Moses MRN: [id number] DOB: April 08, 1945  Today's TOC FU Call Status: Today's TOC FU Call Status:: Successful TOC FU Call Completed TOC FU Call Complete Date: 02/14/24  Patient's Name and Date of Birth confirmed. Name, DOB  Transition Care Management Follow-up Telephone Call Date of Discharge: 02/13/24 Discharge Facility: [facility]) Type of Discharge: Inpatient Admission Primary Inpatient Discharge Diagnosis:: Lumbar disc herniation with radiculopathy How have you been since you were released from the hospital?: Better Any questions or concerns?: No  Items Reviewed: Did you receive and understand the discharge instructions provided?: Yes Medications obtained,verified, and reconciled?: Yes (Medications Reviewed) Any new allergies since your discharge?: No Dietary orders reviewed?: Yes Type of Diet Ordered:: Heart healthy Do you have support at home?: Yes People in Home [RPT]: spouse, friend(s) Name of Support/Comfort Primary Source: Amber-spouse  Medications Reviewed Today: Medications Reviewed Today     Reviewed by Jaeshawn Silvio, RN (Case Manager) on 02/14/24 at 1137  Med List Status: <None>   Medication Order Taking? Sig Documenting Provider Last Dose Status Informant  acetaminophen  (TYLENOL ) 500 MG tablet [id number] Yes Take 1,000 mg by mouth every 6 (six) hours as needed for mild pain or moderate pain. [provider]  Active Spouse/Significant Other  amLODipine  (NORVASC ) 5 MG tablet [id number] Yes Take 5 mg by mouth daily in the afternoon. [provider]  Active Spouse/Significant Other  atorvastatin  (LIPITOR) 40 MG tablet [id number] Yes TAKE ONE (1) TABLET BY MOUTH EVERY DAY Debera Jayson MATSU, MD  Active   esomeprazole  (NEXIUM ) 40 MG capsule [id number] Yes TAKE ONE (1) CAPSULE (40 MG TOTAL) BY MOUTH TWO (2) (TWO) TIMES DAILY. Darlean Ozell NOVAK, MD  Active   HYDROcodone -acetaminophen  (NORCO/VICODIN) 5-325 MG tablet [id number] Yes  Take 1 tablet by mouth every 6 (six) hours as needed for up to 7 days for moderate pain (pain score 4-6). Cabbell, Kyle, MD  Active   methocarbamol  (ROBAXIN ) 500 MG tablet [id number] Yes Take 500 mg by mouth every 8 (eight) hours as needed for muscle spasms. [provider]  Active   metoprolol  succinate (TOPROL  XL) 50 MG 24 hr tablet [id number] Yes Take 1 tablet (50 mg total) by mouth at bedtime. Take with or immediately following a meal. Debera Jayson MATSU, MD  Active   warfarin (COUMADIN ) 5 MG tablet [id number] Yes TAKE ONE (1) TABLET DAILY EXCEPT ONE HALF (1/2) TABLET ON SUNDAYS, TUESDAYS AND THURSDAYS OR AS DIRECTED  Patient taking differently: TAKE 5mg  TABLET DAILY EXCEPT 2.5mg  ON SUNDAYS, TUESDAYS AND THURSDAYS   Debera Jayson MATSU, MD  Active             Home Care and Equipment/Supplies: Were Home Health Services Ordered?: NA Any new equipment or medical supplies ordered?: NA  Functional Questionnaire: Do you need assistance with bathing/showering or dressing?: No Do you need assistance with meal preparation?: No Do you need assistance with eating?: No Do you have difficulty maintaining continence: No Do you need assistance with getting out of bed/getting out of a chair/moving?: No Do you have difficulty managing or taking your medications?: No  Follow up appointments reviewed: PCP Follow-up appointment confirmed?: No (Patient to call PCP) MD Provider Line Number:[phone] Given: No Specialist Hospital Follow-up appointment confirmed?: Yes Date of Specialist follow-up appointment?: 03/01/24 Follow-Up Specialty Provider:: Dr. Gillie Do you need transportation to your follow-up appointment?: No Do you understand care options if your condition(s) worsen?: Yes-patient verbalized understanding  SDOH Interventions Today    Flowsheet Row Most  Recent Value  SDOH Interventions   Food Insecurity Interventions Intervention Not Indicated  Housing Interventions  Intervention Not Indicated  Transportation Interventions Intervention Not Indicated  Utilities Interventions Intervention Not Indicated    Karnisha Lefebre J. Mohid Furuya RN, MSN [facility]  [facility], [facility] RN Care Manager Direct Dial: [phone]  Fax: [phone] Website: [email or web address]   "

## 2024-02-19 ENCOUNTER — Ambulatory Visit

## 2024-02-19 ENCOUNTER — Ambulatory Visit: Attending: Cardiology | Admitting: *Deleted

## 2024-02-19 DIAGNOSIS — Z5181 Encounter for therapeutic drug level monitoring: Secondary | ICD-10-CM

## 2024-02-19 DIAGNOSIS — Z952 Presence of prosthetic heart valve: Secondary | ICD-10-CM

## 2024-02-19 LAB — POCT INR: INR: 2.8 (ref 2.0–3.0)

## 2024-02-19 NOTE — Patient Instructions (Signed)
 Continue warfarin 1/2 tablet daily except 1 tablet on Mondays, Wednesdays and Fridays Continue greens Recheck 3 wk

## 2024-02-19 NOTE — Progress Notes (Signed)
 INR-2.8 Please see anticoagulation encounter

## 2024-03-11 ENCOUNTER — Ambulatory Visit: Attending: Cardiology | Admitting: *Deleted

## 2024-03-11 DIAGNOSIS — Z5181 Encounter for therapeutic drug level monitoring: Secondary | ICD-10-CM | POA: Diagnosis not present

## 2024-03-11 DIAGNOSIS — Z952 Presence of prosthetic heart valve: Secondary | ICD-10-CM

## 2024-03-11 LAB — POCT INR: INR: 2 (ref 2.0–3.0)

## 2024-03-11 NOTE — Patient Instructions (Signed)
 Continue warfarin 1/2 tablet daily except 1 tablet on Mondays, Wednesdays and Fridays Continue greens Recheck 4 wk

## 2024-03-11 NOTE — Progress Notes (Signed)
 INR 2.0 Please see anticoagulation encounter.

## 2024-03-15 ENCOUNTER — Ambulatory Visit: Admitting: Cardiovascular Disease

## 2024-04-09 ENCOUNTER — Ambulatory Visit

## 2024-04-26 ENCOUNTER — Ambulatory Visit: Admitting: Cardiovascular Disease
